# Patient Record
Sex: Female | Born: 1945 | Race: White | Hispanic: No | Marital: Married | Smoking: Never smoker
Health system: Southern US, Community
[De-identification: ages and names within clinical notes are randomized; demographics above are authoritative.]

## PROBLEM LIST (undated history)

## (undated) DIAGNOSIS — D126 Benign neoplasm of colon, unspecified: Secondary | ICD-10-CM

## (undated) DIAGNOSIS — T7840XA Allergy, unspecified, initial encounter: Secondary | ICD-10-CM

## (undated) DIAGNOSIS — E785 Hyperlipidemia, unspecified: Secondary | ICD-10-CM

## (undated) DIAGNOSIS — M1990 Unspecified osteoarthritis, unspecified site: Secondary | ICD-10-CM

## (undated) DIAGNOSIS — H269 Unspecified cataract: Secondary | ICD-10-CM

## (undated) DIAGNOSIS — D649 Anemia, unspecified: Secondary | ICD-10-CM

## (undated) DIAGNOSIS — G4700 Insomnia, unspecified: Secondary | ICD-10-CM

## (undated) DIAGNOSIS — E559 Vitamin D deficiency, unspecified: Secondary | ICD-10-CM

## (undated) DIAGNOSIS — M19019 Primary osteoarthritis, unspecified shoulder: Secondary | ICD-10-CM

## (undated) DIAGNOSIS — K648 Other hemorrhoids: Secondary | ICD-10-CM

## (undated) HISTORY — DX: Unspecified osteoarthritis, unspecified site: M19.90

## (undated) HISTORY — DX: Hyperlipidemia, unspecified: E78.5

## (undated) HISTORY — DX: Other hemorrhoids: K64.8

## (undated) HISTORY — DX: Benign neoplasm of colon, unspecified: D12.6

## (undated) HISTORY — PX: COLONOSCOPY: SHX174

## (undated) HISTORY — DX: Primary osteoarthritis, unspecified shoulder: M19.019

## (undated) HISTORY — PX: FOOT SURGERY: SHX648

## (undated) HISTORY — DX: Anemia, unspecified: D64.9

## (undated) HISTORY — PX: WISDOM TOOTH EXTRACTION: SHX21

## (undated) HISTORY — DX: Unspecified cataract: H26.9

## (undated) HISTORY — DX: Allergy, unspecified, initial encounter: T78.40XA

## (undated) HISTORY — DX: Insomnia, unspecified: G47.00

## (undated) HISTORY — PX: OOPHORECTOMY: SHX86

## (undated) HISTORY — PX: SHOULDER ARTHROSCOPY: SHX128

## (undated) HISTORY — DX: Vitamin D deficiency, unspecified: E55.9

## (undated) HISTORY — PX: POLYPECTOMY: SHX149

---
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
## 1997-07-08 ENCOUNTER — Other Ambulatory Visit: Admission: RE | Admit: 1997-07-08 | Discharge: 1997-07-08 | Payer: Self-pay | Admitting: Obstetrics & Gynecology

## 1997-10-14 ENCOUNTER — Other Ambulatory Visit: Admission: RE | Admit: 1997-10-14 | Discharge: 1997-10-14 | Payer: Self-pay | Admitting: Obstetrics & Gynecology

## 1998-03-18 ENCOUNTER — Other Ambulatory Visit: Admission: RE | Admit: 1998-03-18 | Discharge: 1998-03-18 | Payer: Self-pay | Admitting: Obstetrics & Gynecology

## 1998-11-03 ENCOUNTER — Other Ambulatory Visit: Admission: RE | Admit: 1998-11-03 | Discharge: 1998-11-03 | Payer: Self-pay | Admitting: Obstetrics & Gynecology

## 1999-05-04 ENCOUNTER — Other Ambulatory Visit: Admission: RE | Admit: 1999-05-04 | Discharge: 1999-05-04 | Payer: Self-pay | Admitting: Obstetrics & Gynecology

## 1999-07-12 ENCOUNTER — Encounter (INDEPENDENT_AMBULATORY_CARE_PROVIDER_SITE_OTHER): Payer: Self-pay | Admitting: Specialist

## 1999-07-12 ENCOUNTER — Ambulatory Visit (HOSPITAL_COMMUNITY): Admission: RE | Admit: 1999-07-12 | Discharge: 1999-07-12 | Payer: Self-pay | Admitting: Obstetrics & Gynecology

## 1999-11-16 ENCOUNTER — Other Ambulatory Visit: Admission: RE | Admit: 1999-11-16 | Discharge: 1999-11-16 | Payer: Self-pay | Admitting: *Deleted

## 1999-11-22 ENCOUNTER — Ambulatory Visit (HOSPITAL_COMMUNITY): Admission: RE | Admit: 1999-11-22 | Discharge: 1999-11-22 | Payer: Self-pay | Admitting: Gastroenterology

## 2000-06-06 ENCOUNTER — Other Ambulatory Visit: Admission: RE | Admit: 2000-06-06 | Discharge: 2000-06-06 | Payer: Self-pay | Admitting: *Deleted

## 2001-06-05 ENCOUNTER — Other Ambulatory Visit: Admission: RE | Admit: 2001-06-05 | Discharge: 2001-06-05 | Payer: Self-pay | Admitting: Internal Medicine

## 2002-06-12 ENCOUNTER — Encounter: Admission: RE | Admit: 2002-06-12 | Discharge: 2002-06-12 | Payer: Self-pay

## 2002-06-13 ENCOUNTER — Encounter (INDEPENDENT_AMBULATORY_CARE_PROVIDER_SITE_OTHER): Payer: Self-pay | Admitting: Specialist

## 2002-06-13 ENCOUNTER — Ambulatory Visit (HOSPITAL_COMMUNITY): Admission: RE | Admit: 2002-06-13 | Discharge: 2002-06-13 | Payer: Self-pay | Admitting: Obstetrics & Gynecology

## 2003-08-14 ENCOUNTER — Other Ambulatory Visit: Admission: RE | Admit: 2003-08-14 | Discharge: 2003-08-14 | Payer: Self-pay | Admitting: Obstetrics and Gynecology

## 2005-02-10 ENCOUNTER — Ambulatory Visit: Payer: Self-pay | Admitting: Internal Medicine

## 2005-02-18 ENCOUNTER — Ambulatory Visit: Payer: Self-pay | Admitting: Internal Medicine

## 2005-03-15 ENCOUNTER — Ambulatory Visit: Payer: Self-pay | Admitting: Internal Medicine

## 2005-04-04 ENCOUNTER — Ambulatory Visit: Payer: Self-pay | Admitting: Internal Medicine

## 2005-04-18 HISTORY — PX: TOTAL ABDOMINAL HYSTERECTOMY W/ BILATERAL SALPINGOOPHORECTOMY: SHX83

## 2006-02-13 ENCOUNTER — Ambulatory Visit: Payer: Self-pay | Admitting: Internal Medicine

## 2006-02-13 LAB — CONVERTED CEMR LAB
ALT: 35 units/L (ref 0–40)
AST: 34 units/L (ref 0–37)
Albumin: 4.2 g/dL (ref 3.5–5.2)
Alkaline Phosphatase: 97 units/L (ref 39–117)
BUN: 15 mg/dL (ref 6–23)
Basophils Absolute: 0 10*3/uL (ref 0.0–0.1)
Basophils Relative: 0.5 % (ref 0.0–1.0)
CO2: 29 meq/L (ref 19–32)
Calcium: 9.6 mg/dL (ref 8.4–10.5)
Chloride: 106 meq/L (ref 96–112)
Chol/HDL Ratio, serum: 2.9
Cholesterol: 232 mg/dL (ref 0–200)
Creatinine, Ser: 1 mg/dL (ref 0.4–1.2)
Eosinophil percent: 2.5 % (ref 0.0–5.0)
GFR calc non Af Amer: 61 mL/min
Glomerular Filtration Rate, Af Am: 73 mL/min/{1.73_m2}
Glucose, Bld: 81 mg/dL (ref 70–99)
HCT: 40.9 % (ref 36.0–46.0)
HDL: 81.2 mg/dL (ref >39.0)
Hemoglobin: 13.7 g/dL (ref 12.0–15.0)
LDL DIRECT: 119.9 mg/dL
Lymphocytes Relative: 37.3 % (ref 12.0–46.0)
MCHC: 33.4 g/dL (ref 30.0–36.0)
MCV: 93.8 fL (ref 78.0–100.0)
Monocytes Absolute: 0.4 10*3/uL (ref 0.2–0.7)
Monocytes Relative: 12.8 % — ABNORMAL HIGH (ref 3.0–11.0)
Neutro Abs: 1.5 10*3/uL (ref 1.4–7.7)
Neutrophils Relative %: 46.9 % (ref 43.0–77.0)
Platelets: 283 10*3/uL (ref 150–400)
Potassium: 4.2 meq/L (ref 3.5–5.1)
RBC: 4.36 M/uL (ref 3.87–5.11)
RDW: 12.2 % (ref 11.5–14.6)
Sodium: 142 meq/L (ref 135–145)
TSH: 1.33 microintl units/mL (ref 0.35–5.50)
Total Bilirubin: 0.8 mg/dL (ref 0.3–1.2)
Total Protein: 6.8 g/dL (ref 6.0–8.3)
Triglyceride fasting, serum: 53 mg/dL (ref 0–149)
VLDL: 11 mg/dL (ref 0–40)
WBC: 3.2 10*3/uL — ABNORMAL LOW (ref 4.5–10.5)

## 2006-02-23 ENCOUNTER — Ambulatory Visit: Payer: Self-pay | Admitting: Internal Medicine

## 2006-03-30 ENCOUNTER — Ambulatory Visit: Payer: Self-pay | Admitting: Internal Medicine

## 2006-11-29 ENCOUNTER — Telehealth (INDEPENDENT_AMBULATORY_CARE_PROVIDER_SITE_OTHER): Payer: Self-pay | Admitting: *Deleted

## 2007-02-22 ENCOUNTER — Encounter: Payer: Self-pay | Admitting: Internal Medicine

## 2007-03-19 ENCOUNTER — Ambulatory Visit: Payer: Self-pay | Admitting: Internal Medicine

## 2007-03-19 LAB — CONVERTED CEMR LAB
ALT: 45 units/L — ABNORMAL HIGH (ref 0–35)
AST: 40 units/L — ABNORMAL HIGH (ref 0–37)
Albumin: 4.1 g/dL (ref 3.5–5.2)
Alkaline Phosphatase: 81 units/L (ref 39–117)
BUN: 16 mg/dL (ref 6–23)
Basophils Absolute: 0 10*3/uL (ref 0.0–0.1)
Basophils Relative: 0.3 % (ref 0.0–1.0)
Bilirubin Urine: NEGATIVE
Bilirubin, Direct: 0.1 mg/dL (ref 0.0–0.3)
CO2: 27 meq/L (ref 19–32)
Calcium: 9.7 mg/dL (ref 8.4–10.5)
Chloride: 105 meq/L (ref 96–112)
Cholesterol: 199 mg/dL (ref 0–200)
Creatinine, Ser: 1 mg/dL (ref 0.4–1.2)
Eosinophils Absolute: 0.1 10*3/uL (ref 0.0–0.6)
Eosinophils Relative: 1.8 % (ref 0.0–5.0)
GFR calc Af Amer: 73 mL/min
GFR calc non Af Amer: 60 mL/min
Glucose, Bld: 93 mg/dL (ref 70–99)
Glucose, Urine, Semiquant: 100
HCT: 38.6 % (ref 36.0–46.0)
HDL: 81.8 mg/dL (ref >39.0)
Hemoglobin: 13.3 g/dL (ref 12.0–15.0)
Ketones, urine, test strip: NEGATIVE
LDL Cholesterol: 103 mg/dL — ABNORMAL HIGH (ref 0–99)
Lymphocytes Relative: 35.8 % (ref 12.0–46.0)
MCHC: 34.4 g/dL (ref 30.0–36.0)
MCV: 94.2 fL (ref 78.0–100.0)
Monocytes Absolute: 0.5 10*3/uL (ref 0.2–0.7)
Monocytes Relative: 11.5 % — ABNORMAL HIGH (ref 3.0–11.0)
Neutro Abs: 2.1 10*3/uL (ref 1.4–7.7)
Neutrophils Relative %: 50.6 % (ref 43.0–77.0)
Nitrite: NEGATIVE
Platelets: 246 10*3/uL (ref 150–400)
Potassium: 4.5 meq/L (ref 3.5–5.1)
Protein, U semiquant: NEGATIVE
RBC: 4.09 M/uL (ref 3.87–5.11)
RDW: 12.3 % (ref 11.5–14.6)
Sodium: 141 meq/L (ref 135–145)
Specific Gravity, Urine: 1.025
TSH: 2.57 microintl units/mL (ref 0.35–5.50)
Total Bilirubin: 0.5 mg/dL (ref 0.3–1.2)
Total CHOL/HDL Ratio: 2.4
Total Protein: 6.4 g/dL (ref 6.0–8.3)
Triglycerides: 71 mg/dL (ref 0–149)
Urobilinogen, UA: 0.2
VLDL: 14 mg/dL (ref 0–40)
Vit D, 1,25-Dihydroxy: 63 (ref 30–89)
WBC Urine, dipstick: NEGATIVE
WBC: 4.2 10*3/uL — ABNORMAL LOW (ref 4.5–10.5)
pH: 6

## 2007-04-06 ENCOUNTER — Ambulatory Visit: Payer: Self-pay | Admitting: Internal Medicine

## 2007-04-06 DIAGNOSIS — R945 Abnormal results of liver function studies: Secondary | ICD-10-CM | POA: Insufficient documentation

## 2007-04-06 LAB — CONVERTED CEMR LAB
ALT: 37 units/L — ABNORMAL HIGH (ref 0–35)
AST: 29 units/L (ref 0–37)
Albumin: 4.4 g/dL (ref 3.5–5.2)
Alkaline Phosphatase: 90 units/L (ref 39–117)
Bilirubin, Direct: 0.1 mg/dL (ref 0.0–0.3)
Ceruloplasmin: 31 mg/dL (ref 21–63)
HCV Ab: NEGATIVE
Hep A IgM: NEGATIVE
Hep B C IgM: NEGATIVE
Hepatitis B Surface Ag: NEGATIVE
Indirect Bilirubin: 0.3 mg/dL (ref 0.0–0.9)
Iron: 94 ug/dL (ref 42–145)
Saturation Ratios: 26 % (ref 20–55)
TIBC: 356 ug/dL (ref 250–470)
Total Bilirubin: 0.4 mg/dL (ref 0.3–1.2)
Total Protein: 6.8 g/dL (ref 6.0–8.3)
Transferrin: 257 mg/dL (ref 212–360)
UIBC: 262 ug/dL

## 2007-04-10 ENCOUNTER — Telehealth: Payer: Self-pay | Admitting: Internal Medicine

## 2007-05-14 ENCOUNTER — Telehealth: Payer: Self-pay | Admitting: Internal Medicine

## 2007-05-15 ENCOUNTER — Ambulatory Visit: Payer: Self-pay | Admitting: Internal Medicine

## 2007-05-15 DIAGNOSIS — G4700 Insomnia, unspecified: Secondary | ICD-10-CM | POA: Insufficient documentation

## 2007-11-02 ENCOUNTER — Telehealth: Payer: Self-pay | Admitting: Internal Medicine

## 2007-11-12 ENCOUNTER — Telehealth: Payer: Self-pay | Admitting: *Deleted

## 2007-11-16 ENCOUNTER — Encounter: Payer: Self-pay | Admitting: Internal Medicine

## 2007-12-06 ENCOUNTER — Telehealth: Payer: Self-pay | Admitting: Internal Medicine

## 2007-12-10 ENCOUNTER — Ambulatory Visit: Payer: Self-pay | Admitting: Internal Medicine

## 2007-12-11 ENCOUNTER — Ambulatory Visit: Payer: Self-pay | Admitting: Internal Medicine

## 2007-12-11 DIAGNOSIS — E785 Hyperlipidemia, unspecified: Secondary | ICD-10-CM | POA: Insufficient documentation

## 2007-12-11 DIAGNOSIS — E7800 Pure hypercholesterolemia, unspecified: Secondary | ICD-10-CM | POA: Insufficient documentation

## 2007-12-11 DIAGNOSIS — N951 Menopausal and female climacteric states: Secondary | ICD-10-CM | POA: Insufficient documentation

## 2007-12-20 ENCOUNTER — Ambulatory Visit: Payer: Self-pay | Admitting: Internal Medicine

## 2007-12-20 LAB — HM COLONOSCOPY

## 2008-01-28 ENCOUNTER — Telehealth: Payer: Self-pay | Admitting: Internal Medicine

## 2008-02-07 ENCOUNTER — Ambulatory Visit: Payer: Self-pay | Admitting: Internal Medicine

## 2008-02-07 LAB — CONVERTED CEMR LAB
ALT: 28 units/L (ref 0–35)
AST: 32 units/L (ref 0–37)
Albumin: 4.1 g/dL (ref 3.5–5.2)
Alkaline Phosphatase: 79 units/L (ref 39–117)
Bilirubin, Direct: 0.2 mg/dL (ref 0.0–0.3)
CRP, High Sensitivity: <1 — ABNORMAL LOW (ref 0.00–5.00)
Cholesterol: 189 mg/dL (ref 0–200)
Estradiol: 42.1 pg/mL
HDL: 89 mg/dL (ref >39.0)
LDL Cholesterol: 89 mg/dL (ref 0–99)
Total Bilirubin: 1.1 mg/dL (ref 0.3–1.2)
Total CHOL/HDL Ratio: 2.1
Total Protein: 6.6 g/dL (ref 6.0–8.3)
Triglycerides: 54 mg/dL (ref 0–149)
VLDL: 11 mg/dL (ref 0–40)

## 2008-02-14 ENCOUNTER — Ambulatory Visit: Payer: Self-pay | Admitting: Internal Medicine

## 2008-03-03 ENCOUNTER — Encounter: Payer: Self-pay | Admitting: Internal Medicine

## 2008-03-06 ENCOUNTER — Encounter: Payer: Self-pay | Admitting: Internal Medicine

## 2008-03-06 ENCOUNTER — Telehealth: Payer: Self-pay | Admitting: Internal Medicine

## 2008-03-27 ENCOUNTER — Telehealth: Payer: Self-pay | Admitting: *Deleted

## 2008-04-22 ENCOUNTER — Telehealth: Payer: Self-pay | Admitting: Internal Medicine

## 2008-04-25 ENCOUNTER — Telehealth: Payer: Self-pay | Admitting: Internal Medicine

## 2008-04-25 DIAGNOSIS — G4700 Insomnia, unspecified: Secondary | ICD-10-CM | POA: Insufficient documentation

## 2008-05-13 ENCOUNTER — Ambulatory Visit (HOSPITAL_BASED_OUTPATIENT_CLINIC_OR_DEPARTMENT_OTHER): Admission: RE | Admit: 2008-05-13 | Discharge: 2008-05-13 | Payer: Self-pay | Admitting: Internal Medicine

## 2008-05-13 ENCOUNTER — Encounter: Payer: Self-pay | Admitting: Internal Medicine

## 2008-05-16 ENCOUNTER — Ambulatory Visit: Payer: Self-pay | Admitting: Pulmonary Disease

## 2008-05-21 ENCOUNTER — Ambulatory Visit: Payer: Self-pay | Admitting: Internal Medicine

## 2008-06-16 ENCOUNTER — Telehealth: Payer: Self-pay | Admitting: Internal Medicine

## 2008-06-26 ENCOUNTER — Telehealth: Payer: Self-pay | Admitting: Internal Medicine

## 2008-07-09 ENCOUNTER — Ambulatory Visit: Payer: Self-pay | Admitting: Internal Medicine

## 2008-08-18 ENCOUNTER — Telehealth: Payer: Self-pay | Admitting: Internal Medicine

## 2008-09-02 ENCOUNTER — Ambulatory Visit: Payer: Self-pay | Admitting: Internal Medicine

## 2008-09-02 LAB — CONVERTED CEMR LAB
ALT: 28 units/L (ref 0–35)
AST: 29 units/L (ref 0–37)
Albumin: 3.8 g/dL (ref 3.5–5.2)
Alkaline Phosphatase: 65 units/L (ref 39–117)
Bilirubin, Direct: 0 mg/dL (ref 0.0–0.3)
Cholesterol: 216 mg/dL — ABNORMAL HIGH (ref 0–200)
Direct LDL: 116 mg/dL
HDL: 78 mg/dL (ref >39.00)
Total Bilirubin: 0.7 mg/dL (ref 0.3–1.2)
Total CHOL/HDL Ratio: 3
Total Protein: 6.6 g/dL (ref 6.0–8.3)
Triglycerides: 66 mg/dL (ref 0.0–149.0)
VLDL: 13.2 mg/dL (ref 0.0–40.0)

## 2008-09-05 LAB — CONVERTED CEMR LAB: Vit D, 25-Hydroxy: 58 ng/mL (ref 30–89)

## 2008-09-16 ENCOUNTER — Ambulatory Visit: Payer: Self-pay | Admitting: Internal Medicine

## 2008-09-16 LAB — CONVERTED CEMR LAB
Cholesterol, target level: 200 mg/dL
HDL goal, serum: 40 mg/dL
LDL Goal: 160 mg/dL

## 2008-11-25 ENCOUNTER — Telehealth: Payer: Self-pay | Admitting: *Deleted

## 2009-01-19 ENCOUNTER — Telehealth: Payer: Self-pay | Admitting: Internal Medicine

## 2009-03-03 ENCOUNTER — Ambulatory Visit: Payer: Self-pay | Admitting: Internal Medicine

## 2009-03-03 LAB — CONVERTED CEMR LAB
ALT: 27 units/L (ref 0–35)
AST: 26 units/L (ref 0–37)
Albumin: 4.2 g/dL (ref 3.5–5.2)
Alkaline Phosphatase: 86 units/L (ref 39–117)
BUN: 17 mg/dL (ref 6–23)
Basophils Absolute: 0 10*3/uL (ref 0.0–0.1)
Basophils Relative: 0.7 % (ref 0.0–3.0)
Bilirubin Urine: NEGATIVE
Bilirubin, Direct: 0 mg/dL (ref 0.0–0.3)
CO2: 27 meq/L (ref 19–32)
Calcium: 9.7 mg/dL (ref 8.4–10.5)
Chloride: 104 meq/L (ref 96–112)
Cholesterol: 278 mg/dL — ABNORMAL HIGH (ref 0–200)
Creatinine, Ser: 1 mg/dL (ref 0.4–1.2)
Direct LDL: 159.7 mg/dL
Eosinophils Absolute: 0 10*3/uL (ref 0.0–0.7)
Eosinophils Relative: 1.5 % (ref 0.0–5.0)
GFR calc non Af Amer: 59.73 mL/min (ref >60)
Glucose, Bld: 83 mg/dL (ref 70–99)
Glucose, Urine, Semiquant: NEGATIVE
HCT: 39.7 % (ref 36.0–46.0)
HDL: 102 mg/dL (ref >39.00)
Hemoglobin: 13.5 g/dL (ref 12.0–15.0)
Ketones, urine, test strip: NEGATIVE
Lymphocytes Relative: 32.8 % (ref 12.0–46.0)
Lymphs Abs: 1.1 10*3/uL (ref 0.7–4.0)
MCHC: 33.9 g/dL (ref 30.0–36.0)
MCV: 96.8 fL (ref 78.0–100.0)
Monocytes Absolute: 0.4 10*3/uL (ref 0.1–1.0)
Monocytes Relative: 13.1 % — ABNORMAL HIGH (ref 3.0–12.0)
Neutro Abs: 1.8 10*3/uL (ref 1.4–7.7)
Neutrophils Relative %: 51.9 % (ref 43.0–77.0)
Nitrite: NEGATIVE
Platelets: 252 10*3/uL (ref 150.0–400.0)
Potassium: 4.3 meq/L (ref 3.5–5.1)
Protein, U semiquant: NEGATIVE
RBC: 4.1 M/uL (ref 3.87–5.11)
RDW: 12.9 % (ref 11.5–14.6)
Sodium: 140 meq/L (ref 135–145)
Specific Gravity, Urine: 1.015
TSH: 1.31 microintl units/mL (ref 0.35–5.50)
Total Bilirubin: 1.1 mg/dL (ref 0.3–1.2)
Total CHOL/HDL Ratio: 3
Total Protein: 7.1 g/dL (ref 6.0–8.3)
Triglycerides: 55 mg/dL (ref 0.0–149.0)
Urobilinogen, UA: 0.2
VLDL: 11 mg/dL (ref 0.0–40.0)
WBC Urine, dipstick: NEGATIVE
WBC: 3.3 10*3/uL — ABNORMAL LOW (ref 4.5–10.5)
pH: 7

## 2009-03-04 ENCOUNTER — Encounter: Payer: Self-pay | Admitting: Internal Medicine

## 2009-03-04 LAB — HM MAMMOGRAPHY: HM Mammogram: NORMAL

## 2009-03-05 ENCOUNTER — Ambulatory Visit: Payer: Self-pay | Admitting: Internal Medicine

## 2009-03-09 ENCOUNTER — Encounter (INDEPENDENT_AMBULATORY_CARE_PROVIDER_SITE_OTHER): Payer: Self-pay | Admitting: *Deleted

## 2009-08-11 ENCOUNTER — Encounter: Payer: Self-pay | Admitting: Internal Medicine

## 2009-08-24 ENCOUNTER — Telehealth: Payer: Self-pay | Admitting: Internal Medicine

## 2009-08-26 ENCOUNTER — Encounter: Payer: Self-pay | Admitting: Internal Medicine

## 2009-09-04 ENCOUNTER — Ambulatory Visit: Payer: Self-pay | Admitting: Internal Medicine

## 2009-09-04 DIAGNOSIS — J309 Allergic rhinitis, unspecified: Secondary | ICD-10-CM | POA: Insufficient documentation

## 2009-09-04 DIAGNOSIS — K5900 Constipation, unspecified: Secondary | ICD-10-CM | POA: Insufficient documentation

## 2009-09-04 DIAGNOSIS — R413 Other amnesia: Secondary | ICD-10-CM | POA: Insufficient documentation

## 2009-09-04 LAB — CONVERTED CEMR LAB
ALT: 26 units/L (ref 0–35)
AST: 24 units/L (ref 0–37)
Albumin: 4.2 g/dL (ref 3.5–5.2)
Alkaline Phosphatase: 100 units/L (ref 39–117)
Basophils Absolute: 0 10*3/uL (ref 0.0–0.1)
Basophils Relative: 0.6 % (ref 0.0–3.0)
Bilirubin, Direct: 0.1 mg/dL (ref 0.0–0.3)
Calcium: 9.5 mg/dL (ref 8.4–10.5)
Cholesterol: 293 mg/dL — ABNORMAL HIGH (ref 0–200)
Direct LDL: 193.9 mg/dL
Eosinophils Absolute: 0 10*3/uL (ref 0.0–0.7)
Eosinophils Relative: 1.1 % (ref 0.0–5.0)
HCT: 40.3 % (ref 36.0–46.0)
HDL: 101.2 mg/dL (ref >39.00)
Hemoglobin: 13.9 g/dL (ref 12.0–15.0)
Lymphocytes Relative: 26.8 % (ref 12.0–46.0)
Lymphs Abs: 1.1 10*3/uL (ref 0.7–4.0)
MCHC: 34.5 g/dL (ref 30.0–36.0)
MCV: 93.7 fL (ref 78.0–100.0)
Monocytes Absolute: 0.4 10*3/uL (ref 0.1–1.0)
Monocytes Relative: 9.3 % (ref 3.0–12.0)
Neutro Abs: 2.5 10*3/uL (ref 1.4–7.7)
Neutrophils Relative %: 62.2 % (ref 43.0–77.0)
Platelets: 294 10*3/uL (ref 150.0–400.0)
RBC: 4.3 M/uL (ref 3.87–5.11)
RDW: 13.7 % (ref 11.5–14.6)
TSH: 1.16 microintl units/mL (ref 0.35–5.50)
Total Bilirubin: 0.7 mg/dL (ref 0.3–1.2)
Total Protein: 6.8 g/dL (ref 6.0–8.3)
Vit D, 25-Hydroxy: 43 ng/mL (ref 30–89)
WBC: 4.1 10*3/uL — ABNORMAL LOW (ref 4.5–10.5)

## 2009-09-29 ENCOUNTER — Telehealth: Payer: Self-pay | Admitting: Internal Medicine

## 2009-12-29 ENCOUNTER — Ambulatory Visit: Payer: Self-pay | Admitting: Sports Medicine

## 2009-12-29 DIAGNOSIS — M19011 Primary osteoarthritis, right shoulder: Secondary | ICD-10-CM | POA: Insufficient documentation

## 2009-12-29 DIAGNOSIS — M25569 Pain in unspecified knee: Secondary | ICD-10-CM | POA: Insufficient documentation

## 2010-02-09 ENCOUNTER — Ambulatory Visit: Payer: Self-pay | Admitting: Sports Medicine

## 2010-02-09 ENCOUNTER — Encounter: Admission: RE | Admit: 2010-02-09 | Discharge: 2010-02-09 | Payer: Self-pay | Admitting: Sports Medicine

## 2010-02-10 ENCOUNTER — Encounter (INDEPENDENT_AMBULATORY_CARE_PROVIDER_SITE_OTHER): Payer: Self-pay | Admitting: *Deleted

## 2010-02-11 ENCOUNTER — Encounter: Payer: Self-pay | Admitting: Internal Medicine

## 2010-02-19 ENCOUNTER — Encounter: Payer: Self-pay | Admitting: Sports Medicine

## 2010-02-23 ENCOUNTER — Ambulatory Visit: Payer: Self-pay | Admitting: Internal Medicine

## 2010-02-23 LAB — CONVERTED CEMR LAB
ALT: 42 units/L — ABNORMAL HIGH (ref 0–35)
AST: 32 units/L (ref 0–37)
Albumin: 4 g/dL (ref 3.5–5.2)
Alkaline Phosphatase: 92 units/L (ref 39–117)
BUN: 20 mg/dL (ref 6–23)
Basophils Absolute: 0 10*3/uL (ref 0.0–0.1)
Basophils Relative: 0.3 % (ref 0.0–3.0)
Bilirubin Urine: NEGATIVE
Bilirubin, Direct: 0.1 mg/dL (ref 0.0–0.3)
Blood in Urine, dipstick: NEGATIVE
CO2: 28 meq/L (ref 19–32)
Calcium: 9.5 mg/dL (ref 8.4–10.5)
Chloride: 105 meq/L (ref 96–112)
Cholesterol: 269 mg/dL — ABNORMAL HIGH (ref 0–200)
Creatinine, Ser: 0.7 mg/dL (ref 0.4–1.2)
Direct LDL: 147.5 mg/dL
Eosinophils Absolute: 0 10*3/uL (ref 0.0–0.7)
Eosinophils Relative: 1 % (ref 0.0–5.0)
GFR calc non Af Amer: 89.85 mL/min (ref >60)
Glucose, Bld: 74 mg/dL (ref 70–99)
Glucose, Urine, Semiquant: NEGATIVE
HCT: 36.5 % (ref 36.0–46.0)
HDL: 99.3 mg/dL (ref >39.00)
Hemoglobin: 12.6 g/dL (ref 12.0–15.0)
Ketones, urine, test strip: NEGATIVE
Lymphocytes Relative: 32 % (ref 12.0–46.0)
Lymphs Abs: 1.2 10*3/uL (ref 0.7–4.0)
MCHC: 34.5 g/dL (ref 30.0–36.0)
MCV: 93.2 fL (ref 78.0–100.0)
Monocytes Absolute: 0.5 10*3/uL (ref 0.1–1.0)
Monocytes Relative: 12.4 % — ABNORMAL HIGH (ref 3.0–12.0)
Neutro Abs: 2.1 10*3/uL (ref 1.4–7.7)
Neutrophils Relative %: 54.3 % (ref 43.0–77.0)
Nitrite: NEGATIVE
Platelets: 265 10*3/uL (ref 150.0–400.0)
Potassium: 4.1 meq/L (ref 3.5–5.1)
Protein, U semiquant: NEGATIVE
RBC: 3.92 M/uL (ref 3.87–5.11)
RDW: 13.5 % (ref 11.5–14.6)
Sodium: 140 meq/L (ref 135–145)
Specific Gravity, Urine: 1.015
TSH: 0.91 microintl units/mL (ref 0.35–5.50)
Total Bilirubin: 0.7 mg/dL (ref 0.3–1.2)
Total CHOL/HDL Ratio: 3
Total Protein: 6.1 g/dL (ref 6.0–8.3)
Triglycerides: 69 mg/dL (ref 0.0–149.0)
Urobilinogen, UA: 0.2
VLDL: 13.8 mg/dL (ref 0.0–40.0)
Vit D, 25-Hydroxy: 58 ng/mL (ref 30–89)
WBC Urine, dipstick: NEGATIVE
WBC: 3.8 10*3/uL — ABNORMAL LOW (ref 4.5–10.5)
pH: 7

## 2010-03-19 ENCOUNTER — Ambulatory Visit: Payer: Self-pay | Admitting: Internal Medicine

## 2010-03-24 ENCOUNTER — Telehealth: Payer: Self-pay | Admitting: Internal Medicine

## 2010-03-26 ENCOUNTER — Encounter: Payer: Self-pay | Admitting: Internal Medicine

## 2010-03-26 ENCOUNTER — Ambulatory Visit: Payer: Self-pay | Admitting: Internal Medicine

## 2010-05-04 ENCOUNTER — Telehealth: Payer: Self-pay | Admitting: Internal Medicine

## 2010-05-07 ENCOUNTER — Ambulatory Visit
Admission: RE | Admit: 2010-05-07 | Discharge: 2010-05-07 | Payer: Self-pay | Source: Home / Self Care | Attending: Internal Medicine | Admitting: Internal Medicine

## 2010-05-07 DIAGNOSIS — M899 Disorder of bone, unspecified: Secondary | ICD-10-CM | POA: Insufficient documentation

## 2010-05-07 DIAGNOSIS — M949 Disorder of cartilage, unspecified: Secondary | ICD-10-CM | POA: Insufficient documentation

## 2010-05-18 NOTE — Progress Notes (Signed)
Summary: does she need to come for her appt tomorrow   Phone Note Call from Patient   Caller: patient triage message Call For: jenkins Summary of Call: Has a follow up appt tomorrow.  She is doing well on the Melatonin.  She is sleeping much better.  About twice a week she takes a  clonizapam when she cannot slepp.  Should she come to her appt.  She wants an rx for the melatonin and an rx for the clonizapam. [facility] [phone] to let her know if she needs to come and if she does not need to come cancel the appt.  Initial call taken by: Roselle Locus,  May 14, 2007 9:12 AM  Follow-up for Phone Call        best to come in and let me document the success of the drus and give her an rx for one year Follow-up by: Stacie Glaze MD,  May 14, 2007 1:21 PM  Additional Follow-up for Phone Call Additional follow up Details #1::        Pt. notified to keep appt. Additional Follow-up by: Lynann Beaver CMA,  May 14, 2007 1:44 PM

## 2010-05-18 NOTE — Consult Note (Signed)
Summary: [facility]  [facility]   Imported By: Marily Memos 02/22/2010 13:41:10  _____________________________________________________________________  External Attachment:    Type:   Image     Comment:   External Document

## 2010-05-18 NOTE — Progress Notes (Signed)
Summary: SHOT INFO  Phone Note Call from Patient Call back at Home Phone [phone] Call back at [phone]   Caller: Patient-LIVE CALL Reason for Call: Acute Illness, Talk to Nurse Summary of Call: NEED COPIES OF SHOT RECORD AND LAST MAMMOGRAM. PLEASE FAX TO [phone] C/O RICHARD Knoth. PLEASE CALL PT WHEN READY. Initial call taken by: Warnell Forester,  November 25, 2008 12:14 PM  Follow-up for Phone Call        pt informed no shot record availbe- ion paper chart it has that she states she is up to date on immunizations when questioned.  also I suggested she get copy of mammogram from [facility] where it was done- pt aggreed Follow-up by: Willy Eddy, LPN,  November 25, 2008 2:23 PM

## 2010-05-18 NOTE — Assessment & Plan Note (Signed)
Summary: 6 mo rov/mm   Vital Signs:  Patient profile:   65 year old female Height:      65.5 inches Weight:      122 pounds BMI:     20.07 Temp:     98.2 degrees F oral Pulse rate:   72 / minute Resp:     14 per minute BP sitting:   110 / 70  (left arm)  Vitals Entered By: Willy Eddy, LPN (Sep 04, 2009 8:15 AM) CC: roa   CC:  roa.  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 1 Once Daily 2)  Sonata 10 Mg  Caps (Zaleplon) .... One By Mouth Q Hs As Needed Insomnia. 3)  Magnesium .Marland Kitchen.. 800/day 4)  Omega-3 350 Mg Caps (Omega-3 Fatty Acids) .... 3 Per Day 5)  Grenlip Muscle Supplement For Osteo Arthritis .Marland Kitchen.. 1 Once Daily 6)  Niacinamide 100 Mg Tabs (Niacinamide) .Marland Kitchen.. 1 Once Daily 7)  Ambien 10 Mg Tabs (Zolpidem Tartrate) .Marland Kitchen.. 1 At Bedtime As Needed Sleep 8)  Bromellian .... For Shoulder  Allergies (verified): 1)  ! Penicillin  Past History:  Family History: Last updated: 11/09/2006 Family History Hypertension Family History Other cancer-Breast Family History Ovarian cancer Family History of Stroke F 1st degree relative <60 Family History Uterine cancer Fam hx  MI Fam hx CHF  Social History: Last updated: 11/09/2006 Never Smoked Alcohol use-yes Drug use-no  Risk Factors: Smoking Status: never (09/04/2009)  Past medical, surgical, family and social histories (including risk factors) reviewed, and no changes noted (except as noted below).  Past Medical History: Reviewed history from 04/06/2007 and no changes required. Unremarkable  Past Surgical History: Reviewed history from 04/06/2007 and no changes required. Toe  Family History: Reviewed history from 11/09/2006 and no changes required. Family History Hypertension Family History Other cancer-Breast Family History Ovarian cancer Family History of Stroke F 1st degree relative <60 Family History Uterine cancer Fam hx MI Fam hx CHF  Social History: Reviewed history from 11/09/2006 and no changes required. Never Smoked Alcohol use-yes Drug use-no  Review of Systems  The patient denies anorexia, fever, weight loss, weight gain, vision loss, decreased hearing, hoarseness, chest pain, syncope, dyspnea on exertion, peripheral edema, prolonged cough, headaches, hemoptysis, abdominal pain, melena, hematochezia, severe indigestion/heartburn, hematuria, incontinence, genital sores, muscle weakness, suspicious skin lesions, transient blindness, difficulty walking, depression, unusual weight change, abnormal bleeding, enlarged lymph nodes, angioedema, and breast masses.    Physical Exam  General:  Well-developed,well-nourished,in no acute distress; alert,appropriate and cooperative throughout examination Head:  Normocephalic and atraumatic without obvious abnormalities. No apparent alopecia or balding. Eyes:  vision grossly intact, pupils equal, and pupils round.   Nose:  no external deformity and no nasal discharge.  Mouth:  pharynx pink and moist and no erythema.   Neck:  No deformities, masses, or tenderness noted. Lungs:  Normal respiratory effort, chest expands symmetrically. Lungs  are clear to auscultation, no crackles or wheezes. Heart:  Normal rate and regular rhythm. S1 and S2 normal without gallop, murmur, click, rub or other extra sounds. Abdomen:  Bowel sounds positive,abdomen soft and non-tender without masses, organomegaly or hernias noted.   Impression & Recommendations:  Problem # 1:  INSOMNIA (ICD-780.52) Assessment New  take the melatonin mg and zinc nightly with occasional sonata to sleep The following medications were removed from the medication list:    Unisom 25 Mg Tabs (Doxylamine succinate (sleep)) .Marland Kitchen... 1 at bedtime  as needed sleep Her updated medication list for this problem includes:    Sonata 10 Mg Caps (Zaleplon) ..... One by mouth q hs as needed insomnia.    Ambien 10 Mg Tabs (Zolpidem tartrate) .Marland Kitchen... 1 at bedtime as needed sleep  Discussed sleep hygiene.   Problem # 2:  MEMORY LOSS (ICD-780.93) worried about other causes other that depression and anxiety as the cause  Problem # 3:  CONSTIPATION, INTERMITTENT (ICD-564.00)  Discussed dietary fiber measures and increased water intake.   Orders: TLB-Hepatic/Liver Function Pnl (80076-HEPATIC)  Problem # 4:  ALLERGIC RHINITIS CAUSE UNSPECIFIED (ICD-477.9)  Orders: TLB-CBC Platelet - w/Differential (85025-CBCD)  Complete Medication List: 1)  Calcium-vitamin D 250-125 Mg-unit Tabs (Calcium carbonate-vitamin d) .Marland Kitchen.. 1 once daily 2)  Sonata 10 Mg Caps (Zaleplon) .... One by mouth q hs as needed insomnia. 3)  Magnesium  .Marland Kitchen.. 800/day 4)  Omega-3 350 Mg Caps (Omega-3 fatty acids) .... 3 per day 5)  Grenlip Muscle Supplement For Osteo Arthritis  .Marland Kitchen.. 1 once daily 6)  Niacinamide 100 Mg Tabs (Niacinamide) .Marland Kitchen.. 1 once daily 7)  Ambien 10 Mg Tabs (Zolpidem tartrate) .Marland Kitchen.. 1 at bedtime as needed sleep 8)  Bromellian  .... For shoulder  Other Orders: TLB-Cholesterol, HDL (83718-HDL) TLB-Cholesterol, Direct LDL (83721-DIRLDL) TLB-Cholesterol, Total (82465-CHO) TLB-TSH (Thyroid Stimulating  Hormone) (84443-TSH) T-Vitamin D (25-Hydroxy) (29528-41324) TLB-Calcium (82310-CA)  Patient Instructions: 1)  take the melatonin  two  hours prior to bed and use the sonata as the preferential sleeping medication with ambien as a rare back up 2)  Please schedule a follow-up appointment in 6 months for cpx

## 2010-05-18 NOTE — Progress Notes (Signed)
Summary: RX REFILL  Phone Note Call from Patient Call back at [phone]   Caller: pt live Call For: Stacie Glaze MD Summary of Call: PATIENT NEEDS RX REFILL FOR ADRINAL SUSPORT, BIEST, PROGESTERONE,PHOSPHATIDYL SERINE COMPLEX.  [facility] [phone].THE PHARMACY IS FAXING OVER A REQUEST FOR ALL 4 MEDS. Initial call taken by: Celine Ahr,  March 27, 2008 9:16 AM  Follow-up for Phone Call        gave verbal order for 3 refill Follow-up by: Willy Eddy, LPN,  March 27, 2008 12:40 PM

## 2010-05-18 NOTE — Progress Notes (Signed)
Summary: colonoscopy referral   Phone Note Call from Patient Call back at Home Phone [phone]   Caller: pt vm triage Call For: jenkins Summary of Call: Wants to have colonoscopy with Dr Aline August at [facility].  They request a phone call  from Dr Lovell Sheehan office prior to setting up the appt [phone].  Has been 8 years since last colonoscopy.  Please advise pt when set up.  She is not available the 1st 15 days of October  Initial call taken by: Roselle Locus,  December 06, 2007 2:09 PM  New Problems: PREVENTIVE HEALTH CARE (ICD-V70.0)   New Problems: PREVENTIVE HEALTH CARE (ICD-V70.0)     Appended Document: colonoscopy referral  referral sent to [facility]

## 2010-05-18 NOTE — Miscellaneous (Signed)
Summary: [facility] Previsit/prep  Clinical Lists Changes  Medications: Added new medication of METOCLOPRAMIDE HCL 10 MG  TABS (METOCLOPRAMIDE HCL) As per prep instructions. - Signed Rx of METOCLOPRAMIDE HCL 10 MG  TABS (METOCLOPRAMIDE HCL) As per prep instructions.;  #2 x 0;  Signed;  Entered by: Wyona Almas RN;  Authorized by: Hart Carwin MD;  Method used: [facility] [address] [phone]*, [address], [place], [place], [place]  [address], Ph: [phone] or [phone], Fax: [phone] Observations: Added new observation of ALLERGY REV: Done (12/10/2007 13:49)    Prescriptions: METOCLOPRAMIDE HCL 10 MG  TABS (METOCLOPRAMIDE HCL) As per prep instructions.  #2 x 0   Entered by:   Wyona Almas RN   Authorized by:   Hart Carwin MD   Signed by:   Wyona Almas RN on 12/10/2007   Method used:   [facility] to        [facility] [address] [phone]* (retail)       [address]       [place], [place]  [address]       Ph: [phone] or [phone]       Fax: [phone]   RxID:   [id number]  Pt. already has  her Miralax and Dulcolax medicine for her prep.

## 2010-05-18 NOTE — Assessment & Plan Note (Signed)
Summary: 3 month rov/njr reschedule with patient from bump/m,hf   Vital Signs:  Patient Profile:   65 Years Old Female Height:     65.5 inches Weight:      123 pounds Temp:     98.6 degrees F rectal Pulse rate:   76 / minute Resp:     12 per minute BP sitting:   110 / 70  (left arm)  Vitals Entered By: Willy Eddy, LPN (May 21, 2008 9:33 AM)                 Chief Complaint:  roa- sleep study and to discuss hormone testing.  History of Present Illness: Review of the sleep study due to significant loss of sleep efficiency Took the klonapam   and was worried that she did not have a good test She has very poor sleep efficinecy she also had the salivary test for HRT and bio identical hormones    Current Allergies: ! PENICILLIN  Past Medical History:    Reviewed history from 04/06/2007 and no changes required:       Unremarkable  Past Surgical History:    Reviewed history from 04/06/2007 and no changes required:       Toe   Family History:    Reviewed history from 11/09/2006 and no changes required:       Family History Hypertension       Family History Other cancer-Breast       Family History Ovarian cancer       Family History of Stroke F 1st degree relative <60       Family History Uterine cancer       Fam hx MI       Fam hx CHF  Social History:    Reviewed history from 11/09/2006 and no changes required:       Never Smoked       Alcohol use-yes       Drug use-no    Review of Systems  The patient denies anorexia, fever, weight loss, weight gain, vision loss, decreased hearing, hoarseness, chest pain, syncope, dyspnea on exertion, peripheral edema, prolonged cough, headaches, hemoptysis, abdominal pain, melena, hematochezia, severe indigestion/heartburn, hematuria, incontinence, genital sores, muscle weakness, suspicious skin lesions, transient blindness, difficulty walking, depression, unusual weight change, abnormal bleeding, enlarged lymph  nodes, angioedema, and breast masses.         insomnia and anxiety   Physical Exam  General:     Well-developed,well-nourished,in no acute distress; alert,appropriate and cooperative throughout examination Head:     Normocephalic and atraumatic without obvious abnormalities. No apparent alopecia or balding. Ears:     External ear exam shows no significant lesions or deformities.  Otoscopic examination reveals clear canals, tympanic membranes are intact bilaterally without bulging, retraction, inflammation or discharge. Hearing is grossly normal bilaterally. Mouth:     Oral mucosa and oropharynx without lesions or exudates.  Teeth in good repair. Neck:     No deformities, masses, or tenderness noted. Lungs:     Normal respiratory effort, chest expands symmetrically. Lungs are clear to auscultation, no crackles or wheezes. Heart:     Normal rate and regular rhythm. S1 and S2 normal without gallop, murmur, click, rub or other extra sounds. Abdomen:     Bowel sounds positive,abdomen soft and non-tender without masses, organomegaly or hernias noted. Psych:     subdued, moderately anxious, and easily distracted.      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   Problem # 3:  HYPERLIPIDEMIA (ICD-272.4)  Her updated medication list for this problem includes:    Crestor 5 Mg Tabs (Rosuvastatin calcium) ..... One by mouth daily  Labs Reviewed: Chol: 189 (02/07/2008)   HDL: 89.0 (02/07/2008)   LDL: 89 (02/07/2008)   TG: 54 (02/07/2008) SGOT: 32 (02/07/2008)   SGPT: 28 (02/07/2008)   Complete Medication List: 1)  Co Q-10 120 Mg Caps (Coenzyme q10) .Marland Kitchen.. 1 by mouth once daily 2)  Flax Seed Oil 1000 Mg Caps  (Flaxseed (linseed)) .Marland Kitchen.. 1 by mouth once daily 3)  Calcium 500/d 500-200 Mg-unit Tabs (Calcium carbonate-vitamin d) .Marland Kitchen.. 1 by mouth once daily 4)  Klonopin 0.5 Mg Tabs (Clonazepam) .... 1/2 at bedtime as needed 5)  Sonata 10 Mg Caps (Zaleplon) .... One by mouth q hs as needed insomnia. 6)  Crestor 5 Mg Tabs (Rosuvastatin calcium) .... One by mouth daily 7)  Bioidentical Hormones  .... Perform swab testing and compound bioidential hormone creams 8)  Unisom 25 Mg Tabs (Doxylamine succinate (sleep)) .Marland Kitchen.. 1 at bedtime  as needed sleep 9)  Buspirone Hcl 15 Mg Tabs (Buspirone hcl) .... 1/3 by mouth  two times a day for 1 wk then 1/3 three time a day for 1 wk the 1/2 two times a day for 1 wk  then one by mouth two times a day. 10)  Rozerem 8 Mg Tabs (Ramelteon) .... One by mouth daily   Patient Instructions: 1)  Please schedule a follow-up appointment in 6 weeks.   Prescriptions: SONATA 10 MG  CAPS (ZALEPLON) one by mouth q hs as needed insomnia.  #20 x 2   Entered and Authorized by:   Stacie Glaze MD   Signed by:   Stacie Glaze MD on 05/21/2008   Method used:   Print then Give to Patient   RxID:   [id number] BUSPIRONE HCL 15 MG TABS (BUSPIRONE HCL) 1/3 by mouth  two times a day for 1 wk then 1/3 three time a day for 1 wk the 1/2 two times a day for 1 wk  then one by mouth two times a day.  #60 x 6   Entered and Authorized by:   Stacie Glaze MD   Signed by:   Stacie Glaze MD on 05/21/2008   Method used:   Print then Give to Patient   RxID:   [id number]

## 2010-05-18 NOTE — Progress Notes (Signed)
Summary: refill  Phone Note Call from Patient Call back at Home Phone [phone]   Caller: pt live Call For: Lovell Sheehan Reason for Call: Acute Illness Summary of Call: zaleplon 10 mg generic for sonota She had it filled on November 6th and she has used 15 of 20 of these.  can [facility] refill [facility] [address]  Initial call taken by: Roselle Locus,  April 22, 2008 9:26 AM  Follow-up for Phone Call        informed pt that it has already been called in Follow-up by: Willy Eddy, LPN,  April 22, 2008 9:42 AM

## 2010-05-18 NOTE — Progress Notes (Signed)
  Phone Note Call from Patient Call back at Home Phone [phone]   Caller: Patient-VOICE MAIL Reason for Call: Acute Illness Complaint: Urinary/GYN Problems Summary of Call: REQUESTING CHLORESTEROL AND VITAMIN D LABS. SHE HAS INSOMNIA AND SEROQUEL ISN'T WORKING. KLONOPIN MAKES HER GROGGY. WANTS NEW RX 5 PILLS OF 5MG  OF VALIUM. SHE IS ALSO DOING ACCUPUNCTURE WHICH IS HELPING. SHE DOES NOT WANT VALIUM FOR EVERY NIGHT. SHE WANTS TO USE THEM MAYBE  4 OR 5 EVERY 10 NIGHTS. PLEASE ADVISE. Initial call taken by: Warnell Forester,  Aug 18, 2008 10:02 AM  Follow-up for Phone Call        ok for fasting lipids, liver and vitamin d-  she has ov on 6-1 mayve she can do that 1 week prior-please tell her I will call valium [facility] [address]- thanks-- code 272.4 Follow-up by: Willy Eddy, LPN,  Aug 19, 979 10:51 AM  Additional Follow-up for Phone Call Additional follow up Details #1::        PT HAS LAB APPT SCHEDULED AND IS AWARE OF RX. Additional Follow-up by: Warnell Forester,  Aug 18, 2008 11:06 AM    New/Updated Medications: VALIUM 5 MG TABS (DIAZEPAM) 1 at bedtime as needed sleep   Prescriptions: VALIUM 5 MG TABS (DIAZEPAM) 1 at bedtime as needed sleep  #15 x 1   Entered by:   Willy Eddy, LPN   Authorized by:   Stacie Glaze MD   Signed by:   Willy Eddy, LPN on 19/14/7829   Method used:   Telephoned to ...       [facility] [address] [phone]* (retail)       [address]       [place], [place]  [address]       Ph: [phone] or [phone]       Fax: [phone]   RxID:   [id number]

## 2010-05-18 NOTE — Assessment & Plan Note (Signed)
Summary: 2 MONTH ROV/NJR rsc appt time/njr   Vital Signs:  Patient Profile:   65 Years Old Female Height:     65.5 inches Weight:      122 pounds Temp:     98.5 degrees F oral Pulse rate:   76 / minute Resp:     14 per minute BP sitting:   110 / 70  (left arm)  Vitals Entered By: Willy Eddy, LPN (February 14, 2008 10:23 AM)                 Chief Complaint:  roa labs.    Current Allergies: ! PENICILLIN        Impression & Recommendations:  Problem # 1:  SYMPTOMATIC MENOPAUSAL/FEMALE CLIMACTERIC STATES (ICD-627.2) Assessment: Unchanged  The following medications were removed from the medication list:    Elestrin 0.52 Mg/0.87 Gm (0.06%) Gel (Estradiol) ..... Use as directed daily Discussed treatment options.   Problem # 2:  HYPERLIPIDEMIA (ICD-272.4) Assessment: Unchanged  Her updated medication list for this problem includes:    Crestor 5 Mg Tabs (Rosuvastatin calcium) ..... One by mouth daily  Labs Reviewed: Chol: 199 (03/19/2007)   HDL: 81.8 (03/19/2007)   LDL: 103 (03/19/2007)   TG: 71 (03/19/2007) SGOT: 29 (04/06/2007)   SGPT: 37 (04/06/2007)   Complete Medication List: 1)  Co Q-10 120 Mg Caps (Coenzyme q10) .Marland Kitchen.. 1 by mouth once daily 2)  Flax Seed Oil 1000 Mg Caps (Flaxseed (linseed)) .Marland Kitchen.. 1 by mouth once daily 3)  Calcium 500/d 500-200 Mg-unit Tabs (Calcium carbonate-vitamin d) .Marland Kitchen.. 1 by mouth once daily 4)  Klonopin 0.5 Mg Tabs (Clonazepam) .... 1/2 at bedtime as needed 5)  Sonata 10 Mg Caps (Zaleplon) .... One by mouth q hs as needed insomnia. 6)  Crestor 5 Mg Tabs (Rosuvastatin calcium) .... One by mouth daily 7)  Bioidentical Hormones  .... Perform swab testing and compound bioidential hormone creams   Patient Instructions: 1)  Please schedule a follow-up appointment in 3 months.   Prescriptions: BIOIDENTICAL HORMONES perform swab testing and compound bioidential hormone creams  #1 x 11   Entered and Authorized by:   Stacie Glaze  MD   Signed by:   Stacie Glaze MD on 02/14/2008   Method used:   Print then Give to Patient   RxID:   [id number]  ]

## 2010-05-18 NOTE — Assessment & Plan Note (Signed)
Summary: follow up per Bonnye/mhf   Vital Signs:  Patient Profile:   65 Years Old Female Height:     65.5 inches Weight:      122 pounds Temp:     98.5 degrees F rectal Pulse rate:   72 / minute Resp:     12 per minute BP sitting:   110 / 70  (left arm)  Vitals Entered By: Willy Eddy, LPN (December 11, 2007 11:16 AM)                 Chief Complaint:  roa--discuss faxed letter--c/o insomnia- rotating prn meds//ok//.  History of Present Illness: discussion of menopause and risks of replacement sleep disorder hot flashes and mood disorder  Follow-Up Visit      This is a 65 year old woman who presents for Follow-up visit.  The patient denies chest pain, palpitations, dizziness, syncope, low blood sugar symptoms, high blood sugar symptoms, edema, SOB, DOE, PND, and orthopnea.  Since the last visit the patient notes problems with medications.  The patient reports not taking meds as prescribed.  When questioned about possible medication side effects, the patient notes sexual dysfunction, fatigue, and depressive symptoms.      Current Allergies: ! PENICILLIN  Past Medical History:    Reviewed history from 04/06/2007 and no changes required:       Unremarkable  Past Surgical History:    Reviewed history from 04/06/2007 and no changes required:       Toe   Family History:    Reviewed history from 11/09/2006 and no changes required:       Family History Hypertension       Family History Other cancer-Breast       Family History Ovarian cancer       Family History of Stroke F 1st degree relative <60       Family History Uterine cancer       Fam hx MI       Fam hx CHF  Social History:    Reviewed history from 11/09/2006 and no changes required:       Never Smoked       Alcohol use-yes       Drug use-no    Review of Systems  The patient denies anorexia, fever, weight loss, weight gain, vision loss, decreased hearing, hoarseness, chest pain, syncope, dyspnea on  exertion, peripheral edema, prolonged cough, headaches, hemoptysis, abdominal pain, melena, hematochezia, severe indigestion/heartburn, hematuria, incontinence, genital sores, muscle weakness, suspicious skin lesions, transient blindness, difficulty walking, depression, unusual weight change, abnormal bleeding, enlarged lymph nodes, angioedema, and breast masses.     Physical Exam  General:     Well-developed,well-nourished,in no acute distress; alert,appropriate and cooperative throughout examination Eyes:     No corneal or conjunctival inflammation noted. EOMI. Perrla. Funduscopic exam benign, without hemorrhages, exudates or papilledema. Vision grossly normal. Nose:     External nasal examination shows no deformity or inflammation. Nasal mucosa are pink and moist without lesions or exudates. Mouth:     Oral mucosa and oropharynx without lesions or exudates.  Teeth in good repair. Neck:     No deformities, masses, or tenderness noted. Lungs:     Normal respiratory effort, chest expands symmetrically. Lungs are clear to auscultation, no crackles or wheezes. Heart:     Normal rate and regular rhythm. S1 and S2 normal without gallop, murmur, click, rub or other extra sounds.    Impression & Recommendations:  Problem # 1:  SYMPTOMATIC MENOPAUSAL/FEMALE CLIMACTERIC STATES (ICD-627.2)  Her updated medication list for this problem includes:    Elestrin 0.52 Mg/0.87 Gm (0.06%) Gel (Estradiol) ..... Use as directed daily Discussed treatment options.   Problem # 2:  LIVER FUNCTION TESTS, ABNORMAL (ICD-794.8) Advised patient to avoid alcohol and Tylenol. Call for worsening symptoms.   Problem # 3:  HYPERLIPIDEMIA (ICD-272.4)  Labs Reviewed: Chol: 199 (03/19/2007)   HDL: 81.8 (03/19/2007)   LDL: 103 (03/19/2007)   TG: 71 (03/19/2007) SGOT: 29 (04/06/2007)   SGPT: 37 (04/06/2007)  Her updated medication list for this problem includes:    Crestor 5 Mg Tabs (Rosuvastatin calcium) ..... One  by mouth daily   Complete Medication List: 1)  Co Q-10 120 Mg Caps (Coenzyme q10) .Marland Kitchen.. 1 by mouth once daily 2)  Flax Seed Oil 1000 Mg Caps (Flaxseed (linseed)) .Marland Kitchen.. 1 by mouth once daily 3)  Calcium 500/d 500-200 Mg-unit Tabs (Calcium carbonate-vitamin d) .Marland Kitchen.. 1 by mouth once daily 4)  Klonopin 0.5 Mg Tabs (Clonazepam) .... 1/2 at bedtime as needed 5)  Rozerem 8 Mg Tabs (Ramelteon) .... One by mouth q hs 6)  Sonata 10 Mg Caps (Zaleplon) .... One by mouth q hs as needed insomnia. 7)  Elestrin 0.52 Mg/0.87 Gm (0.06%) Gel (Estradiol) .... Use as directed daily 8)  Crestor 5 Mg Tabs (Rosuvastatin calcium) .... One by mouth daily   Patient Instructions: 1)  Please schedule a follow-up appointment in 2 months. 2)  Hepatic Panel prior to visit, ICD-9:995.20 3)  Lipid Panel prior to visit, ICD-9:  272.4 4)  CRP  272.4 5)  estradiol level 627.9   Prescriptions: ELESTRIN 0.52 MG/0.87 GM (0.06%) GEL (ESTRADIOL) use as directed daily  #1 unit x 11   Entered and Authorized by:   Stacie Glaze MD   Signed by:   Stacie Glaze MD on 12/11/2007   Method used:   [facility]        [facility] [address] [phone]* (retail)       [address]       [place], [place]  [address]       Ph: [phone] or [phone]       Fax: [phone]   RxID:   [id number]  ]

## 2010-05-18 NOTE — Progress Notes (Signed)
Summary: RX REFILL  Phone Note Call from Patient Call back at [phone]   Caller:  PT LIVE Call For: Stacie Glaze MD Summary of Call: PATIENT NEEDS RX REFILL FOR CLONAZAPAM .  [facility]. Initial call taken by: Celine Ahr,  March 06, 2008 4:51 PM      Prescriptions: KLONOPIN 0.5 MG  TABS (CLONAZEPAM) 1/2 at bedtime as needed  #30 x 6   Entered by:   Willy Eddy, LPN   Authorized by:   Stacie Glaze MD   Signed by:   Willy Eddy, LPN on 62/37/6283   Method used:   Telephoned to ...       [facility] [address] [phone]* (retail)       [address]       [place], [place]  [address]       Ph: [phone] or [phone]       Fax: [phone]   RxID:   [id number]

## 2010-05-18 NOTE — Progress Notes (Signed)
Summary: labs  Phone Note Call from Patient   Caller: Patient Call For: Stacie Glaze MD Reason for Call: Acute Illness Summary of Call: Pt is asking for labs to sent to her home address or faxed to [phone] with a cover letter. Initial call taken by: Lynann Beaver CMA,  September 29, 2009 1:36 PM  Follow-up for Phone Call        pt to fax ok to mail lab results.  Follow-up by: Willy Eddy, LPN,  September 29, 2009 2:01 PM

## 2010-05-18 NOTE — Assessment & Plan Note (Signed)
Summary: knee injury,me   History of Present Illness: Pleasant 65 yo athletic women used to play a lot of tennis was seeing dr Dione Housekeeper at [facility] severe DJD of RT GH joint now has regained most of motion after he did debridement still with spurring pain most days or with too much activity  Left knee developed pain walking down steps was carrying mod load knee did not lock/ give way or swell since though has been tight and hard to straighten pain under ant area  remote injury 30 yrs ago to left knee but no serious injuries to her knowledge  Allergies: 1)  ! Penicillin  Physical Exam  General:  Well-developed,well-nourished,in no acute distress; alert,appropriate and cooperative throughout examination Msk:  RT shoulder limit on back scratch is fairly large - to hip pocket mild weakness on IR has regained most of flexion regained 90% of full elevation strength is good with exeption of above  RT knee knee exam shows no effusion; stable ligaments; negative Mcmurray's and provocative meniscal tests; non painful patellar compression; patellar and quadriceps tendons unremarkable.  LT knee knee exam shows no effusion; ACL is 2+ lax but with endopint negative Mcmurray's and provocative meniscal tests; non painful patellar compression; patellar and quadriceps tendons unremarkable  lacks 5 deg of full extension but can force this    Impression & Recommendations:  Problem # 1:  KNEE PAIN (ICD-719.46)  Her updated medication list for this problem includes:    Tramadol Hcl 50 Mg Tabs (Tramadol hcl) ..... One tab two times a day.  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 One tab two times a day. This is pretty advanced  will try to lessen pain w tramadol has good funciton would not push ahead with replacement unless worsening  Complete Medication List: 1)  Calcium-vitamin D 250-125 Mg-unit Tabs (Calcium carbonate-vitamin d) .Marland Kitchen.. 1 once daily 2)  Sonata 10 Mg Caps (Zaleplon) .... One by mouth q hs as needed insomnia. 3)  Magnesium  .Marland Kitchen.. 800/day 4)  Omega-3 350 Mg Caps (Omega-3 fatty acids) .... 3 per day 5)  Grenlip Muscle Supplement For Osteo Arthritis  .Marland Kitchen.. 1 once daily 6)  Niacinamide 100 Mg Tabs (Niacinamide) .Marland Kitchen.. 1 once daily 7)  Zolpidem Tartrate 12.5 Mg Cr-tabs (Zolpidem tartrate) .... One by mouth q hs to use when sonata fails 8)  Bromellian  .... For shoulder 9)  Tramadol Hcl 50 Mg Tabs (Tramadol hcl) .... One tab two times a day. Prescriptions: TRAMADOL HCL 50 MG TABS (TRAMADOL HCL) ONE TAB two times a day.  #60 x 2   Entered by:   Lillia Pauls CMA   Authorized by:   Enid Baas MD   Signed by:   Lillia Pauls CMA on 12/29/2009   Method used:   [facility]        [facility] [address] [phone]* (retail)       [address]       [place], [place]  [address]       Ph: [phone] or [phone]       Fax: [phone]   RxID:  [id number]  

## 2010-05-18 NOTE — Progress Notes (Signed)
Summary: sleep study center/left message  Phone Note Outgoing Call   Call placed by: Darra Lis RMA,  January 19, 2009 3:01 PM Summary of Call: Left message at the sleep study center to confirm that the patient went to appointment and to get records. Initial call taken by: Darra Lis RMA,  January 19, 2009 3:02 PM  Follow-up for Phone Call        Sleep study has arrived. Follow-up by: Lynann Beaver CMA,  January 19, 2009 4:44 PM

## 2010-05-18 NOTE — Assessment & Plan Note (Signed)
Summary: cpx Jennifer [name]   Vital Signs:  Patient Profile:   65 Years Old Female Height:     65.5 inches Weight:      126 pounds Temp:     98.3 degrees F oral Pulse rate:   64 / minute Pulse rhythm:   regular BP sitting:   96 / 66  (left arm) Cuff size:   regular  Vitals Entered By: Alfred Levins, CMA (April 06, 2007 3:06 PM)                 Chief Complaint:  cpx and no pap.  History of Present Illness: CPX   Current Allergies (reviewed today): ! PENICILLIN Updated/Current Medications (including changes made in today's visit):  CO Q-10 120 MG  CAPS (COENZYME Q10) 1 by mouth once daily FLAX SEED OIL 1000 MG  CAPS (FLAXSEED (LINSEED)) 1 by mouth once daily CALCIUM 500/D 500-200 MG-UNIT  TABS (CALCIUM CARBONATE-VITAMIN D) 1 by mouth once daily   Past Medical History:    Reviewed history and no changes required:       Unremarkable  Past Surgical History:    Reviewed history and no changes required:       Toe   Family History:    Reviewed history from 11/09/2006 and no changes required:       Family History Hypertension       Family History Other cancer-Breast       Family History Ovarian cancer       Family History of Stroke F 1st degree relative <60       Family History Uterine cancer       Fam hx MI       Fam hx CHF  Social History:    Reviewed history from 11/09/2006 and no changes required:       Never Smoked       Alcohol use-yes       Drug use-no    Review of Systems  The patient denies anorexia, fever, weight loss, vision loss, decreased hearing, hoarseness, chest pain, syncope, dyspnea on exhertion, peripheral edema, prolonged cough, hemoptysis, abdominal pain, melena, hematochezia, severe indigestion/heartburn, hematuria, incontinence, genital sores, muscle weakness, suspicious skin lesions, transient blindness, difficulty walking, depression, unusual weight change, abnormal bleeding, enlarged lymph nodes, angioedema, and breast masses.     Physical  Exam  General:     Well-developed,well-nourished,in no acute distress; alert,appropriate and cooperative throughout examination Head:     Normocephalic and atraumatic without obvious abnormalities. No apparent alopecia or balding. Ears:     External ear exam shows no significant lesions or deformities.  Otoscopic examination reveals clear canals, tympanic membranes are intact bilaterally without bulging, retraction, inflammation or discharge. Hearing is grossly normal bilaterally. Nose:     External nasal examination shows no deformity or inflammation. Nasal mucosa are pink and moist without lesions or exudates. Mouth:     Oral mucosa and oropharynx without lesions or exudates.  Teeth in good repair. Neck:     No deformities, masses, or tenderness noted. Chest Wall:     No deformities, masses, or tenderness noted. Breasts:     No mass, nodules, thickening, tenderness, bulging, retraction, inflamation, nipple discharge or skin changes noted.   Lungs:     Normal respiratory effort, chest expands symmetrically. Lungs are clear to auscultation, no crackles or wheezes. Heart:     Normal rate and regular rhythm. S1 and S2 normal without gallop, murmur, click, rub or other extra sounds. Abdomen:  Bowel sounds positive,abdomen soft and non-tender without masses, organomegaly or hernias noted. Msk:     No deformity or scoliosis noted of thoracic or lumbar spine.   Pulses:     R and L carotid,radial,femoral,dorsalis pedis and posterior tibial pulses are full and equal bilaterally Extremities:     No clubbing, cyanosis, edema, or deformity noted with normal full range of motion of all joints.   Neurologic:     No cranial nerve deficits noted. Station and gait are normal. Plantar reflexes are down-going bilaterally. DTRs are symmetrical throughout. Sensory, motor and coordinative functions appear intact. Skin:     Intact without suspicious lesions or rashes Cervical Nodes:     No  lymphadenopathy noted Axillary Nodes:     No palpable lymphadenopathy Inguinal Nodes:     No significant adenopathy Psych:     Cognition and judgment appear intact. Alert and cooperative with normal attention span and concentration. No apparent delusions, illusions, hallucinations    Impression & Recommendations:  Problem # 1:  PHYSICAL EXAMINATION, NORMAL (ICD-V70.0) normal except for elevated LFT's withoput known cause  Problem # 2:  LIVER FUNCTION TESTS, ABNORMAL (ICD-794.8) may be the red rice yeast Orders: TLB-Hepatic/Liver Function Pnl (80076-HEPATIC) TLB-Iron, (Fe) Total (83540-FE) TLB-Transferrin (84466-TRNSF) T-Ceruloplasmin ([address]-60454) T-Hepatitis Acute Panel (09811-91478) Advised patient to avoid alcohol and Tylenol. Call for worsening symptoms.   Complete Medication List: 1)  Co Q-10 120 Mg Caps (Coenzyme q10) .Marland Kitchen.. 1 by mouth once daily 2)  Flax Seed Oil 1000 Mg Caps (Flaxseed (linseed)) .Marland Kitchen.. 1 by mouth once daily 3)  Calcium 500/d 500-200 Mg-unit Tabs (Calcium carbonate-vitamin d) .Marland Kitchen.. 1 by mouth once daily   Patient Instructions: 1)  Please schedule a follow-up appointment in 1 month.    ]  Appended Document: Orders Update    Clinical Lists Changes  Orders: Added new Service order of Venipuncture [phone]) - Signed

## 2010-05-18 NOTE — Progress Notes (Signed)
Summary: should she stop taking red yeast rice  Phone Note Call from Patient Call back at Home Phone [phone]   Caller: patient triage message Call For: jenkins Summary of Call: Dr Lovell Sheehan was to let her know if she should stop taking red yeast rice.   Initial call taken by: Roselle Locus,  April 10, 2007 3:49 PM  Follow-up for Phone Call        ok to continue Follow-up by: Birdie Sons MD,  April 10, 2007 9:32 PM  Additional Follow-up for Phone Call Additional follow up Details #1::        Phone Call Completed Additional Follow-up by: Arcola Jansky, RN,  April 11, 2007 9:29 AM

## 2010-05-18 NOTE — Assessment & Plan Note (Signed)
Summary: cpx/bmw   Vital Signs:  Patient profile:   65 year old female Height:      65.5 inches Weight:      124 pounds BMI:     20.39 Temp:     98.2 degrees F oral Pulse rate:   72 / minute Resp:     14 per minute BP sitting:   114 / 70  (left arm)  Vitals Entered By: Willy Eddy, LPN (March 05, 2009 12:31 PM)  CC:  cpx.  History of Present Illness: The pt was asked about all immunizations, health maint. services that are appropriate to their age and was given guidance on diet exercize  and weight management   Current Medications (verified): 1)  Calcium-Vitamin D 250-125 Mg-Unit Tabs (Calcium Carbonate-Vitamin D) .Marland Kitchen.. 1 Once Daily 2)  Sonata 10 Mg  Caps (Zaleplon) .... One By Mouth Q Hs As Needed Insomnia. 3)  Unisom 25 Mg Tabs (Doxylamine Succinate (Sleep)) .Marland Kitchen.. 1 At Bedtime  As Needed Sleep 4)  Multivitamins  Caps (Multiple Vitamin) .Marland Kitchen.. 1 Once Daily 5)  Magnesium .Marland Kitchen.. 800/day 6)  Omega-3 350 Mg Caps (Omega-3 Fatty Acids) .... 3 Per Day 7)  Grenlip Muscle Supplement For Osteo Arthritis .Marland Kitchen.. 1 Once Daily 8)  Valium 5 Mg Tabs (Diazepam) .Marland Kitchen.. 1 At Bedtime As Needed Sleep 9)  Niacinamide 100 Mg Tabs (Niacinamide) .Marland Kitchen.. 1 Once Daily 10)  Ambien 10 Mg Tabs (Zolpidem Tartrate) .Marland Kitchen.. 1 At Bedtime As Needed Sleep  Allergies (verified): 1)  ! Penicillin  Past History:  Family History: Last updated: 11/09/2006 Family History Hypertension Family History Other cancer-Breast Family History Ovarian cancer Family History of Stroke F 1st degree relative <60 Family History Uterine cancer Fam hx MI Fam hx CHF  Social History: Last updated: 11/09/2006 Never Smoked Alcohol use-yes Drug use-no  Risk Factors: Smoking Status: never (11/09/2006)  Past medical, surgical, family and social histories (including risk factors) reviewed, and no changes noted (except as noted below).  Past Medical History: Reviewed history from 04/06/2007 and no changes  required. Unremarkable  Past Surgical History: Reviewed history from 04/06/2007 and no changes required. Toe  Family History: Reviewed history from 11/09/2006 and no changes required. Family History Hypertension Family History Other cancer-Breast Family History Ovarian cancer Family History of Stroke F 1st degree relative <60 Family History Uterine cancer Fam hx MI Fam hx CHF  Social History: Reviewed history from 11/09/2006 and no changes required. Never Smoked Alcohol use-yes Drug use-no  Review of Systems  The patient denies anorexia, fever, weight loss, weight gain, vision loss, decreased hearing, hoarseness, chest pain, syncope, dyspnea on exertion, peripheral edema, prolonged cough, headaches, hemoptysis, abdominal pain, melena, hematochezia, severe indigestion/heartburn, hematuria, incontinence, genital sores, muscle weakness, suspicious skin lesions, transient blindness, difficulty walking, depression, unusual weight change, abnormal bleeding, enlarged lymph nodes, angioedema, and breast masses.    Physical Exam  General:  Well-developed,well-nourished,in no acute distress; alert,appropriate and cooperative throughout examination Head:  Normocephalic and atraumatic without obvious abnormalities. No apparent alopecia or balding. Eyes:  vision grossly intact, pupils equal, and pupils round.   Ears:  R ear normal and L ear normal.   Nose:  no external deformity and no nasal discharge.   Mouth:  pharynx pink and moist and no erythema.   Neck:  No deformities, masses, or tenderness noted. Chest Wall:  No deformities, masses, or tenderness noted. Breasts:  No mass, nodules, thickening, tenderness, bulging, retraction, inflamation, nipple discharge or skin changes noted.   Lungs:  Normal  respiratory effort, chest expands symmetrically. Lungs are clear to auscultation, no crackles or wheezes. Heart:  Normal rate and regular rhythm. S1 and S2 normal without gallop, murmur,  click, rub or other extra sounds. Abdomen:  Bowel sounds positive,abdomen soft and non-tender without masses, organomegaly or hernias noted. Msk:  No deformity or scoliosis noted of thoracic or lumbar spine.   Pulses:  R and L carotid,radial,femoral,dorsalis pedis and posterior tibial pulses are full and equal bilaterally Extremities:  No clubbing, cyanosis, edema, or deformity noted with normal full range of motion of all joints.   Neurologic:  alert & oriented X3 and DTRs symmetrical and normal.     Impression & Recommendations:  Problem # 1:  PREVENTIVE HEALTH CARE (ICD-V70.0) The pt was asked about all immunizations, health maint. services that are appropriate to their age and was given guidance on diet exercize  and weight management  Mammogram: normal (03/04/2009) Colonoscopy: Location:  [facility].   (12/20/2007) Td Booster: Tdap (03/05/2009)   Chol: 216 (09/02/2008)   HDL: 78.00 (09/02/2008)   LDL: 89 (02/07/2008)   TG: 66.0 (09/02/2008) TSH: 2.57 (03/19/2007)   Next mammogram due:: 03/2010 (03/05/2009) Next Colonoscopy due:: 12/2017 (12/20/2007)  Discussed using sunscreen, use of alcohol, drug use, self breast exam, routine dental care, routine eye care, schedule for GYN exam, routine physical exam, seat belts, multiple vitamins, osteoporosis prevention, adequate calcium intake in diet, recommendations for immunizations, mammograms and Pap smears.  Discussed exercise and checking cholesterol.  Discussed gun safety, safe sex, and contraception.  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 1 once daily 2)  Sonata 10 Mg Caps (Zaleplon) .... One by mouth q hs as needed insomnia. 3)  Unisom 25 Mg Tabs (Doxylamine succinate (sleep)) .Marland Kitchen.. 1 at bedtime  as needed sleep 4)  Multivitamins Caps (Multiple vitamin)  .Marland Kitchen.. 1 once daily 5)  Magnesium  .Marland Kitchen.. 800/day 6)  Omega-3 350 Mg Caps (Omega-3 fatty acids) .... 3 per day 7)  Grenlip Muscle Supplement For Osteo Arthritis  .Marland Kitchen.. 1 once daily 8)  Valium 5 Mg Tabs (Diazepam) .Marland Kitchen.. 1 at bedtime as needed sleep 9)  Niacinamide 100 Mg Tabs (Niacinamide) .Marland Kitchen.. 1 once daily 10)  Ambien 10 Mg Tabs (Zolpidem tartrate) .Marland Kitchen.. 1 at bedtime as needed sleep  Other Orders: Tdap => 71yrs IM ([address]109) Admin 1st Vaccine (60454)  Patient Instructions: 1)  Please schedule a follow-up appointment in 6 months.   Preventive Care Screening  Mammogram:    Date:  03/04/2009    Next Due:  03/2010    Results:  normal    Contraindications/Deferment of Procedures/Staging:    Test/Procedure: PAP Smear    Reason for deferment: hysterectomy     Immunizations Administered:  Tetanus Vaccine:    Vaccine Type: Tdap    Site: left deltoid    Mfr: GlaxoSmithKline    Dose: 0.5 ml    Route: IM    Given by: Willy Eddy, LPN    Exp. Date: 11/01/2010    Lot #: [id number]

## 2010-05-18 NOTE — Assessment & Plan Note (Signed)
Summary: 1 month roa/jls   Vital Signs:  Patient Profile:   65 Years Old Female Height:     65.5 inches Weight:      125 pounds Temp:     98.2 degrees F oral Pulse rate:   72 / minute Resp:     12 per minute BP sitting:   106 Vitals Entered By: Willy Eddy, LPN (May 15, 2007 1:57 PM)                 Chief Complaint:  f/u melatonin/works well for sleep but ocassionaly has to use klonopin .5 1/2 prn.  History of Present Illness: insomnia   Current Allergies: ! PENICILLIN  Past Medical History:    Reviewed history from 04/06/2007 and no changes required:       Unremarkable  Past Surgical History:    Reviewed history from 04/06/2007 and no changes required:       Toe   Family History:    Reviewed history from 11/09/2006 and no changes required:       Family History Hypertension       Family History Other cancer-Breast       Family History Ovarian cancer       Family History of Stroke F 1st degree relative <60       Family History Uterine cancer       Fam hx MI       Fam hx CHF  Social History:    Reviewed history from 11/09/2006 and no changes required:       Never Smoked       Alcohol use-yes       Drug use-no    Review of Systems  The patient denies anorexia, fever, weight loss, vision loss, decreased hearing, hoarseness, chest pain, syncope, dyspnea on exhertion, peripheral edema, prolonged cough, hemoptysis, abdominal pain, melena, hematochezia, severe indigestion/heartburn, hematuria, incontinence, genital sores, muscle weakness, suspicious skin lesions, transient blindness, difficulty walking, depression, unusual weight change, abnormal bleeding, enlarged lymph nodes, angioedema, breast masses, and testicular masses.     Physical Exam  General:     Well-developed,well-nourished,in no acute distress; alert,appropriate and cooperative throughout examination Head:     Normocephalic and atraumatic without obvious abnormalities. No apparent  alopecia or balding. Ears:     External ear exam shows no significant lesions or deformities.  Otoscopic examination reveals clear canals, tympanic membranes are intact bilaterally without bulging, retraction, inflammation or discharge. Hearing is grossly normal bilaterally. Mouth:     Oral mucosa and oropharynx without lesions or exudates.  Teeth in good repair. Neck:     No deformities, masses, or tenderness noted. Lungs:     Normal respiratory effort, chest expands symmetrically. Lungs are clear to auscultation, no crackles or wheezes. Heart:     Normal rate and regular rhythm. S1 and S2 normal without gallop, murmur, click, rub or other extra sounds.    Impression & Recommendations:  Problem # 1:  LIVER FUNCTION TESTS, ABNORMAL (ICD-794.8) Advised patient to avoid alcohol and Tylenol. Call for worsening symptoms.   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 1 by mouth once daily 2)  Flax Seed Oil 1000 Mg Caps (Flaxseed (linseed)) .Marland Kitchen.. 1 by mouth once daily 3)  Calcium 500/d 500-200 Mg-unit Tabs (Calcium carbonate-vitamin d) .Marland Kitchen.. 1 by mouth once daily 4)  Klonopin 0.5 Mg Tabs (Clonazepam) .... 1/2  at bedtime as needed 5)  Rozerem 8 Mg Tabs (Ramelteon) .... One by mouth q hs     Prescriptions: KLONOPIN 0.5 MG  TABS (CLONAZEPAM) 1/2 at bedtime as needed  #30 x 6   Entered and Authorized by:   Stacie Glaze MD   Signed by:   Stacie Glaze MD on 05/15/2007   Method used:   Print then Give to Patient   RxID:   [id number] ROZEREM 8 MG  TABS (RAMELTEON) one by mouth q HS  #30 x 11   Entered and Authorized by:   Stacie Glaze MD   Signed by:   Stacie Glaze MD on 05/15/2007   Method used:   Print then Give to Patient   RxID:   [id number]  ]

## 2010-05-18 NOTE — Progress Notes (Signed)
Summary: opinion  Phone Note Call from Patient   Caller: Patient Call For: Stacie Glaze MD Reason for Call: Acute Illness Summary of Call: [phone] Leave message. Pt had a mammogram at [facility] 6 months ago, and they wanted to repeat it this week.  Wants to ask Dr. Lovell Sheehan if he feels it is really necessary.  Please let her know, and may leave message. Initial call taken by: Lynann Beaver CMA,  Aug 24, 2009 1:57 PM  Follow-up for Phone Call        per dr Lovell Sheehan- if they said 6 months follow up that is what she should have Follow-up by: Willy Eddy, LPN,  Aug 25, 6642 2:05 PM  Additional Follow-up for Phone Call Additional follow up Details #1::        Pt notified. Additional Follow-up by: Lynann Beaver CMA,  Aug 24, 2009 2:24 PM

## 2010-05-18 NOTE — Progress Notes (Signed)
Summary: rash/flush with Niacin  Phone Note Call from Patient   Caller: Patient Call For: jenkins Summary of Call: pt c/o rash from taking niacin the time release and also the flush 3, do i stop taking this or is there something else. please call. please leave message. Initial call taken by: Calvert Cantor,  November 29, 2006 9:42 AM  Follow-up for Phone Call        stop the niacin and replace it with "beyong red rice yeast"   this can be purchased on line or at [facility] on [address] Follow-up by: Stacie Glaze MD,  November 30, 2006 12:33 PM  Additional Follow-up for Phone Call Additional follow up Details #1::        Pt aware. Additional Follow-up by: Romualdo Bolk, CMA,  November 30, 2006 12:37 PM

## 2010-05-18 NOTE — Progress Notes (Signed)
Summary: stop the rozerem?  Phone Note Call from Patient   Caller: Patient Call For: Dr. Lovell Sheehan Summary of Call: Pt does not hink Buspirone is working for her, but if Dr. Lovell Sheehan wants her to continue, she needs refills at [facility]). She still is altenating the other sleep meds to go to sleep, and wonders if Ambien would help her more. May leave a message at [phone] Initial call taken by: Lynann Beaver CMA,  June 26, 2008 8:03 AM  Follow-up for Phone Call        stop wellbutrin and try seroquel 25 at bedtime- this is an atypical antidepressant that has really helped his pts to with sleep and anxiety. Follow-up by: Willy Eddy, LPN,  June 26, 2008 1:30 PM  Additional Follow-up for Phone Call Additional follow up Details #1::        Patient never recieved a call back yesterday and she is going to run out of her busperion-its really not helping her sleep.  Please call today-going out of town. Additional Follow-up by: Barnie Mort,  June 27, 2008 9:42 AM    Additional Follow-up for Phone Call Additional follow up Details #2::    Patient aware.  Should she stop the Rozerem?  [facility]?  Additional Follow-up for Phone Call Additional follow up Details #3:: Details for Additional Follow-up Action Taken: per dr Lovell Sheehan stop the rozerm  Patient aware. Rudy Jew, RN  June 27, 2008 12:46 PM  Additional Follow-up by: Willy Eddy, LPN,  June 27, 2008 10:27 AM  New/Updated Medications: SEROQUEL 25 MG TABS (QUETIAPINE FUMARATE) One at bedtime   Prescriptions: SEROQUEL 25 MG TABS (QUETIAPINE FUMARATE) One at bedtime  #30 x 3   Entered by:   Rudy Jew, RN   Authorized by:   Stacie Glaze MD   Signed by:   Rudy Jew, RN on 06/27/2008   Method used:   [facility]        [facility] [address] [phone][facility] (retail)       [address]       [place], [place]  [address]       Ph: [phone] or  [phone]       Fax: [phone]   RxID:   [id number]

## 2010-05-18 NOTE — Progress Notes (Signed)
Summary: fax orders needed  Phone Note Call from Patient Call back at [phone]   Caller: live Call For: jenkins Summary of Call: Please fax order to Dr. Brynda Rim office [phone] fax#, phone # [phone], for BSGI, imaging & mammogram test, necessary per [facility] office because of her stron family hx breat cancer, mother & her own cysts, and Bone Density, it has been 31yrs.   Initial call taken by: Rudy Jew, RN,  January 28, 2008 10:14 AM  Follow-up for Phone Call        faxed to [facility] Follow-up by: Willy Eddy, LPN,  January 28, 2008 1:34 PM  New Problems: NEOPLASM, MALIGNANT, BREAST, FAMILY HX (ICD-V16.3)   New Problems: NEOPLASM, MALIGNANT, BREAST, FAMILY HX (ICD-V16.3)

## 2010-05-18 NOTE — Assessment & Plan Note (Signed)
Summary: 2 MO ROV/MM   Vital Signs:  Patient profile:   65 year old female Height:      65.5 inches Weight:      122 pounds BMI:     20.07 Temp:     98.2 degrees F oral Pulse rate:   72 / minute Resp:     14 per minute BP sitting:   110 / 70  (left arm)  Vitals Entered By: Willy Eddy, LPN (September 16, 1608 1:29 PM)  CC:  roa labs.  History of Present Illness: reveiw of the HDL and LDL  and the cholesterol risk factors red rice yeast is working witrh niacin and coq 10  Lipid Management History:      Positive NCEP/ATP III risk factors include female age 52 years old or older.  Negative NCEP/ATP III risk factors include HDL cholesterol greater than 60 and non-tobacco-user status.     Current Medications (verified): 1)  Flax Seed Oil 1000 Mg  Caps (Flaxseed (Linseed)) Meal .... 1 By Mouth Once Daily At Times 2)  Calcium 500/d 500-200 Mg-Unit  Tabs (Calcium Carbonate-Vitamin D) .... 3 Three Times A Day 3)  Sonata 10 Mg  Caps (Zaleplon) .... One By Mouth Q Hs As Needed Insomnia. 4)  Unisom 25 Mg Tabs (Doxylamine Succinate (Sleep)) .Marland Kitchen.. 1 At Bedtime  As Needed Sleep 5)  Multivitamins  Caps (Multiple Vitamin) .Marland Kitchen.. 1 Once Daily 6)  Vitamin D 1000 Unit Caps (Cholecalciferol) .Marland Kitchen.. 1 Three Times A Day 7)  Magnesium .Marland Kitchen.. 800/day 8)  Omega-3 350 Mg Caps (Omega-3 Fatty Acids) .... 3 Per Day 9)  Grenlip Muscle Supplement For Osteo Arthritis .Marland Kitchen.. 1 Once Daily 10)  Valium 5 Mg Tabs (Diazepam) .Marland Kitchen.. 1 At Bedtime As Needed Sleep 11)  Red Yeast Rice 600 Mg Caps (Red Yeast Rice Extract) .... 2 Once Daily 12)  Co Q 10 Plus Mi Acine 2 Qd .... 2 Once Daily  Allergies (verified): 1)  ! Penicillin  Past History:  Family History: Last updated: 11/09/2006 Family History Hypertension Family History Other cancer-Breast Family History Ovarian cancer Family History of Stroke F 1st degree relative <60 Family History Uterine cancer Fam hx MI Fam hx CHF  Social History: Last updated:  11/09/2006 Never Smoked Alcohol use-yes Drug use-no  Risk Factors: Smoking Status: never (11/09/2006)  Past medical, surgical, family and social histories (including risk factors) reviewed, and no changes noted (except as noted below).  Past Medical History: Reviewed history from 04/06/2007 and no changes required. Unremarkable  Past Surgical History: Reviewed history from 04/06/2007 and no changes required. Toe  Family History: Reviewed history from 11/09/2006 and no changes required. Family History Hypertension Family History Other cancer-Breast Family History Ovarian cancer Family History of Stroke F 1st degree relative <60 Family History Uterine cancer Fam hx MI Fam hx CHF  Social History: Reviewed history from 11/09/2006 and no changes required. Never Smoked Alcohol use-yes Drug use-no  Review of Systems  The patient denies anorexia, fever, weight loss, weight gain, vision loss, decreased hearing, hoarseness, chest pain, syncope, dyspnea on exertion, peripheral edema, prolonged cough, headaches, hemoptysis, abdominal pain, melena, hematochezia, severe indigestion/heartburn, hematuria, incontinence, genital sores, muscle weakness, suspicious skin lesions, transient blindness, difficulty walking, depression, unusual weight change, abnormal bleeding, enlarged lymph nodes, angioedema, and breast masses.    Physical Exam  General:  Well-developed,well-nourished,in no acute distress; alert,appropriate and cooperative throughout examination Eyes:  No corneal or conjunctival inflammation noted. EOMI. Perrla. Funduscopic exam benign, without hemorrhages, exudates or  papilledema. Vision grossly normal. Nose:  External nasal examination shows no deformity or inflammation. Nasal mucosa are pink and moist without lesions or exudates. Mouth:  Oral mucosa and oropharynx without lesions or exudates.  Teeth in good repair. Neck:  No deformities, masses, or tenderness noted. Lungs:   Normal respiratory effort, chest expands symmetrically. Lungs are clear to auscultation, no crackles or wheezes. Heart:  Normal rate and regular rhythm. S1 and S2 normal without gallop, murmur, click, rub or other extra sounds. Abdomen:  Bowel sounds positive,abdomen soft and non-tender without masses, organomegaly or hernias noted. Msk:  No deformity or scoliosis noted of thoracic or lumbar spine.   Extremities:  No clubbing, cyanosis, edema, or deformity noted with normal full range of motion of all joints.   Neurologic:  No cranial nerve deficits noted. Station and gait are normal. Plantar reflexes are down-going bilaterally. DTRs are symmetrical throughout. Sensory, motor and coordinative functions appear intact.   Impression & Recommendations:  Problem # 1:  INSOMNIA (ICD-780.52) takes a valium is she is not assleep and this is efective Her updated medication list for this problem includes:    Sonata 10 Mg Caps (Zaleplon) ..... One by mouth q hs as needed insomnia.    Unisom 25 Mg Tabs (Doxylamine succinate (sleep)) .Marland Kitchen... 1 at bedtime  as needed sleep  Discussed sleep hygiene.   Problem # 2:  HYPERLIPIDEMIA (ICD-272.4)  reveiw of the effect of the red rice yeast The following medications were removed from the medication list:    Crestor 5 Mg Tabs (Rosuvastatin calcium) ..... One by mouth daily  Labs Reviewed: SGOT: 29 (09/02/2008)   SGPT: 28 (09/02/2008)  Lipid Goals: Chol Goal: 200 (09/16/2008)   HDL Goal: 40 (09/16/2008)   LDL Goal: 160 (09/16/2008)   TG Goal: 150 (09/16/2008)  10 Yr Risk Heart Disease: 2 %   HDL:78.00 (09/02/2008), 89.0 (02/07/2008)  LDL:89 (02/07/2008), 103 (16/01/9603)  Chol:216 (09/02/2008), 189 (02/07/2008)  Trig:66.0 (09/02/2008), 54 (02/07/2008)  Problem # 3:  LIVER FUNCTION TESTS, ABNORMAL (ICD-794.8) discussion of the medication asnd side efects normali  Advised patient to avoid alcohol and Tylenol. Call for worsening symptoms.   Complete  Medication List: 1)  Flax Seed Oil 1000 Mg Caps (flaxseed (linseed)) Meal  .... 1 by mouth once daily at times 2)  Calcium 500/d 500-200 Mg-unit Tabs (Calcium carbonate-vitamin d) .... 3 three times a day 3)  Sonata 10 Mg Caps (Zaleplon) .... One by mouth q hs as needed insomnia. 4)  Unisom 25 Mg Tabs (Doxylamine succinate (sleep)) .Marland Kitchen.. 1 at bedtime  as needed sleep 5)  Multivitamins Caps (Multiple vitamin) .Marland Kitchen.. 1 once daily 6)  Vitamin D 1000 Unit Caps (Cholecalciferol) .Marland Kitchen.. 1 three times a day 7)  Magnesium  .Marland Kitchen.. 800/day 8)  Omega-3 350 Mg Caps (Omega-3 fatty acids) .... 3 per day 9)  Grenlip Muscle Supplement For Osteo Arthritis  .Marland Kitchen.. 1 once daily 10)  Valium 5 Mg Tabs (Diazepam) .Marland Kitchen.. 1 at bedtime as needed sleep 11)  Red Yeast Rice 600 Mg Caps (Red yeast rice extract) .... 2 once daily 12)  Co Q 10 Plus Mi Acine 2 Qd  .... 2 once daily  Lipid Assessment/Plan:      Based on NCEP/ATP III, the patient's risk factor category is "0-1 risk factors".  The patient's lipid goals are as follows: Total cholesterol goal is 200; LDL cholesterol goal is 160; HDL cholesterol goal is 40; Triglyceride goal is 150.  Her LDL cholesterol goal has been met.  Patient Instructions: 1)  Dec 2010  CPX welness exam

## 2010-05-18 NOTE — Progress Notes (Signed)
Summary: Sleep Study question  Phone Note Call from Patient Call back at Home Phone [phone]   Caller: Patient Call For: Stacie Glaze MD Reason for Call: Acute Illness, Lab or Test Results Summary of Call: Pt cannot get to sleep, and when she does, awakens often, just not getting enough sleep, eats well, active, frustrated having to take a pill every night for sleep.  Pt questioning if she would benefit from the Sleep Study Program?  As fas as she knows she does not snore or have apnea spells. May leave a detailed message on home phone.  Pt home all next week, gone week of 1/18 thru 1/22. Initial call taken by: Sid Falcon LPN,  April 25, 2008 9:52 AM  Follow-up for Phone Call        per dr Lovell Sheehan - may set up sleep study- may have restless leg Follow-up by: Willy Eddy, LPN,  April 25, 2008 10:28 AM  New Problems: INSOMNIA (ICD-780.52)   New Problems: INSOMNIA (ICD-780.52)

## 2010-05-18 NOTE — Miscellaneous (Signed)
Summary: BONE DENSITY  Clinical Lists Changes  Orders: Added new Test order of T-Bone Densitometry (77080) - Signed Added new Test order of T-Lumbar Vertebral Assessment (77082) - Signed 

## 2010-05-18 NOTE — Progress Notes (Signed)
Summary: colonoscopy 10 years ?  Phone Note Call from Patient Call back at Home Phone [phone]   Caller: pt vm triage Call For: Lovell Sheehan Summary of Call: got a note about a colonoscopy.  Had one 8 years ago.  Has read that she should wait 10 years since there is not family history of phlops.  Can she wait till 10 years  Initial call taken by: Roselle Locus,  November 12, 2007 9:22 AM  Follow-up for Phone Call        left message on machine we stand behind what gi suggest-- they are the specialist - if question call gi-nimber given Follow-up by: Willy Eddy, LPN,  November 12, 2007 9:24 AM

## 2010-05-18 NOTE — Procedures (Signed)
Summary: Colonoscopy   Colonoscopy  Procedure date:  12/20/2007  Findings:      Location:  [facility].    Procedures Next Due Date:    Colonoscopy: 12/2017  Patient Name: Jennifer [name], Cegielski MRN:  Procedure Procedures: Colonoscopy CPT: 19147.  Personnel: Endoscopist: Dora L. Juanda Chance, MD.  Referred By: Stacie Glaze, MD.  Exam Location: Exam performed in Outpatient Clinic. Outpatient  Patient Consent: Procedure, Alternatives, Risks and Benefits discussed, consent obtained, from patient. Consent was obtained by the RN.  Indications  Average Risk Screening Routine.  History  Current Medications: Patient is not currently taking Coumadin.  Pre-Exam Physical: Performed Dec 20, 2007. Entire physical exam was normal.  Comments: Pt. history reviewed/updated, physical exam performed prior to initiation of sedation?yes Exam Exam: Extent of exam reached: Cecum, extent intended: Cecum.  The cecum was identified by appendiceal orifice and IC valve. Duration of exam: time in 7:28 min, out 4:33 min minutes. Colon retroflexion performed. Images taken. ASA Classification: I. Tolerance: good.  Monitoring: Pulse and BP monitoring, Oximetry used. Supplemental O2 given.  Colon Prep Used Miralax for colon prep. Prep results: good.  Sedation Meds: Patient assessed and found to be appropriate for moderate (conscious) sedation. Fentanyl 75 mcg. given IV. Versed 6 mg. given IV.  Findings - NORMAL EXAM: Cecum.  - NORMAL EXAM: to Rectum.   Assessment Normal examination.  Comments: no polyps Events  Unplanned Interventions: No intervention was required.  Unplanned Events: There were no complications. Plans Patient Education: Patient given standard instructions for: Patient instructed to get routine colonoscopy every 10 years.  Comments: stool cards every 2-3 years Disposition: After procedure patient sent to recovery. After recovery patient sent home.     cc:   Stacie Glaze, MD   This report was created from the original endoscopy report, which was reviewed and signed by the above listed endoscopist.

## 2010-05-18 NOTE — Progress Notes (Signed)
Summary: BONE DENSITY SCHEDULED  Phone Note Call from Patient Call back at Home Phone [phone]   Reason for Call: Acute Illness Summary of Call: Pt is supposed to have a bone density and needs an order, but she is not going to go [place] or Fosamax, etc.  Who do you prefer she see? She has gone to [facility] before? Initial call taken by: Lynann Beaver CMA AAMA,  March 24, 2010 2:24 PM  Follow-up for Phone Call        please set up bone density at [facility] Follow-up by: Willy Eddy, LPN,  March 24, 2010 2:34 PM  Additional Follow-up for Phone Call Additional follow up Details #1::        Appt set up with for [facility] location for Bone Density.... Pt acknowledged same.  Additional Follow-up by: Debbra Riding,  March 24, 2010 2:37 PM

## 2010-05-18 NOTE — Assessment & Plan Note (Signed)
Summary: 6 wk rov/njr   Vital Signs:  Patient profile:   65 year old female Height:      65.5 inches Weight:      121 pounds BMI:     19.90 Temp:     98.5 degrees F oral Pulse rate:   76 / minute Resp:     14 per minute BP sitting:   116 / 70  (left arm)  Vitals Entered By: Willy Eddy, LPN (July 09, 2008 2:44 PM)  CC:  roa.  History of Present Illness: Did well on the seroquil until several days ago but had episodes of dizzyness still feels that the quality sleep  was achieve the seroquil  may not have cause the dizzyness discussion of trial of 1/2 dose  Current Medications (verified): 1)  Co Q-10 120 Mg  Caps (Coenzyme Q10) .Marland Kitchen.. 1 By Mouth Once Daily 2)  Flax Seed Oil 1000 Mg  Caps (Flaxseed (Linseed)) Meal .... 1 By Mouth Once Daily 3)  Calcium 500/d 500-200 Mg-Unit  Tabs (Calcium Carbonate-Vitamin D) .... 3 By Mouth Once Daily 4)  Klonopin 0.5 Mg  Tabs (Clonazepam) .... 1/2 At Bedtime As Needed 5)  Sonata 10 Mg  Caps (Zaleplon) .... One By Mouth Q Hs As Needed Insomnia. 6)  Crestor 5 Mg Tabs (Rosuvastatin Calcium) .... One By Mouth Daily 7)  Bioidentical Hormones .... Perform Swab Testing and Compound Bioidential Hormone Creams 8)  Unisom 25 Mg Tabs (Doxylamine Succinate (Sleep)) .Marland Kitchen.. 1 At Bedtime  As Needed Sleep 9)  Seroquel 25 Mg Tabs (Quetiapine Fumarate) .... One At Bedtime 10)  Multivitamins  Caps (Multiple Vitamin) .Marland Kitchen.. 1 Once Daily 11)  Vitamin D 400 Unit Tabs (Cholecalciferol) .Marland Kitchen.. 1 Three Times A Day 12)  Magnesium .Marland Kitchen.. 800/day 13)  Omega-3 350 Mg Caps (Omega-3 Fatty Acids) .... 3 Per Day 14)  Grenlip Muscle Supplement For Osteo Arthritis .Marland Kitchen.. 1 Once Daily  Allergies (verified): 1)  ! Penicillin  Past History:  Family History:    Family History Hypertension    Family History Other cancer-Breast    Family History Ovarian cancer    Family History of Stroke F 1st degree relative <60    Family History Uterine cancer    Fam hx MI    Fam hx CHF  (11/09/2006)  Social History:    Never Smoked    Alcohol use-yes    Drug use-no     (11/09/2006)  Risk Factors:    Alcohol Use: N/A    >5 drinks/d w/in last 3 months: N/A    Caffeine Use: N/A    Diet: N/A    Exercise: N/A  Risk Factors:    Smoking Status: never (11/09/2006)    Packs/Day: N/A    Cigars/wk: N/A    Pipe Use/wk: N/A    Cans of tobacco/wk: N/A    Passive Smoke Exposure: N/A  Past medical, surgical, family and social histories (including risk factors) reviewed, and no changes noted (except as noted below).  Past Medical History:    Reviewed history from 04/06/2007 and no changes required:    Unremarkable  Past Surgical History:    Reviewed history from 04/06/2007 and no changes required:    Toe  Family History:    Reviewed history from 11/09/2006 and no changes required:       Family History Hypertension       Family History Other cancer-Breast       Family History Ovarian cancer  Family History of Stroke F 1st degree relative <60       Family History Uterine cancer       Fam hx MI       Fam hx CHF  Social History:    Reviewed history from 11/09/2006 and no changes required:       Never Smoked       Alcohol use-yes       Drug use-no  Review of Systems  The patient denies anorexia, fever, weight loss, weight gain, vision loss, decreased hearing, hoarseness, chest pain, syncope, dyspnea on exertion, peripheral edema, prolonged cough, headaches, hemoptysis, abdominal pain, melena, hematochezia, severe indigestion/heartburn, hematuria, incontinence, genital sores, muscle weakness, suspicious skin lesions, transient blindness, difficulty walking, depression, unusual weight change, abnormal bleeding, enlarged lymph nodes, angioedema, and breast masses.    Physical Exam  General:  Well-developed,well-nourished,in no acute distress; alert,appropriate and cooperative throughout examination Head:  Normocephalic and atraumatic without obvious  abnormalities. No apparent alopecia or balding. Ears:  External ear exam shows no significant lesions or deformities.  Otoscopic examination reveals clear canals, tympanic membranes are intact bilaterally without bulging, retraction, inflammation or discharge. Hearing is grossly normal bilaterally. Nose:  External nasal examination shows no deformity or inflammation. Nasal mucosa are pink and moist without lesions or exudates. Mouth:  Oral mucosa and oropharynx without lesions or exudates.  Teeth in good repair. Neck:  No deformities, masses, or tenderness noted. Lungs:  Normal respiratory effort, chest expands symmetrically. Lungs are clear to auscultation, no crackles or wheezes. Heart:  Normal rate and regular rhythm. S1 and S2 normal without gallop, murmur, click, rub or other extra sounds. Abdomen:  Bowel sounds positive,abdomen soft and non-tender without masses, organomegaly or hernias noted.   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  will stop the HRT due to the famil breast cancer risk  Problem # 3:  NEOPLASM, MALIGNANT, BREAST, FAMILY HX (ICD-V16.3) Assessment: Comment Only the pt willl stop HRT  Problem # 4:  LIVER FUNCTION TESTS, ABNORMAL (ICD-794.8)  Advised patient to avoid alcohol and Tylenol. Call for worsening symptoms.   Complete Medication List: 1)  Co Q-10 120 Mg Caps (Coenzyme q10) .Marland Kitchen.. 1 by mouth once daily 2)  Flax Seed Oil 1000 Mg Caps (flaxseed (linseed)) Meal  .... 1 by mouth once daily 3)  Calcium 500/d 500-200 Mg-unit Tabs (Calcium carbonate-vitamin d) .... 3 by mouth once daily 4)  Klonopin 0.5 Mg Tabs (Clonazepam) .... 1/2 at bedtime as needed 5)  Sonata 10 Mg Caps (Zaleplon) .... One by mouth q hs as needed  insomnia. 6)  Crestor 5 Mg Tabs (Rosuvastatin calcium) .... One by mouth daily 7)  Bioidentical Hormones  .... Perform swab testing and compound bioidential hormone creams 8)  Unisom 25 Mg Tabs (Doxylamine succinate (sleep)) .Marland Kitchen.. 1 at bedtime  as needed sleep 9)  Seroquel 25 Mg Tabs (Quetiapine fumarate) .... One at bedtime 10)  Multivitamins Caps (Multiple vitamin) .Marland Kitchen.. 1 once daily 11)  Vitamin D 400 Unit Tabs (Cholecalciferol) .Marland Kitchen.. 1 three times a day 12)  Magnesium  .Marland Kitchen.. 800/day 13)  Omega-3 350 Mg Caps (Omega-3 fatty acids) .... 3 per day 14)  Grenlip Muscle Supplement For Osteo Arthritis  .Marland Kitchen.. 1 once daily  Patient Instructions: 1)  Stopping the  hormones now, continue the 1/2 dose of  the seroquil and titrate it to a whole pill if needs and moniter the dizzyness 2)  Please schedule a follow-up appointment in 2 months. 3)  No HOT yoga Prescriptions: MULTIVITAMINS  CAPS (MULTIPLE VITAMIN) 1 once daily  #30 x 6   Entered by:   Willy Eddy, LPN   Authorized by:   Stacie Glaze MD   Signed by:   Willy Eddy, LPN on 19/14/7829   Method used:   [facility]        [facility] [address] [phone]* (retail)       [address]       [place], [place]  [address]       Ph: [phone] or [phone]       Fax: [phone]   RxID:   [id number]   Appended Document: Orders Update    Clinical Lists Changes  Orders: Added new Service order of Est. Patient Level IV (32951) - Signed

## 2010-05-18 NOTE — Miscellaneous (Signed)
Summary: PT REFERRAL  Clinical Lists Changes  Orders: Added new Referral order of Physical Therapy Referral (PT) - Signed 

## 2010-05-18 NOTE — Progress Notes (Signed)
Summary: medication LMTCB 3-1  Phone Note Call from Patient   Caller: Patient Call For: Dr. Lovell Sheehan Reason for Call: Acute Illness Summary of Call: Pt wants to cut back on her Buspirone it 1/2 in the am.  Makes her feel "loopy', and is not helping her insomnia at all.  Still has to take Temazapam or Sonata. Leave message [phone] Initial call taken by: Lynann Beaver CMA,  June 16, 2008 9:58 AM  Follow-up for Phone Call        fine... sometimes it pays to take it slow with these medicatioins Follow-up by: Stacie Glaze MD,  June 16, 2008 1:25 PM  Additional Follow-up for Phone Call Additional follow up Details #1::        LMTCB Additional Follow-up by: Lynann Beaver CMA,  June 16, 2008 1:53 PM    Additional Follow-up for Phone Call Additional follow up Details #2::    left message on machine per pt instruction per what dr Lovell Sheehan instructed Follow-up by: Willy Eddy, LPN,  June 16, 2008 2:36 PM

## 2010-05-18 NOTE — Consult Note (Signed)
Summary: letter  letter   Imported By: Kassie Mends 11/26/2007 10:27:50  _____________________________________________________________________  External Attachment:    Type:   Image     Comment:   letter

## 2010-05-18 NOTE — Assessment & Plan Note (Signed)
Summary: f/u,mc   History of Present Illness: Knee pain is unimproved in 4 mos of doing exercises not much swelling more pain in front hard to get full extension did step class and knee felt full  has done exercises and ridden bike used strap nothing has helped that much  Allergies: 1)  ! Penicillin  Physical Exam  General:  Well-developed,well-nourished,in no acute distress; alert,appropriate and cooperative throughout examination Msk:  left knee lags full extension by 5 deg she can force this to only 1 to 2 deg this makes left leg functionally 1.5 cm shorter and that is noted on walking gait no limp  knee exam shows no effusion; stable ligaments; negative Mcmurray's and provocative meniscal tests;  somewhat painful patellar compression; patellar and quadriceps tendons unremarkable.  RT knee shows normal exam  Additional Exam:  Xrays  normal cartilage space only mild PF DJD and no large  spurs no fxs   Impression & Recommendations:  Problem # 1:  KNEE PAIN (ICD-719.46)  Her updated medication list for this problem includes:    Tramadol Hcl 50 Mg Tabs (Tramadol hcl) ..... One tab two times a day.  Orders: Radiology other (Radiology Other) Garment,belt,sleeve or other covering ,elastic or similar stretch (D6644)  Xray is not impressive will keep on exerecises but discuss PT w patient and other options  f/u in 8 wks  Problem # 2:  DEGENERATIVE JOINT DISEASE, RIGHT SHOULDER (ICD-715.91)  Her updated medication list for this problem includes:    Tramadol Hcl 50 Mg Tabs (Tramadol hcl) ..... One tab two times a day.  Orders: Radiology other (Radiology Other)  This is still  very functional not sure what we would change based on xrays and suggested following exam unless painful  Complete Medication List: 1)  Calcium-vitamin D 250-125 Mg-unit Tabs (Calcium carbonate-vitamin d) .Marland Kitchen.. 1 once daily 2)  Sonata 10 Mg Caps (Zaleplon) .... One by mouth q hs as needed  insomnia. 3)  Magnesium  .Marland Kitchen.. 800/day 4)  Omega-3 350 Mg Caps (Omega-3 fatty acids) .... 3 per day 5)  Grenlip Muscle Supplement For Osteo Arthritis  .Marland Kitchen.. 1 once daily 6)  Niacinamide 100 Mg Tabs (Niacinamide) .Marland Kitchen.. 1 once daily 7)  Zolpidem Tartrate 12.5 Mg Cr-tabs (Zolpidem tartrate) .... One by mouth q hs to use when sonata fails 8)  Bromellian  .... For shoulder 9)  Tramadol Hcl 50 Mg Tabs (Tramadol hcl) .... One tab two times a day.   Orders Added: 1)  Radiology other [Radiology Other] 2)  Garment,belt,sleeve or other covering ,elastic or similar stretch [A4466] 3)  Est. Patient Level III [03474]

## 2010-05-18 NOTE — Progress Notes (Signed)
Summary: Sonata  Phone Note Call from Patient   Caller: Patient Summary of Call: Sonata refill.........Marland Kitchen5 mg or 10 mg..........[facility] tomorrow[facility]  [phone] Initial call taken by: Lynann Beaver CMA,  November 02, 2007 9:20 AM  Follow-up for Phone Call        Per Dr. Lovell Sheehan ....Marland KitchenMarland KitchenSonata 10 mg. #20 one refill [facility]) Pt notified. Follow-up by: Lynann Beaver CMA,  November 02, 2007 9:24 AM    New/Updated Medications: SONATA 10 MG  CAPS (ZALEPLON) one by mouth q hs as needed insomnia.   Prescriptions: SONATA 10 MG  CAPS (ZALEPLON) one by mouth q hs as needed insomnia.  #20 x 1   Entered by:   Lynann Beaver CMA   Authorized by:   Stacie Glaze MD   Signed by:   Lynann Beaver CMA on 11/02/2007   Method used:   Telephoned to ...       [facility] [address] [phone]*       [address]       [place], [place]  [address]       Ph: [phone] or [phone]       Fax: [phone]   RxID:   [id number]

## 2010-05-20 NOTE — Progress Notes (Signed)
  Phone Note Call from Patient Call back at Home Phone [phone]   Caller: Patient Call For: Stacie Glaze MD Summary of Call: Please call bone density results and may leave a message.  Initial call taken by: [facility] CMA AAMA,  May 04, 2010 10:56 AM  Follow-up for Phone Call        .left message on machine per dr Lovell Sheehan- still osteopenic ,but has gotten worse and needs to make ov to discuss plan of action. Follow-up by: Willy Eddy, LPN,  May 04, 2010 12:36 PM

## 2010-05-20 NOTE — Assessment & Plan Note (Signed)
Summary: cpx//ccm   Vital Signs:  Patient profile:   65 year old female Height:      65.5 inches Weight:      126 pounds BMI:     20.72 Temp:     98.9 degrees F oral Pulse rate:   72 / minute Resp:     14 per minute BP sitting:   120 / 72  (left arm)  Vitals Entered By: Willy Eddy, LPN (March 19, 2010 8:41 AM)  CC: cpx Is Patient Diabetic? No   Primary Care Provider:  Stacie Glaze MD  CC:  cpx.  History of Present Illness: The pt was asked about all immunizations, health maint. services that are appropriate to their age and was given guidance on diet exercize  and weight management   Preventive Screening-Counseling & Management  Alcohol-Tobacco     Smoking Status: never  Problems Prior to Update: 1)  Degenerative Joint Disease, 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 of Unspecified Osteoporosis  (ICD-733.00) 4)  Allergic Rhinitis Cause Unspecified  (ICD-477.9) 5)  Constipation, Intermittent  (ICD-564.00) 6)  Memory Loss  (ICD-780.93) 7)  Insomnia  (ICD-780.52) 8)  Neoplasm, Malignant, Breast, Family Hx  (ICD-V16.3) 9)  Hyperlipidemia  (ICD-272.4) 10)  Symptomatic Menopausal/female Climacteric States  (ICD-627.2) 11)  Preventive Health Care  (ICD-V70.0) 12)  Organic Insomnia Unspecified  (ICD-327.00) 13)  Liver Function Tests, Abnormal  (ICD-794.8) 14)  Physical Examination, Normal  (ICD-V70.0)  Current Problems (verified): 1)  Degenerative Joint Disease, 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 of Unspecified Osteoporosis  (ICD-733.00) 4)  Allergic Rhinitis Cause Unspecified  (ICD-477.9) 5)  Constipation, Intermittent  (ICD-564.00) 6)  Memory Loss  (ICD-780.93) 7)  Insomnia  (ICD-780.52) 8)  Neoplasm, Malignant, Breast, Family Hx  (ICD-V16.3) 9)  Hyperlipidemia  (ICD-272.4) 10)  Symptomatic Menopausal/female Climacteric States  (ICD-627.2) 11)  Preventive Health Care  (ICD-V70.0) 12)  Organic  Insomnia Unspecified  (ICD-327.00) 13)  Liver Function Tests, Abnormal  (ICD-794.8) 14)  Physical Examination, Normal  (ICD-V70.0)  Medications Prior to Update: 1)  Calcium-Vitamin D 250-125 Mg-Unit Tabs (Calcium Carbonate-Vitamin D) .Marland Kitchen.. 1 Once Daily 2)  Sonata 10 Mg  Caps (Zaleplon) .... One By Mouth Q Hs As Needed Insomnia. 3)  Magnesium .Marland Kitchen.. 800/day 4)  Omega-3 350 Mg Caps (Omega-3 Fatty Acids) .... 3 Per Day 5)  Grenlip Muscle Supplement For Osteo Arthritis .Marland Kitchen.. 1 Once Daily 6)  Niacinamide 100 Mg Tabs (Niacinamide) .Marland Kitchen.. 1 Once Daily 7)  Zolpidem Tartrate 12.5 Mg Cr-Tabs (Zolpidem Tartrate) .... One By Mouth Q Hs To Use When Sonata Fails 8)  Bromellian .... For Shoulder 9)  Tramadol Hcl 50 Mg Tabs (Tramadol Hcl) .... One Tab Two Times A Day.  Current Medications (verified): 1)  Calcium-Vitamin D 250-125 Mg-Unit Tabs (Calcium Carbonate-Vitamin D) .Marland Kitchen.. 1 Once Daily 2)  Sonata 10 Mg  Caps (Zaleplon) .... One By Mouth Q Hs As Needed Insomnia. 3)  Magnesium .Marland Kitchen.. 800/day 4)  Omega-3 350 Mg Caps (Omega-3 Fatty Acids) .... 3 Per Day 5)  Zolpidem Tartrate 12.5 Mg Cr-Tabs (Zolpidem Tartrate) .... One By Mouth Q Hs To Use When Sonata Fails 6)  Bromellian .... For Shoulder  Allergies (verified): 1)  ! Penicillin  Contraindications/Deferment of Procedures/Staging:    Test/Procedure: PAP Smear    Reason for deferment: hysterectomy     Test/Procedure: FLU VAX    Reason for deferment: patient declined  Past History:  Family History: Last updated: 11/09/2006 Family History Hypertension Family History Other cancer-Breast Family History Ovarian cancer Family History of Stroke F 1st degree relative <60 Family History Uterine cancer Fam hx MI Fam hx CHF  Social History: Last updated: 11/09/2006 Never Smoked Alcohol use-yes Drug use-no  Risk Factors: Smoking Status: never (03/19/2010)  Past medical, surgical, family and social histories (including risk factors) reviewed, and no  changes noted (except as noted below).  Past Medical History: Reviewed history from 04/06/2007 and no changes required. Unremarkable  Past Surgical History: Reviewed history from 04/06/2007 and no changes required. Toe  Family History: Reviewed history from 11/09/2006 and no changes required. Family History Hypertension Family History Other cancer-Breast Family History Ovarian cancer Family History of Stroke F 1st degree relative <60 Family History Uterine cancer Fam hx MI Fam hx CHF  Social History: Reviewed history from 11/09/2006 and no changes required. Never Smoked Alcohol use-yes Drug use-no  Review of Systems  The patient denies anorexia, fever, weight loss, weight gain, vision loss, decreased hearing, hoarseness, chest pain, syncope, dyspnea on exertion, peripheral edema, prolonged cough, headaches, hemoptysis, abdominal pain, melena, hematochezia, severe indigestion/heartburn, hematuria, incontinence, genital sores, muscle weakness, suspicious skin lesions, transient blindness, difficulty walking, depression, unusual weight change, abnormal bleeding, enlarged lymph nodes, angioedema, and breast masses.    Physical Exam  General:  Well-developed,well-nourished,in no acute distress; alert,appropriate and cooperative throughout examination Head:  Normocephalic and atraumatic without obvious abnormalities. No apparent alopecia or balding. Eyes:  vision grossly intact, pupils equal, and pupils round.   Ears:  R ear normal and L ear normal.   Nose:  no external deformity and no nasal discharge.   Mouth:  pharynx pink and moist and no erythema.   Lungs:  Normal respiratory effort, chest expands symmetrically. Lungs are clear to auscultation, no crackles or wheezes. Heart:  Normal rate and regular rhythm. S1 and S2 normal without gallop, murmur, click, rub or other extra sounds. Abdomen:  Bowel sounds positive,abdomen soft and non-tender without masses, organomegaly or  hernias noted.   Impression & Recommendations:  Problem # 1:  HYPERLIPIDEMIA (ICD-272.4)  The following medications were removed from the medication list:    Niacinamide 100 Mg Tabs (Niacinamide) .Marland Kitchen... 1 once daily  Labs Reviewed: SGOT: 32 (02/23/2010)   SGPT: 42 (02/23/2010)  Lipid Goals: Chol Goal: 200 (09/16/2008)   HDL Goal: 40 (09/16/2008)   LDL Goal: 160 (09/16/2008)   TG Goal: 150 (09/16/2008)  Prior 10 Yr Risk Heart Disease: 2 % (09/16/2008)   HDL:99.30 (02/23/2010), 101.20 (09/04/2009)  LDL:89 (02/07/2008), 103 (36/64/4034)  Chol:269 (02/23/2010), 293 (09/04/2009)  Trig:69.0 (02/23/2010), 55.0 (03/03/2009)  Problem # 2:  LIVER FUNCTION TESTS, ABNORMAL (ICD-794.8)  Advised patient to avoid alcohol and Tylenol. Call for worsening symptoms.   Complete Medication List: 1)  Calcium-vitamin D 250-125 Mg-unit Tabs (Calcium carbonate-vitamin d) .Marland Kitchen.. 1 once daily 2)  Sonata 10 Mg Caps (Zaleplon) .... One by mouth q hs as needed insomnia. 3)  Magnesium  .Marland Kitchen.. 800/day 4)  Omega-3 350 Mg Caps (Omega-3 fatty acids) .... 3 per day 5)  Zolpidem Tartrate 12.5 Mg Cr-tabs (Zolpidem tartrate) .... One by mouth q hs to use when sonata fails 6)  Bromellian  .... For shoulder  Patient Instructions: 1)  Please schedule a follow-up appointment in 6 months. 2)  Hepatic Panel prior to visit, ICD-9:790.4   Orders Added: 1)  Est. Patient 40-64 years [phone]

## 2010-05-21 NOTE — Letter (Signed)
Summary: [facility]/Hormone Evaluation  [facility]/Hormone Evaluation   Imported By: Maryln Gottron 03/10/2008 15:08:27  _____________________________________________________________________  External Attachment:    Type:   Image     Comment:   External Document

## 2010-06-03 NOTE — Assessment & Plan Note (Signed)
Summary: discuss abnormal bone density/bmw   Vital Signs:  Patient profile:   65 year old female Height:      65.5 inches Weight:      126 pounds BMI:     20.72 Temp:     98.2 degrees F oral Pulse rate:   72 / minute Resp:     14 per minute BP sitting:   110 / 70  (left arm)  Vitals Entered By: Willy Eddy, LPN (May 07, 2010 10:52 AM) CC: to d iscuss bone density, Lipid Management Is Patient Diabetic? No   Primary Care Provider:  Stacie Glaze MD  CC:  to d iscuss bone density and Lipid Management.  History of Present Illness: has family hx of ospteoporosis, gradual progressive loss of bone over 8 years. Has fear of bisphosphanates ( foxamax) the pt has light skin , thin and blue eyes. She exercizes well and vit D is good very resistant to medications use...  Lipid Management History:      Positive NCEP/ATP III risk factors include female age 50 years old or older.  Negative NCEP/ATP III risk factors include no history of early menopause without estrogen hormone replacement, non-diabetic, HDL cholesterol greater than 60, non-tobacco-user status, non-hypertensive, no ASHD (atherosclerotic heart disease), no prior stroke/TIA, no peripheral vascular disease, and no history of aortic aneurysm.     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 1 Once Daily 2)  Sonata 10 Mg  Caps (Zaleplon) .... One By Mouth Q Hs As Needed Insomnia. 3)  Magnesium .Marland Kitchen.. 800/day 4)  Omega-3 350 Mg Caps (Omega-3 Fatty Acids) .... 3 Per Day 5)  Zolpidem Tartrate 12.5 Mg Cr-Tabs (Zolpidem Tartrate) .... One By Mouth Q Hs To Use When Sonata Fails 6)  Bromellian .... For Shoulder  Current Medications (verified): 1)  Calcium-Vitamin D 250-125 Mg-Unit Tabs (Calcium Carbonate-Vitamin D) .Marland Kitchen.. 1 Once Daily 2)  Sonata 10 Mg  Caps (Zaleplon) .... One By Mouth Q Hs As Needed Insomnia. 3)  Magnesium .Marland Kitchen.. 800/day 4)  Omega-3 350 Mg Caps (Omega-3 Fatty Acids) .... 3 Per Day 5)  Zolpidem Tartrate 12.5  Mg Cr-Tabs (Zolpidem Tartrate) .... One By Mouth Q Hs To Use When Sonata Fails 6)  Bromellian .... For Shoulder  Allergies (verified): 1)  ! Penicillin  Past History:  Family History: Last updated: 11/09/2006 Family History Hypertension Family History Other cancer-Breast Family History Ovarian cancer Family History of Stroke F 1st degree relative <60 Family History Uterine cancer Fam hx  MI Fam hx CHF  Social History: Last updated: 11/09/2006 Never Smoked Alcohol use-yes Drug use-no  Risk Factors: Smoking Status: never (05/07/2010)  Past medical, surgical, family and social histories (including risk factors) reviewed, and no changes noted (except as noted below).  Past Medical History: Reviewed history from 04/06/2007 and no changes required. Unremarkable  Past Surgical History: Reviewed history from 04/06/2007 and no changes required. Toe PMH-FH-SH reviewed-no changes except otherwise noted  Family History: Reviewed history from 11/09/2006 and no changes required. Family History Hypertension Family History Other cancer-Breast Family History Ovarian cancer Family History of Stroke F 1st degree relative <60 Family History Uterine cancer Fam hx MI Fam hx CHF  Social History: Reviewed history from 11/09/2006 and no changes required. Never Smoked Alcohol use-yes Drug use-no  Review of Systems  The patient denies anorexia, fever, weight loss, weight gain, vision loss, decreased hearing, hoarseness, chest pain, syncope, dyspnea on exertion, peripheral edema, prolonged cough, headaches, hemoptysis, abdominal pain, melena, hematochezia, severe indigestion/heartburn, hematuria, incontinence, genital sores, muscle weakness, suspicious skin lesions, transient blindness, difficulty walking, depression, unusual weight change, abnormal bleeding, enlarged lymph nodes, angioedema, and breast masses.    Physical Exam  General:  Well-developed,well-nourished,in no acute distress; alert,appropriate and cooperative throughout examination Head:  Normocephalic and atraumatic without obvious abnormalities. No apparent alopecia or balding. Eyes:  vision grossly intact, pupils equal, and pupils round.   Ears:  R ear normal and L ear normal.   Nose:  no external deformity and no nasal discharge.   Mouth:  pharynx pink and moist and no erythema.   Neck:  No deformities,  masses, or tenderness noted. Lungs:  Normal respiratory effort, chest expands symmetrically. Lungs are clear to auscultation, no crackles or wheezes. Heart:  Normal rate and regular rhythm. S1 and S2 normal without gallop, murmur, click, rub or other extra sounds. Abdomen:  Bowel sounds positive,abdomen soft and non-tender without masses, organomegaly or hernias noted. Msk:  no joint tenderness and no redness over joints.   Pulses:  R and L carotid,radial,femoral,dorsalis pedis and posterior tibial pulses are full and equal bilaterally Extremities:  No clubbing, cyanosis, edema, or deformity noted with normal full range of motion of all joints.   Neurologic:  alert & oriented X3 and DTRs symmetrical and normal.     Impression & Recommendations:  Problem # 1:  OSTEOPENIA (ICD-733.90)  Discussed medication use, applications of heat or ice, and exercises.   Problem # 2:  DEGENERATIVE JOINT DISEASE, RIGHT SHOULDER (ICD-715.91) Assessment: Deteriorated referral to sports medicine Discussed use of medications, application of heat or cold, and exercises.   Problem # 3:  INSOMNIA (ICD-780.52)  Her updated medication list for this problem includes:    Sonata 10 Mg Caps (Zaleplon) ..... One by mouth q hs as needed insomnia.    Zolpidem Tartrate 12.5 Mg Cr-tabs (Zolpidem tartrate) ..... One by mouth q hs to use when sonata fails  Discussed sleep hygiene.   Complete Medication List: 1)  Calcium-vitamin D 250-125 Mg-unit Tabs (Calcium carbonate-vitamin d) .Marland Kitchen.. 1 once daily 2)  Sonata 10 Mg Caps (Zaleplon) .... One by mouth q hs as  needed insomnia. 3)  Magnesium  .Marland Kitchen.. 800/day 4)  Omega-3 350 Mg Caps (Omega-3 fatty acids) .... 3 per day 5)  Zolpidem Tartrate 12.5 Mg Cr-tabs (Zolpidem tartrate) .... One by mouth q hs to use when sonata fails 6)  Bromellian  .... For shoulder  Lipid Assessment/Plan:      Based on NCEP/ATP III, the patient's risk factor category is "0-1 risk factors".  The  patient's lipid goals are as follows: Total cholesterol goal is 200; LDL cholesterol goal is 160; HDL cholesterol goal is 40; Triglyceride goal is 150.  Her LDL cholesterol goal has been met.      Orders Added: 1)  Est. Patient Level IV [13086]

## 2010-06-11 ENCOUNTER — Telehealth: Payer: Self-pay | Admitting: Internal Medicine

## 2010-06-11 NOTE — Telephone Encounter (Signed)
Chart was opened in error. 

## 2010-06-15 ENCOUNTER — Encounter: Payer: Self-pay | Admitting: Internal Medicine

## 2010-06-16 ENCOUNTER — Encounter: Payer: Self-pay | Admitting: Internal Medicine

## 2010-06-16 ENCOUNTER — Ambulatory Visit (INDEPENDENT_AMBULATORY_CARE_PROVIDER_SITE_OTHER): Payer: PRIVATE HEALTH INSURANCE | Admitting: Internal Medicine

## 2010-06-16 VITALS — BP 110/68 | HR 68 | Temp 98.2°F | Resp 14 | Ht 65.5 in | Wt 126.0 lb

## 2010-06-16 DIAGNOSIS — J351 Hypertrophy of tonsils: Secondary | ICD-10-CM

## 2010-06-16 NOTE — Patient Instructions (Signed)
Recommend that he use the saltwater lavage at least 3 times a day chronically to avoid the postnasal drip that's inflaming her tonsillar area. With significant rhinitis would use Zyrtec as needed

## 2010-07-19 ENCOUNTER — Ambulatory Visit (INDEPENDENT_AMBULATORY_CARE_PROVIDER_SITE_OTHER): Payer: PRIVATE HEALTH INSURANCE | Admitting: Sports Medicine

## 2010-07-19 ENCOUNTER — Encounter: Payer: Self-pay | Admitting: Sports Medicine

## 2010-07-19 VITALS — BP 110/60 | Ht 65.0 in | Wt 124.0 lb

## 2010-07-19 DIAGNOSIS — M25569 Pain in unspecified knee: Secondary | ICD-10-CM

## 2010-07-19 DIAGNOSIS — M19019 Primary osteoarthritis, unspecified shoulder: Secondary | ICD-10-CM

## 2010-07-19 NOTE — Assessment & Plan Note (Signed)
I reviewed the x-rays of her right shoulder and there are extensive DJD changes present. There is loss of the joint cartilage as well as a number of spurs.  While I want her to keep up her range of motion and strength for her right shoulder I believe she is a good candidate to see another shoulder surgeon to consider options. She did see Dr. Ave Filter but felt like he offered her only limited options. She would like to get a second opinion regarding this.

## 2010-07-19 NOTE — Patient Instructions (Addendum)
Continue a few of the standard knee exercises and biking for knee  Maintain as much shoulder range of motion as possible without pushing it through the bony endpoints.  Your appointment with Dr. Evonnie Dawes for rt shoulder eval is 07/24/10 at 2:45pm.

## 2010-07-19 NOTE — Assessment & Plan Note (Signed)
She has gradually increased the strength all around the left knee and hip area. This has not helped her regain full extension of the left knee but the knee is more stable on examination. I encouraged her to continue keeping the strength exercises at a reasonable level so that she does not get worse symptoms. I think biking is a good option for her and will not put too much pressure on the knee. I do not think there are other specific therapies warranted at this time and I will recheck if there is a change in the status.

## 2010-07-19 NOTE — Progress Notes (Signed)
  Subjective:    Patient ID: Jennifer [name], female    DOB: [birth year], 65 y.o.   MRN: [id number]  HPI  The patient is here today to followup on her left knee and her right shoulder. Patient states that her left knee hasn't gotten any better since the last visit. She has faithfully done her knee exercises and did go to PT with Sharen Hones. Does not limit her from normal activities but she does note a catching and sometimes sharp pain on the upper outer corner of the knee cap.   As far as her right shoulder she wants options for what she should do since she "bone-on-bone"and the shoulder is still causing her problems. Patient was wondering if she needed an ultrasound or MRI as a another option but also wants to know if she needs surgery what doctors we recommend. She had arthroscopic surgery by Dr. Dione Housekeeper at [facility] a number of years ago. At that time she already had significant arthritic changes. There was no specific known injury but she was very active in tennis for years.  Does yoga, recumbent bike, step class       Review of Systems     Objective:   Physical Exam    Lacks 3 deg full extension lt knee Lt knee- McMurray's neg, but has pat femoral crepitation. All ligaments stable, no effusion, no baker's cyst. Lt hip abduction and adduction strong, good quad strength Lt hip flexion strong   Rt shoulder forward flexion lacks 20 degrees, 120 deg of adduction and elevation on rt, full on lt Back scratch can get to lumbar area with rt hand, gets to upper scapula with lt hand 60 deg ER of lt shoulder, 15 deg ER of rt shoulder Strength good rt shoulder all positions below shoulder height   Assessment & Plan:

## 2010-07-19 NOTE — Progress Notes (Signed)
The patient is here today to followup on her left knee and her right shoulder. Patient states that her left knee hasn't  gotten any better since the last visit. As far as her right shoulder she wants options for what she should do since she "bone-on-bone"and the shoulder is still causing her problems. Patient was wondering if she needed an ultrasound or MRI as a another option but also wants to know if she needs surgery what doctors we recommend

## 2010-08-31 NOTE — Procedures (Signed)
Jennifer [name], DEMAURO NO.:  [id number]   MEDICAL RECORD NO.:  [id number]          PATIENT TYPE:  OUT   LOCATION:  SLEEP CENTER                 FACILITY:  [facility]   PHYSICIAN:  Barbaraann Share, MD,FCCPDATE OF BIRTH:  1946-06-02   DATE OF STUDY:  05/13/2008                            NOCTURNAL POLYSOMNOGRAM   REFERRING PHYSICIAN:  Stacie Glaze, MD   LOCATION:  Sleep Lab.   INDICATION FOR STUDY:  Unspecified sleep disturbance 780.50.   EPWORTH SLEEPINESS SCORE:  6.   MEDICATIONS:   SLEEP ARCHITECTURE:  The patient had total sleep time of 352 minutes,  however, achieved no slow wave sleep and had decreased quantity of REM.  Sleep onset latency was prolonged at 70 minutes, and REM onset was at  the upper limits of normal at 121 minutes.  Sleep efficiency was  decreased at 83%.   RESPIRATORY DATA:  The patient was found to have no obstructive apneas,  hypopneas, or respiratory effort-related arousals.  She did have mild  snoring noted.   OXYGEN DATA:  There was minimal desaturation as low as 94% during the  night, which is considered normal.   CARDIAC DATA:  No clinically significant arrhythmias seen.   MOVEMENT-PARASOMNIA:  The patient had no significant leg jerks or other  abnormal behaviors noted.   IMPRESSIONS-RECOMMENDATIONS:  No evidence for clinically significant  sleep disordered breathing.  However, the patient did have very large  numbers of nonspecific arousals.  This can sometimes be associated with  chronic pain, anxiety, reflux, or possibly environmental.  Clinical  correlation is suggested.      Barbaraann Share, MD,FCCP  Diplomate, American Board of Sleep  Medicine  Electronically Signed    [facility]/MEDQ  D:  05/17/2008 15:10:05  T:  05/18/2008 04:25:45  Job:  [id number]

## 2010-09-03 NOTE — Procedures (Signed)
[facility]  Patient:    Jennifer [name], Jennifer [name]                          MRN: [id number] Proc. Date: 11/22/99 Adm. Date:  [id number] Attending:  Deneen Harts CC:         Ammie Dalton, M.D.                           Procedure Report  PROCEDURE:  Colonoscopy.  INDICATION FOR PROCEDURE:  A 65 year old white female undergoing colonoscopy for neoplasia surveillance. Asymptomatic. No family history of colorectal neoplasia.  DESCRIPTION OF PROCEDURE:  After reviewing the nature of the procedure with the patient including potential risks and complications, and after discussing alternative methods of diagnosis and treatment, informed consent was signed.  The patient received no IV sedation per her request.  Using an Olympus pediatric PCF-140L video colonoscope, the rectum was intubated after a normal digital examination which revealed no evidence of perianal or intrarectal pathology.  The scope was inserted and advanced under direct vision around the entire length of the colon to the cecum identified by the appendiceal orifice and the ileocecal valve. Good preparation throughout. The scope was slowly withdrawn with careful inspection of the entire colon in a retrograde manner including retroflexed view in the rectal vault.  No abnormality was noted. Specifically, there was no evidence of neoplasia, diverticular disease, mucosal inflammation or vascular abnormality. The colon was decompressed, scope withdrawn.  The patient tolerated the procedure without difficulty being maintained on DataScope monitor and low flow oxygen throughout.  Time 2, technical 1, preparation 2, total score equal 5.  ASSESSMENT:  Normal colonoscopy--no evidence of neoplasia.  RECOMMENDATIONS: 1. Annual Hemoccult--per Dr. Ammie Dalton. 2. Colonoscopy--10 years per Dr. Kinnie Scales. DD:  11/22/99 TD:  11/23/99 Job: [id number]

## 2010-09-03 NOTE — Op Note (Signed)
[name], [name]                             ACCOUNT NO.:  [id number]   MEDICAL RECORD NO.:  [id number]                   PATIENT TYPE:  AMB   LOCATION:  SDC                                  FACILITY:  [facility]   PHYSICIAN:  Freddy Finner, M.D.                DATE OF BIRTH:  [birth year]   DATE OF PROCEDURE:  06/13/2002  DATE OF DISCHARGE:                                 OPERATIVE REPORT   PREOPERATIVE DIAGNOSES:  1. Postmenopausal bleeding.  2. Endometrial mass by sonohysterogram.   POSTOPERATIVE DIAGNOSES:  1. Postmenopausal bleeding.  2. Endometrial mass by sonohysterogram.  3. Probable sessile polyp.   OPERATIVE PROCEDURE:  1. Hysteroscopy.  2. Dilatation and curettage.  3. Resection of thickened endometrial area in anterior fundus.   SURGEON:  Freddy Finner, M.D.   ANESTHESIA:  Managed intravenous sedation and paracervical block.   ESTIMATED BLOOD LOSS:  Less than 20 mL.   FLUIDS:  Sorbitol deficit 70 mL.   COMPLICATIONS:  None.   HISTORY:  The patient is a 64 year old known to have uterine leiomyomata  which have had some increase in size in the recent past who presented with  an episode of postmenopausal bleeding and on sonohysterogram had a small  mass measuring less than or equal to 1 cm visible in the endometrial cavity.  She is admitted now for hysteroscopy, D&C.   DESCRIPTION OF PROCEDURE:  She is admitted on the morning of surgery,  brought to the operating room.  There she was placed under adequate  intravenous sedation, placed in the dorsal lithotomy position using the  Allen stirrups system.  Betadine prep of the vulva, perineum, and vagina was  carried out in the usual fashion.  A bivalve speculum was introduced after  placement of sterile drapes.   The cervix was visualized, grasped on the anterior lip with a single tooth  tenaculum.  A paracervical block was then placed using a total of 10 mL of  1% Xylocaine with injections at 8 and 4 in the  vaginal fornices.  The cervix  was progressively dilated to 23 with Pratts.  The 12.5 degree A[facility]I  hysteroscope was introduced using Sorbitol 3% as the distending medium.  There was one tiny polypoid-looking structure on the lower segment to the  patient's left and kind of a thickened sessile type mass on the anterior  fundal surface.   Gentle thorough curettage was carried out removing these areas after placing  the polyp forceps was carried out.  Repeat inspection revealed adequate  sampling with removal of the polyps.  The procedure at this point was  terminated.  All instruments were removed.   The patient was awakened, taken to recovery in good condition.  She will be  discharged with written outpatient surgical instructions.  She has Vicodin  at home to be taken as needed for postoperative pain unrelieved by  over-the-  counter medications.                                               Freddy Finner, M.D.    WRN/MEDQ  D:  06/13/2002  T:  06/13/2002  Job:  [id number]

## 2010-09-22 ENCOUNTER — Other Ambulatory Visit (INDEPENDENT_AMBULATORY_CARE_PROVIDER_SITE_OTHER): Payer: PRIVATE HEALTH INSURANCE

## 2010-09-22 DIAGNOSIS — E785 Hyperlipidemia, unspecified: Secondary | ICD-10-CM

## 2010-09-22 DIAGNOSIS — R7402 Elevation of levels of lactic acid dehydrogenase (LDH): Secondary | ICD-10-CM

## 2010-09-22 DIAGNOSIS — R7401 Elevation of levels of liver transaminase levels: Secondary | ICD-10-CM

## 2010-09-22 LAB — HEPATIC FUNCTION PANEL
ALT: 32 U/L (ref 0–35)
AST: 35 U/L (ref 0–37)
Albumin: 4.3 g/dL (ref 3.5–5.2)
Alkaline Phosphatase: 99 U/L (ref 39–117)
Bilirubin, Direct: 0 mg/dL (ref 0.0–0.3)
Total Bilirubin: 0.5 mg/dL (ref 0.3–1.2)
Total Protein: 6.7 g/dL (ref 6.0–8.3)

## 2010-09-22 LAB — LDL CHOLESTEROL, DIRECT: Direct LDL: 183.1 mg/dL

## 2010-09-22 LAB — LIPID PANEL
Cholesterol: 269 mg/dL — ABNORMAL HIGH (ref 0–200)
HDL: 73.8 mg/dL (ref >39.00)
Total CHOL/HDL Ratio: 4
Triglycerides: 109 mg/dL (ref 0.0–149.0)
VLDL: 21.8 mg/dL (ref 0.0–40.0)

## 2010-09-23 LAB — VITAMIN D 25 HYDROXY (VIT D DEFICIENCY, FRACTURES): Vit D, 25-Hydroxy: 54 ng/mL (ref 30–89)

## 2010-09-27 ENCOUNTER — Telehealth: Payer: Self-pay | Admitting: *Deleted

## 2010-09-27 NOTE — Telephone Encounter (Signed)
Results of labs were mailed to her on Wednesday. She sh ould receive any day.  Dr Lovell Sheehan doesn't discuss labs over the phone.  If she wants to wait and get lab results and if she h as any questions, she can keep appointment, but please try to cancel appointment at least 2 hours prior to appointment

## 2010-09-27 NOTE — Telephone Encounter (Signed)
Pt has an appt on Wed 9am. She would like to have a copy of her labs faxed to her at [phone]. She states that she has signed a release to have this info faxed to her. Also she would like to know if she can cancel her appt on wed and get the results over the phone.

## 2010-09-27 NOTE — Telephone Encounter (Signed)
Pt aware of this. 

## 2010-09-29 ENCOUNTER — Ambulatory Visit: Payer: Self-pay | Admitting: Internal Medicine

## 2010-12-29 ENCOUNTER — Telehealth: Payer: Self-pay

## 2010-12-29 NOTE — Telephone Encounter (Signed)
Pt called the triage line and stated she is going to [place] for a wedding and needs to make sure her immunizations are up to date for her trip.  Pls advise.

## 2010-12-30 NOTE — Telephone Encounter (Signed)
Notified pt. 

## 2010-12-30 NOTE — Telephone Encounter (Signed)
LMTCB

## 2010-12-30 NOTE — Telephone Encounter (Signed)
Review her DPT and see if it's up to date recommend that she be referred to the health medicine and tone to check on need for typhoid or malaria prophylaxis

## 2010-12-30 NOTE — Telephone Encounter (Signed)
Per centricity she had a tdap on 11-10- so she is up to date on that , but please ask her to do what dr Lovell Sheehan requested

## 2011-04-21 ENCOUNTER — Ambulatory Visit (INDEPENDENT_AMBULATORY_CARE_PROVIDER_SITE_OTHER): Payer: PRIVATE HEALTH INSURANCE | Admitting: Internal Medicine

## 2011-04-21 ENCOUNTER — Encounter: Payer: Self-pay | Admitting: Internal Medicine

## 2011-04-21 VITALS — BP 124/76 | HR 72 | Temp 98.0°F | Resp 14 | Ht 65.5 in | Wt 122.0 lb

## 2011-04-21 DIAGNOSIS — M8580 Other specified disorders of bone density and structure, unspecified site: Secondary | ICD-10-CM

## 2011-04-21 DIAGNOSIS — N951 Menopausal and female climacteric states: Secondary | ICD-10-CM

## 2011-04-21 DIAGNOSIS — M899 Disorder of bone, unspecified: Secondary | ICD-10-CM

## 2011-04-21 DIAGNOSIS — Z0000 Encounter for general adult medical examination without abnormal findings: Secondary | ICD-10-CM

## 2011-04-21 DIAGNOSIS — M949 Disorder of cartilage, unspecified: Secondary | ICD-10-CM

## 2011-04-21 LAB — CBC WITH DIFFERENTIAL/PLATELET
Basophils Absolute: 0 10*3/uL (ref 0.0–0.1)
Basophils Relative: 0.3 % (ref 0.0–3.0)
Eosinophils Absolute: 0 10*3/uL (ref 0.0–0.7)
Eosinophils Relative: 0.5 % (ref 0.0–5.0)
HCT: 36.9 % (ref 36.0–46.0)
Hemoglobin: 12.8 g/dL (ref 12.0–15.0)
Lymphocytes Relative: 20.8 % (ref 12.0–46.0)
Lymphs Abs: 1 10*3/uL (ref 0.7–4.0)
MCHC: 34.6 g/dL (ref 30.0–36.0)
MCV: 93.2 fl (ref 78.0–100.0)
Monocytes Absolute: 0.4 10*3/uL (ref 0.1–1.0)
Monocytes Relative: 8.3 % (ref 3.0–12.0)
Neutro Abs: 3.5 10*3/uL (ref 1.4–7.7)
Neutrophils Relative %: 70.1 % (ref 43.0–77.0)
Platelets: 258 10*3/uL (ref 150.0–400.0)
RBC: 3.96 Mil/uL (ref 3.87–5.11)
RDW: 13.5 % (ref 11.5–14.6)
WBC: 5 10*3/uL (ref 4.5–10.5)

## 2011-04-21 LAB — LIPID PANEL
Cholesterol: 270 mg/dL — ABNORMAL HIGH (ref 0–200)
HDL: 90.2 mg/dL (ref >39.00)
Total CHOL/HDL Ratio: 3
Triglycerides: 75 mg/dL (ref 0.0–149.0)
VLDL: 15 mg/dL (ref 0.0–40.0)

## 2011-04-21 LAB — HEPATIC FUNCTION PANEL
ALT: 47 U/L — ABNORMAL HIGH (ref 0–35)
AST: 38 U/L — ABNORMAL HIGH (ref 0–37)
Albumin: 4.3 g/dL (ref 3.5–5.2)
Alkaline Phosphatase: 100 U/L (ref 39–117)
Bilirubin, Direct: 0 mg/dL (ref 0.0–0.3)
Total Bilirubin: 0.5 mg/dL (ref 0.3–1.2)
Total Protein: 6.6 g/dL (ref 6.0–8.3)

## 2011-04-21 LAB — BASIC METABOLIC PANEL
BUN: 15 mg/dL (ref 6–23)
CO2: 26 mEq/L (ref 19–32)
Calcium: 9.6 mg/dL (ref 8.4–10.5)
Chloride: 109 mEq/L (ref 96–112)
Creatinine, Ser: 0.8 mg/dL (ref 0.4–1.2)
GFR: 77.86 mL/min (ref >60.00)
Glucose, Bld: 89 mg/dL (ref 70–99)
Potassium: 4.9 mEq/L (ref 3.5–5.1)
Sodium: 142 mEq/L (ref 135–145)

## 2011-04-21 LAB — POCT URINALYSIS DIPSTICK
Bilirubin, UA: NEGATIVE
Blood, UA: NEGATIVE
Glucose, UA: NEGATIVE
Ketones, UA: NEGATIVE
Leukocytes, UA: NEGATIVE
Nitrite, UA: NEGATIVE
Protein, UA: NEGATIVE
Spec Grav, UA: 1.015
Urobilinogen, UA: 0.2
pH, UA: 8

## 2011-04-21 LAB — CALCIUM: Calcium: 9.6 mg/dL (ref 8.4–10.5)

## 2011-04-21 LAB — TSH: TSH: 0.9 u[IU]/mL (ref 0.35–5.50)

## 2011-04-21 LAB — LDL CHOLESTEROL, DIRECT: Direct LDL: 154.6 mg/dL

## 2011-04-21 MED ORDER — CIPROFLOXACIN HCL 500 MG PO TABS: 500.0000 mg | ORAL_TABLET | Freq: Two times a day (BID) | ORAL | Status: AC

## 2011-04-21 MED ORDER — ZALEPLON 10 MG PO CAPS: 10.0000 mg | ORAL_CAPSULE | Freq: Every evening | ORAL | Status: DC | PRN

## 2011-04-21 MED ORDER — ZOLPIDEM TARTRATE ER 12.5 MG PO TBCR: 12.5000 mg | EXTENDED_RELEASE_TABLET | Freq: Every evening | ORAL | Status: DC | PRN

## 2011-04-21 MED ORDER — PROMETHAZINE HCL 25 MG PO TABS: 25.0000 mg | ORAL_TABLET | Freq: Three times a day (TID) | ORAL | Status: AC | PRN

## 2011-04-21 NOTE — Patient Instructions (Signed)
The patient is instructed to continue all medications as prescribed. Schedule followup with check out clerk upon leaving the clinic  

## 2011-04-21 NOTE — Progress Notes (Signed)
Subjective:    Patient ID: Jennifer [name], female    DOB: 1945/06/30, 66 y.o.   MRN: [id number]  HPI Here for CPX No complaints sleeping better on holistic medications   Review of Systems  Constitutional: Negative for activity change, appetite change and fatigue.  HENT: Negative for ear pain, congestion, neck pain, postnasal drip and sinus pressure.   Eyes: Negative for redness and visual disturbance.  Respiratory: Negative for cough, shortness of breath and wheezing.   Gastrointestinal: Negative for abdominal pain and abdominal distention.  Genitourinary: Negative for dysuria, frequency and menstrual problem.  Musculoskeletal: Negative for myalgias, joint swelling and arthralgias.  Skin: Negative for rash and wound.  Neurological: Negative for dizziness, weakness and headaches.  Hematological: Negative for adenopathy. Does not bruise/bleed easily.  Psychiatric/Behavioral: Negative for sleep disturbance and decreased concentration.   No past medical history on file.  History   Social History  . Marital Status: Married    Spouse Name: N/A    Number of Children: N/A  . Years of Education: N/A   Occupational History  . Not on file.   Social History Main Topics  . Smoking status: Never Smoker   . Smokeless tobacco: Not on file  . Alcohol Use: Yes  . Drug Use: No  . Sexually Active: Yes   Other Topics Concern  . Not on file   Social History Narrative  . No narrative on file    No past surgical history on file.  Family History  Problem Relation Age of Onset  . Hypertension Mother     Allergies  Allergen Reactions  . Penicillins     REACTION: Childhood reaction    Current Outpatient Prescriptions on File Prior to Visit  Medication Sig Dispense Refill  . calcium-vitamin D (OSCAL) 250-125 MG-UNIT per tablet Take 1 tablet by mouth daily.        . magnesium oxide (MAG-OX) 400 MG tablet Take 800 mg by mouth daily.        . Omega-3 350 MG CAPS Take 3 capsules by  mouth daily.          BP 124/76  Pulse 72  Temp 98 F (36.7 C)  Resp 14  Ht 5' 5.5" (1.664 m)  Wt 122 lb (55.339 kg)  BMI 19.99 kg/m2       Objective:   Physical Exam  Nursing note and vitals reviewed. Constitutional: She is oriented to person, place, and time. She appears well-developed and well-nourished. No distress.  HENT:  Head: Normocephalic and atraumatic.  Right Ear: External ear normal.  Left Ear: External ear normal.  Nose: Nose normal.  Mouth/Throat: Oropharynx is clear and moist.  Eyes: Conjunctivae and EOM are normal. Pupils are equal, round, and reactive to light.  Neck: Normal range of motion. Neck supple. No JVD present. No tracheal deviation present. No thyromegaly present.  Cardiovascular: Normal rate, regular rhythm, normal heart sounds and intact distal pulses.   No murmur heard. Pulmonary/Chest: Effort normal and breath sounds normal. She has no wheezes. She exhibits no tenderness.  Abdominal: Soft. Bowel sounds are normal.  Musculoskeletal: Normal range of motion. She exhibits no edema and no tenderness.  Lymphadenopathy:    She has no cervical adenopathy.  Neurological: She is alert and oriented to person, place, and time. She has normal reflexes. No cranial nerve deficit.  Skin: Skin is warm and dry. She is not diaphoretic.  Psychiatric: She has a normal mood and affect. Her behavior is normal.  Assessment & Plan:   This is a routine physical examination for this healthy  Female. Reviewed all health maintenance protocols including mammography colonoscopy bone density and reviewed appropriate screening labs. Her immunization history was reviewed as well as her current medications and allergies refills of her chronic medications were given and the plan for yearly health maintenance was discussed all orders and referrals were made as appropriate.

## 2011-04-22 LAB — VITAMIN D 25 HYDROXY (VIT D DEFICIENCY, FRACTURES): Vit D, 25-Hydroxy: 71 ng/mL (ref 30–89)

## 2011-04-22 LAB — C-REACTIVE PROTEIN: CRP: 0.05 mg/dL (ref <0.60)

## 2011-05-26 ENCOUNTER — Telehealth: Payer: Self-pay | Admitting: *Deleted

## 2011-05-26 NOTE — Progress Notes (Signed)
Pt informed and will come tomorrow for lfts repeated- copy of labs up front for pt

## 2011-05-26 NOTE — Telephone Encounter (Signed)
Pt informed

## 2011-05-27 ENCOUNTER — Other Ambulatory Visit (INDEPENDENT_AMBULATORY_CARE_PROVIDER_SITE_OTHER): Payer: PRIVATE HEALTH INSURANCE

## 2011-05-27 DIAGNOSIS — R945 Abnormal results of liver function studies: Secondary | ICD-10-CM

## 2011-05-27 LAB — HEPATIC FUNCTION PANEL
ALT: 51 U/L — ABNORMAL HIGH (ref 0–35)
AST: 38 U/L — ABNORMAL HIGH (ref 0–37)
Albumin: 3.8 g/dL (ref 3.5–5.2)
Alkaline Phosphatase: 94 U/L (ref 39–117)
Bilirubin, Direct: 0 mg/dL (ref 0.0–0.3)
Total Bilirubin: 0.5 mg/dL (ref 0.3–1.2)
Total Protein: 6.5 g/dL (ref 6.0–8.3)

## 2011-06-03 NOTE — Progress Notes (Signed)
Will recheck lft on 2-18 and get abd [place] if still elevated

## 2011-06-06 ENCOUNTER — Other Ambulatory Visit (INDEPENDENT_AMBULATORY_CARE_PROVIDER_SITE_OTHER): Payer: PRIVATE HEALTH INSURANCE

## 2011-06-06 ENCOUNTER — Telehealth: Payer: Self-pay | Admitting: Family Medicine

## 2011-06-06 DIAGNOSIS — R945 Abnormal results of liver function studies: Secondary | ICD-10-CM

## 2011-06-06 DIAGNOSIS — K769 Liver disease, unspecified: Secondary | ICD-10-CM

## 2011-06-06 LAB — HEPATIC FUNCTION PANEL
ALT: 29 U/L (ref 0–35)
AST: 27 U/L (ref 0–37)
Albumin: 4.1 g/dL (ref 3.5–5.2)
Alkaline Phosphatase: 87 U/L (ref 39–117)
Bilirubin, Direct: 0 mg/dL (ref 0.0–0.3)
Total Bilirubin: 0.3 mg/dL (ref 0.3–1.2)
Total Protein: 6.7 g/dL (ref 6.0–8.3)

## 2011-06-06 NOTE — Telephone Encounter (Signed)
Pt will come today for lab -she will be out of town next week

## 2011-06-06 NOTE — Telephone Encounter (Signed)
Patient wants to speak with you about her upcoming redraw on her liver functions. Wants to know if this is urgent, or can it wait til next month? Please call. Currently scheduled for this wk.

## 2011-10-31 ENCOUNTER — Ambulatory Visit (INDEPENDENT_AMBULATORY_CARE_PROVIDER_SITE_OTHER): Payer: PRIVATE HEALTH INSURANCE | Admitting: Internal Medicine

## 2011-10-31 ENCOUNTER — Encounter: Payer: Self-pay | Admitting: Internal Medicine

## 2011-10-31 VITALS — BP 110/70 | HR 64 | Temp 98.2°F | Resp 14 | Ht 65.5 in | Wt 122.0 lb

## 2011-10-31 DIAGNOSIS — R945 Abnormal results of liver function studies: Secondary | ICD-10-CM

## 2011-10-31 DIAGNOSIS — R252 Cramp and spasm: Secondary | ICD-10-CM

## 2011-10-31 DIAGNOSIS — K769 Liver disease, unspecified: Secondary | ICD-10-CM

## 2011-10-31 DIAGNOSIS — E785 Hyperlipidemia, unspecified: Secondary | ICD-10-CM

## 2011-10-31 LAB — LIPID PANEL
Cholesterol: 228 mg/dL — ABNORMAL HIGH (ref 0–200)
HDL: 82.3 mg/dL (ref >39.00)
Total CHOL/HDL Ratio: 3
Triglycerides: 69 mg/dL (ref 0.0–149.0)
VLDL: 13.8 mg/dL (ref 0.0–40.0)

## 2011-10-31 LAB — HEPATIC FUNCTION PANEL
ALT: 27 U/L (ref 0–35)
AST: 32 U/L (ref 0–37)
Albumin: 4.1 g/dL (ref 3.5–5.2)
Alkaline Phosphatase: 81 U/L (ref 39–117)
Bilirubin, Direct: 0 mg/dL (ref 0.0–0.3)
Total Bilirubin: 0.4 mg/dL (ref 0.3–1.2)
Total Protein: 6.6 g/dL (ref 6.0–8.3)

## 2011-10-31 LAB — LDL CHOLESTEROL, DIRECT: Direct LDL: 118.1 mg/dL

## 2011-10-31 LAB — MAGNESIUM: Magnesium: 2.3 mg/dL (ref 1.5–2.5)

## 2011-10-31 NOTE — Patient Instructions (Addendum)
The patient is instructed to continue all medications as prescribed. Schedule followup with check out clerk upon leaving the clinic  

## 2011-10-31 NOTE — Progress Notes (Signed)
  Subjective:    Patient ID: Jennifer [name], female    DOB: 26-Jan-1946, 66 y.o.   MRN: [id number]  HPI Follow up liver abnormanlities   Review of Systems  Constitutional: Negative for activity change, appetite change and fatigue.  HENT: Negative for ear pain, congestion, neck pain, postnasal drip and sinus pressure.   Eyes: Negative for redness and visual disturbance.  Respiratory: Negative for cough, shortness of breath and wheezing.   Gastrointestinal: Negative for abdominal pain and abdominal distention.  Genitourinary: Negative for dysuria, frequency and menstrual problem.  Musculoskeletal: Negative for myalgias, joint swelling and arthralgias.  Skin: Negative for rash and wound.  Neurological: Negative for dizziness, weakness and headaches.  Hematological: Negative for adenopathy. Does not bruise/bleed easily.  Psychiatric/Behavioral: Negative for disturbed wake/sleep cycle and decreased concentration.    The patient is instructed to continue all medications as prescribed. Schedule followup with check out clerk upon leaving the clinic     Objective:   Physical Exam  Nursing note and vitals reviewed. Constitutional: She is oriented to person, place, and time. She appears well-developed and well-nourished. No distress.  HENT:  Head: Normocephalic and atraumatic.  Right Ear: External ear normal.  Left Ear: External ear normal.  Nose: Nose normal.  Mouth/Throat: Oropharynx is clear and moist.  Eyes: Conjunctivae and EOM are normal. Pupils are equal, round, and reactive to light.  Neck: Normal range of motion. Neck supple. No JVD present. No tracheal deviation present. No thyromegaly present.  Cardiovascular: Normal rate, regular rhythm, normal heart sounds and intact distal pulses.   No murmur heard. Pulmonary/Chest: Effort normal and breath sounds normal. She has no wheezes. She exhibits no tenderness.  Abdominal: Soft. Bowel sounds are normal.  Musculoskeletal: Normal range of  motion. She exhibits no edema and no tenderness.  Lymphadenopathy:    She has no cervical adenopathy.  Neurological: She is alert and oriented to person, place, and time. She has normal reflexes. No cranial nerve deficit.  Skin: Skin is warm and dry. She is not diaphoretic.  Psychiatric: She has a normal mood and affect. Her behavior is normal.          Assessment & Plan:  monitoring diet Patient history of elevated liver functions it was most probably due to excessive fat and fatty liver.  She's monitoring for a lipid liver today her blood pressure stable we discussed diet as treatment for her chronic constipation hyperlipidemia and he then diet helping with her insomnia.  We discussed the diet as an ideal diet for her.   I have spent more than 30 minutes examining this patient face-to-face of which over half was spent in counseling

## 2012-04-24 ENCOUNTER — Other Ambulatory Visit (INDEPENDENT_AMBULATORY_CARE_PROVIDER_SITE_OTHER): Payer: Medicare Other

## 2012-04-24 DIAGNOSIS — R799 Abnormal finding of blood chemistry, unspecified: Secondary | ICD-10-CM

## 2012-04-24 DIAGNOSIS — Z0000 Encounter for general adult medical examination without abnormal findings: Secondary | ICD-10-CM

## 2012-04-24 DIAGNOSIS — E569 Vitamin deficiency, unspecified: Secondary | ICD-10-CM

## 2012-04-24 DIAGNOSIS — R7982 Elevated C-reactive protein (CRP): Secondary | ICD-10-CM

## 2012-04-24 DIAGNOSIS — D649 Anemia, unspecified: Secondary | ICD-10-CM

## 2012-04-24 DIAGNOSIS — E785 Hyperlipidemia, unspecified: Secondary | ICD-10-CM

## 2012-04-24 DIAGNOSIS — E559 Vitamin D deficiency, unspecified: Secondary | ICD-10-CM | POA: Diagnosis not present

## 2012-04-24 DIAGNOSIS — I10 Essential (primary) hypertension: Secondary | ICD-10-CM | POA: Diagnosis not present

## 2012-04-24 DIAGNOSIS — E039 Hypothyroidism, unspecified: Secondary | ICD-10-CM | POA: Diagnosis not present

## 2012-04-24 DIAGNOSIS — E46 Unspecified protein-calorie malnutrition: Secondary | ICD-10-CM

## 2012-04-24 LAB — BASIC METABOLIC PANEL
BUN: 16 mg/dL (ref 6–23)
CO2: 26 mEq/L (ref 19–32)
Calcium: 9.2 mg/dL (ref 8.4–10.5)
Chloride: 101 mEq/L (ref 96–112)
Creatinine, Ser: 0.8 mg/dL (ref 0.4–1.2)
GFR: 81.16 mL/min (ref >60.00)
Glucose, Bld: 83 mg/dL (ref 70–99)
Potassium: 4.2 mEq/L (ref 3.5–5.1)
Sodium: 134 mEq/L — ABNORMAL LOW (ref 135–145)

## 2012-04-24 LAB — POCT URINALYSIS DIPSTICK
Bilirubin, UA: NEGATIVE
Glucose, UA: NEGATIVE
Ketones, UA: NEGATIVE
Leukocytes, UA: NEGATIVE
Nitrite, UA: NEGATIVE
Protein, UA: NEGATIVE
Spec Grav, UA: 1.015
Urobilinogen, UA: 0.2
pH, UA: 7

## 2012-04-24 LAB — CBC WITH DIFFERENTIAL/PLATELET
Basophils Absolute: 0 10*3/uL (ref 0.0–0.1)
Basophils Relative: 0.5 % (ref 0.0–3.0)
Eosinophils Absolute: 0 10*3/uL (ref 0.0–0.7)
Eosinophils Relative: 1.7 % (ref 0.0–5.0)
HCT: 36.9 % (ref 36.0–46.0)
Hemoglobin: 12.7 g/dL (ref 12.0–15.0)
Lymphocytes Relative: 32.9 % (ref 12.0–46.0)
Lymphs Abs: 0.9 10*3/uL (ref 0.7–4.0)
MCHC: 34.5 g/dL (ref 30.0–36.0)
MCV: 91.4 fl (ref 78.0–100.0)
Monocytes Absolute: 0.4 10*3/uL (ref 0.1–1.0)
Monocytes Relative: 13.1 % — ABNORMAL HIGH (ref 3.0–12.0)
Neutro Abs: 1.5 10*3/uL (ref 1.4–7.7)
Neutrophils Relative %: 51.8 % (ref 43.0–77.0)
Platelets: 248 10*3/uL (ref 150.0–400.0)
RBC: 4.03 Mil/uL (ref 3.87–5.11)
RDW: 13.1 % (ref 11.5–14.6)
WBC: 2.8 10*3/uL — ABNORMAL LOW (ref 4.5–10.5)

## 2012-04-24 LAB — LDL CHOLESTEROL, DIRECT: Direct LDL: 128.7 mg/dL

## 2012-04-24 LAB — TSH: TSH: 1.42 u[IU]/mL (ref 0.35–5.50)

## 2012-04-24 LAB — LIPID PANEL
Cholesterol: 236 mg/dL — ABNORMAL HIGH (ref 0–200)
HDL: 75.2 mg/dL (ref >39.00)
Total CHOL/HDL Ratio: 3
Triglycerides: 55 mg/dL (ref 0.0–149.0)
VLDL: 11 mg/dL (ref 0.0–40.0)

## 2012-04-24 LAB — HEPATIC FUNCTION PANEL
ALT: 27 U/L (ref 0–35)
AST: 28 U/L (ref 0–37)
Albumin: 4 g/dL (ref 3.5–5.2)
Alkaline Phosphatase: 82 U/L (ref 39–117)
Bilirubin, Direct: 0 mg/dL (ref 0.0–0.3)
Total Bilirubin: 0.8 mg/dL (ref 0.3–1.2)
Total Protein: 6.4 g/dL (ref 6.0–8.3)

## 2012-04-25 LAB — C-REACTIVE PROTEIN: CRP: <0.5 mg/dL (ref <0.60)

## 2012-04-25 LAB — VITAMIN D 25 HYDROXY (VIT D DEFICIENCY, FRACTURES): Vit D, 25-Hydroxy: 49 ng/mL (ref 30–89)

## 2012-05-01 ENCOUNTER — Encounter: Payer: Self-pay | Admitting: Family

## 2012-05-01 ENCOUNTER — Ambulatory Visit (INDEPENDENT_AMBULATORY_CARE_PROVIDER_SITE_OTHER): Payer: Medicare Other | Admitting: Family

## 2012-05-01 ENCOUNTER — Encounter: Payer: PRIVATE HEALTH INSURANCE | Admitting: Internal Medicine

## 2012-05-01 VITALS — BP 108/74 | HR 51 | Ht 64.5 in | Wt 122.0 lb

## 2012-05-01 DIAGNOSIS — G4700 Insomnia, unspecified: Secondary | ICD-10-CM | POA: Diagnosis not present

## 2012-05-01 DIAGNOSIS — Z0000 Encounter for general adult medical examination without abnormal findings: Secondary | ICD-10-CM | POA: Diagnosis not present

## 2012-05-01 DIAGNOSIS — Z1231 Encounter for screening mammogram for malignant neoplasm of breast: Secondary | ICD-10-CM | POA: Diagnosis not present

## 2012-05-01 DIAGNOSIS — E7800 Pure hypercholesterolemia, unspecified: Secondary | ICD-10-CM | POA: Diagnosis not present

## 2012-05-01 NOTE — Progress Notes (Signed)
Subjective:    Patient ID: Jennifer [name], female    DOB: 1946-04-04, 67 y.o.   MRN: [id number]  HPI  This is a routine physical examination for this healthy  Female. Reviewed all health maintenance protocols including mammography colonoscopy bone density and reviewed appropriate screening labs. Her immunization history was reviewed as well as her current medications and allergies refills of her chronic medications were given and the plan for yearly health maintenance was discussed all orders and referrals were made as appropriate.  Patient had a complete hysterectomy.   Review of Systems  Constitutional: Negative.   HENT: Negative.   Eyes: Negative.   Respiratory: Negative.   Cardiovascular: Negative.   Gastrointestinal: Negative.   Genitourinary: Negative.   Musculoskeletal: Negative.   Skin: Negative.   Neurological: Negative.   Hematological: Negative.   Psychiatric/Behavioral: Negative.    No past medical history on file.  History   Social History  . Marital Status: Married    Spouse Name: N/A    Number of Children: N/A  . Years of Education: N/A   Occupational History  . Not on file.   Social History Main Topics  . Smoking status: Never Smoker   . Smokeless tobacco: Not on file  . Alcohol Use: Yes  . Drug Use: No  . Sexually Active: Yes   Other Topics Concern  . Not on file   Social History Narrative  . No narrative on file    No past surgical history on file.  Family History  Problem Relation Age of Onset  . Hypertension Mother     Allergies  Allergen Reactions  . Penicillins     REACTION: Childhood reaction    Current Outpatient Prescriptions on File Prior to Visit  Medication Sig Dispense Refill  . calcium-vitamin D (OSCAL) 250-125 MG-UNIT per tablet Take 1 tablet by mouth daily.        . magnesium oxide (MAG-OX) 400 MG tablet Take 800 mg by mouth daily.        . Omega-3 350 MG CAPS Take 3 capsules by mouth daily.        . zaleplon (SONATA)  10 MG capsule Take 1 capsule (10 mg total) by mouth at bedtime as needed. Needed for insomnia  30 capsule  0  . zolpidem (AMBIEN CR) 12.5 MG CR tablet Take 1 tablet (12.5 mg total) by mouth at bedtime as needed. When sonata fails  30 tablet  1    BP 108/74  Pulse 51  Ht 5' 4.5" (1.638 m)  Wt 122 lb (55.339 kg)  BMI 20.62 kg/m2  SpO2 98%chart    Objective:   Physical Exam  Constitutional: She is oriented to person, place, and time. She appears well-developed and well-nourished.  HENT:  Head: Normocephalic and atraumatic.  Right Ear: External ear normal.  Left Ear: External ear normal.  Nose: Nose normal.  Mouth/Throat: Oropharynx is clear and moist.  Eyes: Conjunctivae normal and EOM are normal. Pupils are equal, round, and reactive to light.  Neck: Normal range of motion. Neck supple. No thyromegaly present.  Cardiovascular: Normal rate, regular rhythm, normal heart sounds and intact distal pulses.  Exam reveals no gallop and no friction rub.   No murmur heard. Pulmonary/Chest: Effort normal and breath sounds normal.  Abdominal: Soft. Bowel sounds are normal.  Musculoskeletal: Normal range of motion.  Neurological: She is alert and oriented to person, place, and time. She has normal reflexes. No cranial nerve deficit. Coordination normal.  Skin: Skin  is warm and dry.  Psychiatric: She has a normal mood and affect.      EKG sinus bradycardia, otherwise normal rate of 48. Likely related to frequent of exercise.    Assessment & Plan:  Assessment: Welcome to Medicare CPX, Hypercholesterolemia  Plan: Patient declined influenza and Pneumovax. Encouraged monthly self breast exams. Continue daily exercise. Mammogram ordered. Recheck in 6 months and as needed sooner.

## 2012-05-01 NOTE — Patient Instructions (Addendum)

## 2012-05-24 ENCOUNTER — Ambulatory Visit
Admission: RE | Admit: 2012-05-24 | Discharge: 2012-05-24 | Disposition: A | Discharge status: Home / Self Care | Payer: Medicare Other | Source: Ambulatory Visit | Attending: Family | Admitting: Family

## 2012-05-24 DIAGNOSIS — Z1231 Encounter for screening mammogram for malignant neoplasm of breast: Secondary | ICD-10-CM | POA: Diagnosis not present

## 2012-06-02 ENCOUNTER — Other Ambulatory Visit: Payer: Self-pay

## 2012-06-07 ENCOUNTER — Other Ambulatory Visit: Payer: PRIVATE HEALTH INSURANCE | Admitting: Internal Medicine

## 2012-06-08 ENCOUNTER — Encounter: Payer: PRIVATE HEALTH INSURANCE | Admitting: Internal Medicine

## 2012-07-02 ENCOUNTER — Ambulatory Visit: Payer: PRIVATE HEALTH INSURANCE | Admitting: Internal Medicine

## 2012-09-07 ENCOUNTER — Other Ambulatory Visit: Payer: Self-pay | Admitting: Internal Medicine

## 2012-09-07 ENCOUNTER — Encounter: Payer: Self-pay | Admitting: Internal Medicine

## 2012-09-07 ENCOUNTER — Ambulatory Visit (INDEPENDENT_AMBULATORY_CARE_PROVIDER_SITE_OTHER): Payer: Medicare Other | Admitting: Internal Medicine

## 2012-09-07 VITALS — BP 100/60 | HR 72 | Temp 98.6°F | Resp 16 | Ht 64.5 in | Wt 120.0 lb

## 2012-09-07 DIAGNOSIS — E785 Hyperlipidemia, unspecified: Secondary | ICD-10-CM | POA: Diagnosis not present

## 2012-09-07 DIAGNOSIS — M949 Disorder of cartilage, unspecified: Secondary | ICD-10-CM

## 2012-09-07 DIAGNOSIS — Z23 Encounter for immunization: Secondary | ICD-10-CM

## 2012-09-07 DIAGNOSIS — M899 Disorder of bone, unspecified: Secondary | ICD-10-CM | POA: Diagnosis not present

## 2012-09-07 DIAGNOSIS — M8580 Other specified disorders of bone density and structure, unspecified site: Secondary | ICD-10-CM

## 2012-09-07 NOTE — Addendum Note (Signed)
Addended by: Willy Eddy on: 09/07/2012 05:06 PM   Modules accepted: Orders

## 2012-09-07 NOTE — Progress Notes (Signed)
  Subjective:    Patient ID: Jennifer [name], female    DOB: 04-Jun-1945, 67 y.o.   MRN: [id number]  HPI 2 presents to discuss screening for osteoporosis she has a known vitamin D deficiency and osteopenia on her last bone densitometry which was done 2011.  She is a pale blue ride white female with family history of osteoporosis.      Review of Systems  Constitutional: Negative for activity change, appetite change and fatigue.  HENT: Negative for ear pain, congestion, neck pain, postnasal drip and sinus pressure.   Eyes: Negative for redness and visual disturbance.  Respiratory: Negative for cough, shortness of breath and wheezing.   Gastrointestinal: Negative for abdominal pain and abdominal distention.  Genitourinary: Negative for dysuria, frequency and menstrual problem.  Musculoskeletal: Negative for myalgias, joint swelling and arthralgias.  Skin: Negative for rash and wound.  Neurological: Negative for dizziness, weakness and headaches.  Hematological: Negative for adenopathy. Does not bruise/bleed easily.  Psychiatric/Behavioral: Negative for sleep disturbance and decreased concentration.       Objective:   Physical Exam  Constitutional: She is oriented to person, place, and time. She appears well-developed and well-nourished. No distress.  HENT:  Head: Normocephalic and atraumatic.  Right Ear: External ear normal.  Left Ear: External ear normal.  Nose: Nose normal.  Mouth/Throat: Oropharynx is clear and moist.  Eyes: Conjunctivae and EOM are normal. Pupils are equal, round, and reactive to light.  Neck: Normal range of motion. Neck supple. No JVD present. No tracheal deviation present. No thyromegaly present.  Cardiovascular: Normal rate, regular rhythm, normal heart sounds and intact distal pulses.   No murmur heard. Pulmonary/Chest: Effort normal and breath sounds normal. She has no wheezes. She exhibits no tenderness.  Abdominal: Soft. Bowel sounds are normal.   Musculoskeletal: Normal range of motion. She exhibits no edema and no tenderness.  Lymphadenopathy:    She has no cervical adenopathy.  Neurological: She is alert and oriented to person, place, and time. She has normal reflexes. No cranial nerve deficit.  Skin: Skin is warm and dry. She is not diaphoretic.  Psychiatric: She has a normal mood and affect. Her behavior is normal.          Assessment & Plan:  Screening bone densitometry and a vitamin D level ordered today. Set up cpx

## 2012-12-24 ENCOUNTER — Other Ambulatory Visit: Payer: Self-pay | Admitting: Orthopedic Surgery

## 2012-12-24 DIAGNOSIS — M19019 Primary osteoarthritis, unspecified shoulder: Secondary | ICD-10-CM | POA: Diagnosis not present

## 2012-12-24 DIAGNOSIS — M25511 Pain in right shoulder: Secondary | ICD-10-CM

## 2012-12-27 DIAGNOSIS — M779 Enthesopathy, unspecified: Secondary | ICD-10-CM | POA: Diagnosis not present

## 2012-12-27 DIAGNOSIS — M2020 Hallux rigidus, unspecified foot: Secondary | ICD-10-CM | POA: Diagnosis not present

## 2012-12-28 ENCOUNTER — Ambulatory Visit
Admission: RE | Admit: 2012-12-28 | Discharge: 2012-12-28 | Disposition: A | Discharge status: Home / Self Care | Payer: Medicare Other | Source: Ambulatory Visit | Attending: Orthopedic Surgery | Admitting: Orthopedic Surgery

## 2012-12-28 DIAGNOSIS — M19019 Primary osteoarthritis, unspecified shoulder: Secondary | ICD-10-CM | POA: Diagnosis not present

## 2012-12-28 DIAGNOSIS — M25511 Pain in right shoulder: Secondary | ICD-10-CM

## 2012-12-28 DIAGNOSIS — R937 Abnormal findings on diagnostic imaging of other parts of musculoskeletal system: Secondary | ICD-10-CM | POA: Diagnosis not present

## 2012-12-28 DIAGNOSIS — Z01818 Encounter for other preprocedural examination: Secondary | ICD-10-CM | POA: Diagnosis not present

## 2013-01-22 ENCOUNTER — Telehealth: Payer: Self-pay | Admitting: *Deleted

## 2013-01-22 NOTE — Telephone Encounter (Signed)
"  Is the surgery you have proposed mostly costmetic or will I get reasonaably more flexibilityu in my left toe joint?  It is aabout )% now.  What % do lyou hope to gain?  I am quite active but don't want to do further damage."

## 2013-01-22 NOTE — Telephone Encounter (Signed)
Left message to call to discuss surgery questions of 01/21/13.

## 2013-01-22 NOTE — Telephone Encounter (Signed)
Pt asked if Dr Charlsie Merles did phone consultations, she forgot to ask - if surgery to remove a pin fixation to increase flexibility what percent is expected, and is she doing more damage if she continues to walk and exercise on it.  Pt states that she has no flexibility and has to change her gait to walk.  I offered pt an appt to discuss with you, she states in office recently and doesn't want to come in again to discuss.  I told pt if she was changing her gait or had pain she could be causing more problems.  Pt states would like Dr Charlsie Merles to call her I told her I would inform him of her request.

## 2013-02-04 ENCOUNTER — Telehealth: Payer: Self-pay | Admitting: *Deleted

## 2013-02-04 NOTE — Telephone Encounter (Signed)
Pt states planning office surgery with Dr Charlsie Merles 2nd week in January 2015, what dates are available?  I told pt 1/12, 14, 15/2015 in the morning.

## 2013-02-21 ENCOUNTER — Other Ambulatory Visit: Payer: Self-pay

## 2013-02-22 ENCOUNTER — Ambulatory Visit (INDEPENDENT_AMBULATORY_CARE_PROVIDER_SITE_OTHER): Payer: Medicare Other | Admitting: Family Medicine

## 2013-02-22 ENCOUNTER — Encounter: Payer: Self-pay | Admitting: Family Medicine

## 2013-02-22 ENCOUNTER — Telehealth: Payer: Self-pay | Admitting: *Deleted

## 2013-02-22 VITALS — BP 124/78 | HR 66 | Wt 125.0 lb

## 2013-02-22 DIAGNOSIS — M19019 Primary osteoarthritis, unspecified shoulder: Secondary | ICD-10-CM

## 2013-02-22 NOTE — Assessment & Plan Note (Signed)
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 I did discuss with patient about the benefits of surgery versus potential conservative therapy. Discuss that doing surgery while she is still very healthy mean she would probably do better in rehabilitation. Patient knows that it seems to be just a matter of time with her CT scan a disc become worse and will need the surgery. We discussed potential conservative therapy including intra-articular injections, formal physical therapy, and over-the-counter medications for show some benefit in osteo- arthritic pain. I discussed with patient that there is no wrong answer. Patient will make her decision to either move forward or to come back here for other evaluation if necessary. I believe she is leaning towards surgery.

## 2013-02-22 NOTE — Telephone Encounter (Signed)
Pt sent letter stating she needed a bone density.  Looks like dr Lovell Sheehan put order in in June of 2014 ,but was never scheduled-could you please schedule her a bone density/[phone]. She will be leaving nov. 19 , so she needs before then. Thanks--

## 2013-02-22 NOTE — Progress Notes (Signed)
CC: Right shoulder  HPI: Patient is a very pleasant 67 year old right-hand-dominant female coming in with right shoulder pain. Patient states she has had this pain for multiple years. Patient has seen multiple specialists and have told her she has severe arthritis. Patient has been going with Denice Paradise in physical therapy and also seen Dr. Dion Saucier for followup. Patient has been told that she needs a shoulder replacement surgery. Patient is actually scheduled to have surgery in December. Patient is here for further evaluation and recommendations. Patient states that she has pain that seems to be intermittent. Patient states that it is not all the time. Patient states when she does have the pain is more of a sharp pain that is followed by a chronic dull ache. Worse with overhead activity. Patient has lost the ability to turn her arm inward such as when she puts on her bra or reaching behind her. Patient denies any numbness or weakness of the hand. Patient also denies any neck pain. Patient states that she'll have pain at night but it does not seem to cause her to wake up. Patient states the only activity of daily living but she is unable to do this she would like to do would be to play tennis. Patient was the severity at 6/10.  Patient did have a CT scan in September of 2014. This was reviewed by me. 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  Past medical, surgical, family and social history reviewed. Medications reviewed all in the electronic medical record.   Review of Systems: No headache, visual changes, nausea, vomiting, diarrhea, constipation, dizziness, abdominal pain, skin rash, fevers, chills, night sweats, weight loss, swollen lymph nodes, body aches, joint swelling, muscle aches, chest pain, shortness of breath, mood changes.   Objective:    Blood pressure 124/78, pulse 66, weight 125 lb (56.7 kg), SpO2  99.00%.   General: No apparent distress alert and oriented x3 mood and affect normal, dressed appropriately.  HEENT: Pupils equal, extraocular movements intact Respiratory: Patient's speak in full sentences and does not appear short of breath Cardiovascular: No lower extremity edema, non tender, no erythema Skin: Warm dry intact with no signs of infection or rash on extremities or on axial skeleton. Abdomen: Soft nontender Neuro: Cranial nerves II through XII are intact, neurovascularly intact in all extremities with 2+ DTRs and 2+ pulses. Lymph: No lymphadenopathy of posterior or anterior cervical chain or axillae bilaterally.  Gait normal with good balance and coordination.  MSK: Non tender with full range of motion and good stability and symmetric strength and tone of elbows, wrist, hip, knee and ankles bilaterally.  Shoulder: Right Inspection reveals mild atrophy of the deltoids in the posterior shoulder musculature compared to the contralateral side Palpation is normal with no tenderness over AC joint or bicipital groove. Active range of motion shows patient has forward flexion to 100, 10 of external rotation internal rotation to anterior hip. Passive range of motion is minimally better than that. Patient does have crepitus on range of motion that is palpated. Positive signs of impingement Rotator cuff strength seems to be intact which is surprising Normal scapular function observed. Positive painful arc and no drop arm sign.   Spent greater than 45 minutes with patient face-to-face and had greater than 50% of counseling including as described above in assessment and plan.    Impression and Recommendations:     This case required medical decision making of moderate complexity.

## 2013-02-22 NOTE — Telephone Encounter (Signed)
Done

## 2013-02-22 NOTE — Patient Instructions (Signed)
Very nice to meet you There is no wrong answer.  Do what you feel is right.  Take tylenol 650 mg three times a day is the best evidence based medicine we have for arthritis.  Aleve 1-2 tabs twice a day with food or ibuprofen 600mg  twice daily with food can be added to tylenol.  Glucosamine sulfate 750mg  twice a day is a supplement that has been shown to help moderate to severe arthritis.  Or chondroitin on its own.   Vitamin D 2000IU daily Fish oil 3 grams daily.  Tumeric 500mg  twice daily.  Capsaicin topically up to four times a day may also help with pain. Cortisone injections are an option if these interventions do not seem to make a difference or need more relief.  ACP, or CAP trademark on natural substances.  Continue with Clifton Custard, he is great.  Come back if you need me for anything else.

## 2013-02-26 ENCOUNTER — Ambulatory Visit (INDEPENDENT_AMBULATORY_CARE_PROVIDER_SITE_OTHER)
Admission: RE | Admit: 2013-02-26 | Discharge: 2013-02-26 | Disposition: A | Discharge status: Home / Self Care | Payer: Medicare Other | Source: Ambulatory Visit | Attending: Internal Medicine | Admitting: Internal Medicine

## 2013-02-26 DIAGNOSIS — M899 Disorder of bone, unspecified: Secondary | ICD-10-CM

## 2013-02-26 DIAGNOSIS — M8580 Other specified disorders of bone density and structure, unspecified site: Secondary | ICD-10-CM

## 2013-03-04 ENCOUNTER — Encounter: Payer: Self-pay | Admitting: Podiatry

## 2013-03-04 ENCOUNTER — Ambulatory Visit (INDEPENDENT_AMBULATORY_CARE_PROVIDER_SITE_OTHER): Payer: Medicare Other | Admitting: Podiatry

## 2013-03-04 ENCOUNTER — Ambulatory Visit (INDEPENDENT_AMBULATORY_CARE_PROVIDER_SITE_OTHER): Payer: Medicare Other

## 2013-03-04 VITALS — BP 109/63 | HR 64 | Resp 16 | Ht 65.0 in | Wt 120.0 lb

## 2013-03-04 DIAGNOSIS — Z472 Encounter for removal of internal fixation device: Secondary | ICD-10-CM

## 2013-03-04 DIAGNOSIS — M2010 Hallux valgus (acquired), unspecified foot: Secondary | ICD-10-CM

## 2013-03-04 DIAGNOSIS — M79609 Pain in unspecified limb: Secondary | ICD-10-CM | POA: Diagnosis not present

## 2013-03-04 DIAGNOSIS — M79672 Pain in left foot: Secondary | ICD-10-CM

## 2013-03-04 NOTE — Progress Notes (Signed)
Subjective:     Patient ID: Jennifer [name], female   DOB: 1945/10/04, 67 y.o.   MRN: [id number]  HPI patient is found to have a prominent pin position left first metatarsal and spur formation around the medial side of the first metatarsal head with mild structural bunion deformity that is painful noted. Patient states it's becoming increasingly symptomatic   Review of Systems  All other systems reviewed and are negative.       Objective:   Physical Exam  Nursing note and vitals reviewed. Constitutional: She is oriented to person, place, and time.  Cardiovascular: Intact distal pulses.   Musculoskeletal: Normal range of motion.  Neurological: She is oriented to person, place, and time.  Skin: Skin is warm.   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 I have recommended a bunion procedure along with removal of pin and patient wants this done. I allowed the patient to read consent form Bylined explaining to her what would be required in order to correct deformity and she is comfortable with this understanding risk signs consent form understanding total recovery to take 6 months to one year scheduled for outpatient surgery

## 2013-03-12 ENCOUNTER — Encounter (HOSPITAL_COMMUNITY): Payer: Self-pay

## 2013-03-20 ENCOUNTER — Ambulatory Visit: Payer: Self-pay | Admitting: Podiatry

## 2013-03-20 ENCOUNTER — Encounter: Payer: Self-pay | Admitting: Podiatry

## 2013-03-20 DIAGNOSIS — Z472 Encounter for removal of internal fixation device: Secondary | ICD-10-CM

## 2013-03-20 DIAGNOSIS — M2010 Hallux valgus (acquired), unspecified foot: Secondary | ICD-10-CM

## 2013-03-20 DIAGNOSIS — T84498A Other mechanical complication of other internal orthopedic devices, implants and grafts, initial encounter: Secondary | ICD-10-CM | POA: Diagnosis not present

## 2013-03-20 DIAGNOSIS — Z01818 Encounter for other preprocedural examination: Secondary | ICD-10-CM | POA: Diagnosis not present

## 2013-03-21 ENCOUNTER — Ambulatory Visit: Payer: Self-pay | Admitting: Podiatry

## 2013-03-26 ENCOUNTER — Encounter (HOSPITAL_COMMUNITY): Admission: RE | Payer: Self-pay | Source: Ambulatory Visit

## 2013-03-26 ENCOUNTER — Inpatient Hospital Stay (HOSPITAL_COMMUNITY): Admission: RE | Admit: 2013-03-26 | Payer: Medicare Other | Source: Ambulatory Visit | Admitting: Orthopedic Surgery

## 2013-03-26 SURGERY — ARTHROPLASTY, SHOULDER, TOTAL
Anesthesia: General | Site: Shoulder | Laterality: Right

## 2013-03-27 ENCOUNTER — Ambulatory Visit (INDEPENDENT_AMBULATORY_CARE_PROVIDER_SITE_OTHER): Payer: Medicare Other

## 2013-03-27 ENCOUNTER — Ambulatory Visit (INDEPENDENT_AMBULATORY_CARE_PROVIDER_SITE_OTHER): Payer: Medicare Other | Admitting: Podiatry

## 2013-03-27 ENCOUNTER — Encounter: Payer: Self-pay | Admitting: Podiatry

## 2013-03-27 VITALS — BP 102/61 | HR 58 | Resp 16

## 2013-03-27 DIAGNOSIS — Z472 Encounter for removal of internal fixation device: Secondary | ICD-10-CM

## 2013-03-27 DIAGNOSIS — Z9889 Other specified postprocedural states: Secondary | ICD-10-CM

## 2013-03-27 DIAGNOSIS — M2020 Hallux rigidus, unspecified foot: Secondary | ICD-10-CM

## 2013-03-27 DIAGNOSIS — M2022 Hallux rigidus, left foot: Secondary | ICD-10-CM

## 2013-03-27 NOTE — Progress Notes (Signed)
Subjective:     Patient ID: Jennifer [name], female   DOB: 1946-02-20, 67 y.o.   MRN: [id number]  HPI patient states I'm doing really well with minimal discomfort after my surgery of one week ago   Review of Systems     Objective:   Physical Exam Neurovascular status intact with well-healed surgical site over the first metatarsal and shaft of the first metatarsal bone with wound edges coapted well and minimal edema noted    Assessment:     Patient is doing well post bunion surgery and removal of pin    Plan:     X-ray reviewed and at this time gradual return to saw shoe gear in the next 2 weeks with continued compression. Reappoint in 6 weeks and she will bring her orthotics at that time

## 2013-04-25 ENCOUNTER — Other Ambulatory Visit: Payer: Self-pay | Admitting: Internal Medicine

## 2013-04-25 DIAGNOSIS — Z1231 Encounter for screening mammogram for malignant neoplasm of breast: Secondary | ICD-10-CM

## 2013-04-29 NOTE — Progress Notes (Signed)
1) McBride bunionectomy left foot 2) Removal fixation left foot

## 2013-05-03 ENCOUNTER — Encounter: Payer: Medicare Other | Admitting: Internal Medicine

## 2013-05-06 ENCOUNTER — Ambulatory Visit: Payer: Medicare Other | Admitting: Podiatry

## 2013-05-24 ENCOUNTER — Ambulatory Visit (INDEPENDENT_AMBULATORY_CARE_PROVIDER_SITE_OTHER): Payer: Medicare Other | Admitting: Internal Medicine

## 2013-05-24 ENCOUNTER — Encounter: Payer: Self-pay | Admitting: Internal Medicine

## 2013-05-24 VITALS — BP 120/78 | HR 72 | Temp 98.0°F | Resp 16 | Ht 65.0 in | Wt 121.0 lb

## 2013-05-24 DIAGNOSIS — E785 Hyperlipidemia, unspecified: Secondary | ICD-10-CM | POA: Diagnosis not present

## 2013-05-24 DIAGNOSIS — M949 Disorder of cartilage, unspecified: Secondary | ICD-10-CM | POA: Diagnosis not present

## 2013-05-24 DIAGNOSIS — Z0000 Encounter for general adult medical examination without abnormal findings: Secondary | ICD-10-CM | POA: Diagnosis not present

## 2013-05-24 DIAGNOSIS — M899 Disorder of bone, unspecified: Secondary | ICD-10-CM

## 2013-05-24 DIAGNOSIS — M8580 Other specified disorders of bone density and structure, unspecified site: Secondary | ICD-10-CM

## 2013-05-24 DIAGNOSIS — T887XXA Unspecified adverse effect of drug or medicament, initial encounter: Secondary | ICD-10-CM

## 2013-05-24 LAB — CBC WITH DIFFERENTIAL/PLATELET
Basophils Absolute: 0 10*3/uL (ref 0.0–0.1)
Basophils Relative: 0.5 % (ref 0.0–3.0)
Eosinophils Absolute: 0 10*3/uL (ref 0.0–0.7)
Eosinophils Relative: 0.8 % (ref 0.0–5.0)
HCT: 38.1 % (ref 36.0–46.0)
Hemoglobin: 12.8 g/dL (ref 12.0–15.0)
Lymphocytes Relative: 25.8 % (ref 12.0–46.0)
Lymphs Abs: 1.5 10*3/uL (ref 0.7–4.0)
MCHC: 33.5 g/dL (ref 30.0–36.0)
MCV: 93.5 fl (ref 78.0–100.0)
Monocytes Absolute: 0.4 10*3/uL (ref 0.1–1.0)
Monocytes Relative: 7.8 % (ref 3.0–12.0)
Neutro Abs: 3.7 10*3/uL (ref 1.4–7.7)
Neutrophils Relative %: 65.1 % (ref 43.0–77.0)
Platelets: 271 10*3/uL (ref 150.0–400.0)
RBC: 4.07 Mil/uL (ref 3.87–5.11)
RDW: 13.3 % (ref 11.5–14.6)
WBC: 5.7 10*3/uL (ref 4.5–10.5)

## 2013-05-24 LAB — POCT URINALYSIS DIPSTICK
Bilirubin, UA: NEGATIVE
Blood, UA: NEGATIVE
Glucose, UA: NEGATIVE
Leukocytes, UA: NEGATIVE
Nitrite, UA: NEGATIVE
Protein, UA: NEGATIVE
Spec Grav, UA: 1.01
Urobilinogen, UA: 0.2
pH, UA: 7

## 2013-05-24 NOTE — Patient Instructions (Signed)
The patient is instructed to continue all medications as prescribed. Schedule followup with check out clerk upon leaving the clinic  

## 2013-05-24 NOTE — Progress Notes (Signed)
Subjective:    Patient ID: Jennifer [name], female    DOB: 1945/11/06, 68 y.o.   MRN: [id number]  HPI  Follow ed for sleep disorder  Review of Systems  Constitutional: Negative for activity change, appetite change and fatigue.  HENT: Negative for congestion, ear pain, postnasal drip and sinus pressure.   Eyes: Negative for redness and visual disturbance.  Respiratory: Negative for cough, shortness of breath and wheezing.   Gastrointestinal: Negative for abdominal pain and abdominal distention.  Genitourinary: Negative for dysuria, frequency and menstrual problem.  Musculoskeletal: Negative for arthralgias, joint swelling, myalgias and neck pain.  Skin: Positive for rash. Negative for wound.  Neurological: Negative for dizziness, weakness and headaches.  Hematological: Negative for adenopathy. Does not bruise/bleed easily.  Psychiatric/Behavioral: Negative for sleep disturbance and decreased concentration.       Objective:   Physical Exam  Constitutional: She is oriented to person, place, and time. She appears well-developed and well-nourished. No distress.  HENT:  Head: Normocephalic and atraumatic.  Right Ear: External ear normal.  Left Ear: External ear normal.  Nose: Nose normal.  Mouth/Throat: Oropharynx is clear and moist.  Eyes: Conjunctivae and EOM are normal. Pupils are equal, round, and reactive to light.  Neck: Normal range of motion. Neck supple. No JVD present. No tracheal deviation present. No thyromegaly present.  Cardiovascular: Normal rate, regular rhythm, normal heart sounds and intact distal pulses.   No murmur heard. Pulmonary/Chest: Effort normal and breath sounds normal. She has no wheezes. She exhibits no tenderness.  Abdominal: Soft. Bowel sounds are normal.  Musculoskeletal: Normal range of motion. She exhibits no edema and no tenderness.  Lymphadenopathy:    She has no cervical adenopathy.  Neurological: She is alert and oriented to person, place, and  time. She has normal reflexes. No cranial nerve deficit.  Skin: Skin is warm and dry. She is not diaphoretic.  Psychiatric: She has a normal mood and affect. Her behavior is normal.          Assessment & Plan:  Stable sleep disorder Medicare welness Subjective:    Jennifer [name] is a 68 y.o. female who presents for Medicare Annual/Subsequent preventive examination.  Preventive Screening-Counseling & Management  Tobacco History  Smoking status  . Never Smoker   Smokeless tobacco  . Not on file     Problems Prior to Visit 1.   Current Problems (verified) Patient Active Problem List   Diagnosis Date Noted  . Chronic tonsillar hypertrophy 06/16/2010  . OSTEOPENIA 05/07/2010  . DEGENERATIVE JOINT DISEASE, RIGHT SHOULDER 12/29/2009  . KNEE PAIN 12/29/2009  . ALLERGIC RHINITIS CAUSE UNSPECIFIED 09/04/2009  . CONSTIPATION, INTERMITTENT 09/04/2009  . MEMORY LOSS 09/04/2009  . INSOMNIA 04/25/2008  . HYPERLIPIDEMIA 12/11/2007  . SYMPTOMATIC MENOPAUSAL/FEMALE CLIMACTERIC STATES 12/11/2007  . 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 LIVER FUNCTION TESTS, ABNORMAL 04/06/2007    Medications Prior to Visit Current Outpatient Prescriptions on File Prior to Visit  Medication Sig Dispense Refill  . Ascorbic Acid (VITAMIN C) 100 MG tablet Take 100 mg by mouth daily.      Marland Kitchen b complex vitamins tablet Take 1 tablet by mouth daily.      . calcium-vitamin D (OSCAL) 250-125 MG-UNIT per tablet Take 1 tablet by mouth daily.        . magnesium oxide (MAG-OX) 400 MG tablet Take 800 mg by mouth daily.        Marland Kitchen omega-3 acid ethyl esters (LOVAZA) 1 G capsule Take 1 g by  mouth daily.      . zaleplon (SONATA) 10 MG capsule Take 1 capsule (10 mg total) by mouth at bedtime as needed. Needed for insomnia  30 capsule  0  . zolpidem (AMBIEN CR) 12.5 MG CR tablet Take 1 tablet (12.5 mg total) by mouth at bedtime as needed. When sonata fails  30 tablet  1   No current facility-administered medications  on file prior to visit.    Current Medications (verified) Current Outpatient Prescriptions  Medication Sig Dispense Refill  . Ascorbic Acid (VITAMIN C) 100 MG tablet Take 100 mg by mouth daily.      Marland Kitchen b complex vitamins tablet Take 1 tablet by mouth daily.      . calcium-vitamin D (OSCAL) 250-125 MG-UNIT per tablet Take 1 tablet by mouth daily.        . magnesium oxide (MAG-OX) 400 MG tablet Take 800 mg by mouth daily.        Marland Kitchen omega-3 acid ethyl esters (LOVAZA) 1 G capsule Take 1 g by mouth daily.      . zaleplon (SONATA) 10 MG capsule Take 1 capsule (10 mg total) by mouth at bedtime as needed. Needed for insomnia  30 capsule  0  . zolpidem (AMBIEN CR) 12.5 MG CR tablet Take 1 tablet (12.5 mg total) by mouth at bedtime as needed. When sonata fails  30 tablet  1   No current facility-administered medications for this visit.     Allergies (verified) Latex and Penicillins   PAST HISTORY  Family History Family History  Problem Relation Age of Onset  . Hypertension Mother     Social History History  Substance Use Topics  . Smoking status: Never Smoker   . Smokeless tobacco: Not on file  . Alcohol Use: Yes     Are there smokers in your home (other than you)? No  Risk Factors Current exercise habits: Gym/ health club routine includes cardio and light weights.  Dietary issues discussed: none   Cardiac risk factors: family history of premature cardiovascular disease.  Depression Screen (Note: if answer to either of the following is "Yes", a more complete depression screening is indicated)   Over the past two weeks, have you felt down, depressed or hopeless? No  Over the past two weeks, have you felt little interest or pleasure in doing things? No  Have you lost interest or pleasure in daily life? No  Do you often feel hopeless? No  Do you cry easily over simple problems? No  Activities of Daily Living In your present state of health, do you have any difficulty performing  the following activities?:  Driving? No Managing money?  No Feeding yourself? No Getting from bed to chair? No Climbing a flight of stairs? No Preparing food and eating?: No Bathing or showering? No Getting dressed: No Getting to the toilet? No Using the toilet:No Moving around from place to place: No In the past year have you fallen or had a near fall?:No   Are you sexually active?  Yes  Do you have more than one partner?  No  Hearing Difficulties: No Do you often ask people to speak up or repeat themselves? No Do you experience ringing or noises in your ears? No Do you have difficulty understanding soft or whispered voices? No   Do you feel that you have a problem with memory? No  Do you often misplace items? No  Do you feel safe at home?  yes  Cognitive Testing  Alert? yes  Normal Appearance?Yes  Oriented to person? Yes  Place? Yes   Time? Yes  Recall of three objects?  Yes  Can perform simple calculations? Yes  Displays appropriate judgment?Yes  Can read the correct time from a watch face?Yes   Advanced Directives have been discussed with the patient? Yes  List the Names of Other Physician/Practitioners you currently use: 1.    Indicate any recent Medical Services you may have received from other than [facility] providers in the past year (date may be approximate).  Immunization History  Administered Date(s) Administered  . Pneumococcal Polysaccharide-23 09/07/2012  . Td 03/05/2009  . Zoster 12/25/2012    Screening Tests Health Maintenance  Topic Date Due  . Influenza Vaccine  11/16/2013  . Mammogram  05/24/2014  . Colonoscopy  12/19/2017  . Tetanus/tdap  03/06/2019  . Pneumococcal Polysaccharide Vaccine Age 73 And Over  Completed  . Zostavax  Completed    All answers were reviewed with the patient and necessary referrals were made:  Georgetta Haber, MD   05/24/2013   History reviewed: allergies, current medications, past family history, past medical  history, past social history, past surgical history and problem list  Review of Systems Pertinent items are noted in HPI.    Objective:     Vision by Snellen chart: right eye:20/20, left eye:20/20  Body mass index is 20.14 kg/(m^2). BP 120/78  Pulse 72  Temp(Src) 98 F (36.7 C)  Resp 16  Ht 5\' 5"  (1.651 m)  Wt 121 lb (54.885 kg)  BMI 20.14 kg/m2  BP 120/78  Pulse 72  Temp(Src) 98 F (36.7 C)  Resp 16  Ht 5\' 5"  (1.651 m)  Wt 121 lb (54.885 kg)  BMI 20.14 kg/m2  General Appearance:    Alert, cooperative, no distress, appears stated age  Head:    Normocephalic, without obvious abnormality, atraumatic  Eyes:    PERRL, conjunctiva/corneas clear, EOM's intact, fundi    benign, both eyes  Ears:    Normal TM's and external ear canals, both ears  Nose:   Nares normal, septum midline, mucosa normal, no drainage    or sinus tenderness  Throat:   Lips, mucosa, and tongue normal; teeth and gums normal  Neck:   Supple, symmetrical, trachea midline, no adenopathy;    thyroid:  no enlargement/tenderness/nodules; no carotid   bruit or JVD  Back:     Symmetric, no curvature, ROM normal, no CVA tenderness  Lungs:     Clear to auscultation bilaterally, respirations unlabored  Chest Wall:    No tenderness or deformity   Heart:    Regular rate and rhythm, S1 and S2 normal, no murmur, rub   or gallop  Breast Exam:    No tenderness, masses, or nipple abnormality  Abdomen:     Soft, non-tender, bowel sounds active all four quadrants,    no masses, no organomegaly  Genitalia:    Normal female without lesion, discharge or tenderness  Rectal:    Normal tone, normal prostate, no masses or tenderness;   guaiac negative stool  Extremities:   Extremities normal, atraumatic, no cyanosis or edema  Pulses:   2+ and symmetric all extremities  Skin:   Skin color, texture, turgor normal, no rashes or lesions  Lymph nodes:   Cervical, supraclavicular, and axillary nodes normal  Neurologic:   CNII-XII  intact, normal strength, sensation and reflexes    throughout       Assessment:      This is  a routine physical examination for this healthy  Female. Reviewed all health maintenance protocols including mammography colonoscopy bone density and reviewed appropriate screening labs. Her immunization history was reviewed as well as her current medications and allergies refills of her chronic medications were given and the plan for yearly health maintenance was discussed all orders and referrals were made as appropriate.      Plan:     During the course of the visit the patient was educated and counseled about appropriate screening and preventive services including:    Influenza vaccine  Screening mammography  Diet review for nutrition referral? Yes ____  Not Indicated ____   Patient Instructions (the written plan) was given to the patient.  Medicare Attestation I have personally reviewed: The patient's medical and social history Their use of alcohol, tobacco or illicit drugs Their current medications and supplements The patient's functional ability including ADLs,fall risks, home safety risks, cognitive, and hearing and visual impairment Diet and physical activities Evidence for depression or mood disorders  The patient's weight, height, BMI, and visual acuity have been recorded in the chart.  I have made referrals, counseling, and provided education to the patient based on review of the above and I have provided the patient with a written personalized care plan for preventive services.     Georgetta Haber, MD   05/24/2013

## 2013-05-24 NOTE — Progress Notes (Signed)
Pre visit review using our clinic review tool, if applicable. No additional management support is needed unless otherwise documented below in the visit note. 

## 2013-05-25 LAB — VITAMIN D 25 HYDROXY (VIT D DEFICIENCY, FRACTURES): Vit D, 25-Hydroxy: 58 ng/mL (ref 30–89)

## 2013-05-27 ENCOUNTER — Ambulatory Visit (INDEPENDENT_AMBULATORY_CARE_PROVIDER_SITE_OTHER): Payer: Medicare Other | Admitting: Podiatry

## 2013-05-27 ENCOUNTER — Ambulatory Visit (INDEPENDENT_AMBULATORY_CARE_PROVIDER_SITE_OTHER): Payer: Medicare Other

## 2013-05-27 ENCOUNTER — Encounter: Payer: Self-pay | Admitting: Podiatry

## 2013-05-27 VITALS — BP 123/66 | HR 60 | Resp 12

## 2013-05-27 DIAGNOSIS — R52 Pain, unspecified: Secondary | ICD-10-CM | POA: Diagnosis not present

## 2013-05-27 DIAGNOSIS — M2020 Hallux rigidus, unspecified foot: Secondary | ICD-10-CM

## 2013-05-27 LAB — BASIC METABOLIC PANEL
BUN: 15 mg/dL (ref 6–23)
CO2: 24 mEq/L (ref 19–32)
Calcium: 9.6 mg/dL (ref 8.4–10.5)
Chloride: 103 mEq/L (ref 96–112)
Creatinine, Ser: 0.7 mg/dL (ref 0.4–1.2)
GFR: 91.96 mL/min (ref >60.00)
Glucose, Bld: 65 mg/dL — ABNORMAL LOW (ref 70–99)
Potassium: 4.9 mEq/L (ref 3.5–5.1)
Sodium: 137 mEq/L (ref 135–145)

## 2013-05-27 LAB — HEPATIC FUNCTION PANEL
ALT: 23 U/L (ref 0–35)
AST: 25 U/L (ref 0–37)
Albumin: 4.4 g/dL (ref 3.5–5.2)
Alkaline Phosphatase: 72 U/L (ref 39–117)
Bilirubin, Direct: 0.1 mg/dL (ref 0.0–0.3)
Total Bilirubin: 0.9 mg/dL (ref 0.3–1.2)
Total Protein: 6.7 g/dL (ref 6.0–8.3)

## 2013-05-27 LAB — LIPID PANEL
Cholesterol: 233 mg/dL — ABNORMAL HIGH (ref 0–200)
HDL: 88.1 mg/dL (ref >39.00)
Total CHOL/HDL Ratio: 3
Triglycerides: 54 mg/dL (ref 0.0–149.0)
VLDL: 10.8 mg/dL (ref 0.0–40.0)

## 2013-05-27 LAB — LDL CHOLESTEROL, DIRECT: Direct LDL: 131.6 mg/dL

## 2013-05-27 LAB — TSH: TSH: 0.94 u[IU]/mL (ref 0.35–5.50)

## 2013-05-27 NOTE — Progress Notes (Signed)
Subjective:     Patient ID: Jennifer [name], female   DOB: April 06, 1946, 68 y.o.   MRN: [id number]  HPI patient states that my left foot continues to improve and the range of motion is getting better with physical therapy provided by Dr. Velora Heckler   Review of Systems     Objective:   Physical Exam Neurovascular status intact with well-healed surgical site first metatarsal left with mild inflammation and reasonable range of motion with 20 of dorsi flexion 15 of plantarflexion    Assessment:     Improving first MPJ left with removal of pin and spur removal    Plan:     Allow patient to return to normal activity and we'll see back on an as-needed basis. Reviewed x-rays today

## 2013-05-29 ENCOUNTER — Ambulatory Visit (HOSPITAL_COMMUNITY)
Admission: RE | Admit: 2013-05-29 | Discharge: 2013-05-29 | Disposition: A | Discharge status: Home / Self Care | Payer: Medicare Other | Source: Ambulatory Visit | Attending: Internal Medicine | Admitting: Internal Medicine

## 2013-05-29 DIAGNOSIS — Z1231 Encounter for screening mammogram for malignant neoplasm of breast: Secondary | ICD-10-CM

## 2013-06-19 ENCOUNTER — Ambulatory Visit: Payer: BLUE CROSS/BLUE SHIELD | Admitting: Podiatry

## 2013-08-13 ENCOUNTER — Telehealth: Payer: Self-pay | Admitting: Internal Medicine

## 2013-08-13 NOTE — Telephone Encounter (Signed)
Pt would like to know if you could get her in to see Dr Mertha Finders at [place]. [address]  [phone] Pt states when she calls, she can't get anywhere. Pt feels like he's a lot like Dr Lenna Sciara, open to alternative meds. Pt states anything you can do will help.

## 2013-08-21 NOTE — Telephone Encounter (Signed)
Per Dr Arnoldo Morale he sent a request to Dr Lorin Mercy and he has not heard back from them

## 2013-08-22 NOTE — Telephone Encounter (Signed)
Left message for pt to call back  °

## 2013-08-22 NOTE — Telephone Encounter (Signed)
Pt returned your call and aware waiting on Dr Lorin Mercy returned call. Pt hopes we will fu and if we hear anything, please leave message concerning this on her phone. Pt will be on out out of town next week.

## 2013-09-17 DIAGNOSIS — L570 Actinic keratosis: Secondary | ICD-10-CM | POA: Diagnosis not present

## 2013-09-17 DIAGNOSIS — L819 Disorder of pigmentation, unspecified: Secondary | ICD-10-CM | POA: Diagnosis not present

## 2013-09-17 DIAGNOSIS — L821 Other seborrheic keratosis: Secondary | ICD-10-CM | POA: Diagnosis not present

## 2013-09-17 DIAGNOSIS — D239 Other benign neoplasm of skin, unspecified: Secondary | ICD-10-CM | POA: Diagnosis not present

## 2013-09-17 DIAGNOSIS — D1801 Hemangioma of skin and subcutaneous tissue: Secondary | ICD-10-CM | POA: Diagnosis not present

## 2013-10-17 NOTE — Telephone Encounter (Signed)
Pt states she contacted dr. Inda Merlin office and the receptionist stated that dr. Inda Merlin stated that he did not received anything from dr. Arnoldo Morale. Pt would like for dr. Arnoldo Morale to send the request in again if possible.  The doctors name is Dr. Inda Merlin not Lorin Mercy.

## 2013-11-23 DIAGNOSIS — H10029 Other mucopurulent conjunctivitis, unspecified eye: Secondary | ICD-10-CM | POA: Diagnosis not present

## 2013-11-28 DIAGNOSIS — J309 Allergic rhinitis, unspecified: Secondary | ICD-10-CM | POA: Diagnosis not present

## 2013-11-28 DIAGNOSIS — M25519 Pain in unspecified shoulder: Secondary | ICD-10-CM | POA: Diagnosis not present

## 2013-11-28 DIAGNOSIS — G479 Sleep disorder, unspecified: Secondary | ICD-10-CM | POA: Diagnosis not present

## 2013-12-03 DIAGNOSIS — H1030 Unspecified acute conjunctivitis, unspecified eye: Secondary | ICD-10-CM | POA: Diagnosis not present

## 2013-12-11 ENCOUNTER — Encounter: Payer: Self-pay | Admitting: Internal Medicine

## 2013-12-13 ENCOUNTER — Telehealth: Payer: Self-pay | Admitting: *Deleted

## 2013-12-13 NOTE — Telephone Encounter (Signed)
I'm a patient of Dr. Paulla Dolly.  About a week ago, I stubbed my little toe as we all do at night.  It's still really really sore!  I can move it around, I don't think it's broken but maybe lots of tendons broken, I don't know.  Is there anything one can do about it or are there some signals one ought to follow in case something is really more wrong with it?  I can walk on it okay it's just sore as it can be.  If you can leave me a message if you can't reach me I would be grateful.

## 2013-12-17 NOTE — Telephone Encounter (Signed)
I called and left her a message to call back and schedule an appointment with Dr. Paulla Dolly.

## 2013-12-26 ENCOUNTER — Encounter: Payer: Self-pay | Admitting: Podiatry

## 2013-12-26 ENCOUNTER — Ambulatory Visit (INDEPENDENT_AMBULATORY_CARE_PROVIDER_SITE_OTHER): Payer: Medicare Other | Admitting: Podiatry

## 2013-12-26 ENCOUNTER — Ambulatory Visit (INDEPENDENT_AMBULATORY_CARE_PROVIDER_SITE_OTHER): Payer: Medicare Other

## 2013-12-26 VITALS — BP 104/58 | HR 75 | Resp 16

## 2013-12-26 DIAGNOSIS — S99919A Unspecified injury of unspecified ankle, initial encounter: Secondary | ICD-10-CM | POA: Diagnosis not present

## 2013-12-26 DIAGNOSIS — S99929A Unspecified injury of unspecified foot, initial encounter: Secondary | ICD-10-CM

## 2013-12-26 DIAGNOSIS — S99922A Unspecified injury of left foot, initial encounter: Secondary | ICD-10-CM

## 2013-12-26 DIAGNOSIS — S8990XA Unspecified injury of unspecified lower leg, initial encounter: Secondary | ICD-10-CM

## 2013-12-26 DIAGNOSIS — L03039 Cellulitis of unspecified toe: Secondary | ICD-10-CM

## 2013-12-26 NOTE — Progress Notes (Signed)
Subjective:     Patient ID: Jennifer [name], female   DOB: 1946-03-21, 68 y.o.   MRN: [id number]  HPI patient states that I traumatized the fifth toe on my left foot and it became red and irritated and I'm going out of town and I was concerned about   Review of Systems     Objective:   Physical Exam Neurovascular status unchanged with red irritated interphalangeal joint left fifth toe and irritation of the left fifth nail a secondary problem from trauma    Assessment:     Possible fracture left fifth toe with paronychia infection left fifth nail    Plan:     H&P and x-rays reviewed. Discussed different treatment options and also that we may need this nail and explained that to the patient. She will be seen back as needed if the nail or the toe continues to give her symptoms

## 2014-01-09 DIAGNOSIS — H2510 Age-related nuclear cataract, unspecified eye: Secondary | ICD-10-CM | POA: Diagnosis not present

## 2014-06-11 DIAGNOSIS — E559 Vitamin D deficiency, unspecified: Secondary | ICD-10-CM | POA: Diagnosis not present

## 2014-06-11 DIAGNOSIS — Z0001 Encounter for general adult medical examination with abnormal findings: Secondary | ICD-10-CM | POA: Diagnosis not present

## 2014-06-11 DIAGNOSIS — Z23 Encounter for immunization: Secondary | ICD-10-CM | POA: Diagnosis not present

## 2014-06-11 DIAGNOSIS — E780 Pure hypercholesterolemia: Secondary | ICD-10-CM | POA: Diagnosis not present

## 2014-06-11 DIAGNOSIS — J309 Allergic rhinitis, unspecified: Secondary | ICD-10-CM | POA: Diagnosis not present

## 2014-06-11 DIAGNOSIS — M25519 Pain in unspecified shoulder: Secondary | ICD-10-CM | POA: Diagnosis not present

## 2014-06-11 DIAGNOSIS — G479 Sleep disorder, unspecified: Secondary | ICD-10-CM | POA: Diagnosis not present

## 2014-07-28 DIAGNOSIS — H8110 Benign paroxysmal vertigo, unspecified ear: Secondary | ICD-10-CM | POA: Diagnosis not present

## 2014-10-16 DIAGNOSIS — D225 Melanocytic nevi of trunk: Secondary | ICD-10-CM | POA: Diagnosis not present

## 2014-10-16 DIAGNOSIS — L570 Actinic keratosis: Secondary | ICD-10-CM | POA: Diagnosis not present

## 2014-10-16 DIAGNOSIS — L814 Other melanin hyperpigmentation: Secondary | ICD-10-CM | POA: Diagnosis not present

## 2014-10-16 DIAGNOSIS — D2272 Melanocytic nevi of left lower limb, including hip: Secondary | ICD-10-CM | POA: Diagnosis not present

## 2014-10-16 DIAGNOSIS — L821 Other seborrheic keratosis: Secondary | ICD-10-CM | POA: Diagnosis not present

## 2015-02-05 ENCOUNTER — Other Ambulatory Visit: Payer: Self-pay

## 2015-02-05 DIAGNOSIS — Z1231 Encounter for screening mammogram for malignant neoplasm of breast: Secondary | ICD-10-CM

## 2015-03-04 ENCOUNTER — Ambulatory Visit
Admission: RE | Admit: 2015-03-04 | Discharge: 2015-03-04 | Disposition: A | Discharge status: Home / Self Care | Payer: Medicare Other | Source: Ambulatory Visit

## 2015-03-04 DIAGNOSIS — Z1231 Encounter for screening mammogram for malignant neoplasm of breast: Secondary | ICD-10-CM | POA: Diagnosis not present

## 2015-06-30 DIAGNOSIS — H8110 Benign paroxysmal vertigo, unspecified ear: Secondary | ICD-10-CM | POA: Diagnosis not present

## 2015-06-30 DIAGNOSIS — Z1389 Encounter for screening for other disorder: Secondary | ICD-10-CM | POA: Diagnosis not present

## 2015-06-30 DIAGNOSIS — R5383 Other fatigue: Secondary | ICD-10-CM | POA: Diagnosis not present

## 2015-06-30 DIAGNOSIS — R0982 Postnasal drip: Secondary | ICD-10-CM | POA: Diagnosis not present

## 2015-06-30 DIAGNOSIS — E559 Vitamin D deficiency, unspecified: Secondary | ICD-10-CM | POA: Diagnosis not present

## 2015-06-30 DIAGNOSIS — Z0001 Encounter for general adult medical examination with abnormal findings: Secondary | ICD-10-CM | POA: Diagnosis not present

## 2015-06-30 DIAGNOSIS — G479 Sleep disorder, unspecified: Secondary | ICD-10-CM | POA: Diagnosis not present

## 2015-06-30 DIAGNOSIS — J309 Allergic rhinitis, unspecified: Secondary | ICD-10-CM | POA: Diagnosis not present

## 2015-06-30 DIAGNOSIS — M25519 Pain in unspecified shoulder: Secondary | ICD-10-CM | POA: Diagnosis not present

## 2015-08-04 DIAGNOSIS — Z1212 Encounter for screening for malignant neoplasm of rectum: Secondary | ICD-10-CM | POA: Diagnosis not present

## 2015-08-04 DIAGNOSIS — Z1211 Encounter for screening for malignant neoplasm of colon: Secondary | ICD-10-CM | POA: Diagnosis not present

## 2015-08-04 LAB — COLOGUARD: Cologuard: POSITIVE

## 2015-08-20 ENCOUNTER — Telehealth: Payer: Self-pay | Admitting: Internal Medicine

## 2015-08-24 ENCOUNTER — Encounter: Payer: Self-pay | Admitting: Internal Medicine

## 2015-08-24 NOTE — Telephone Encounter (Signed)
LM ON VMAIL TO CALL  BACK AND SCHEDULE AN APPT

## 2015-08-24 NOTE — Telephone Encounter (Signed)
ok 

## 2015-10-08 ENCOUNTER — Encounter: Payer: Self-pay | Admitting: *Deleted

## 2015-10-29 ENCOUNTER — Encounter: Payer: Self-pay | Admitting: Internal Medicine

## 2015-10-29 ENCOUNTER — Ambulatory Visit (INDEPENDENT_AMBULATORY_CARE_PROVIDER_SITE_OTHER): Payer: Medicare Other | Admitting: Internal Medicine

## 2015-10-29 VITALS — BP 98/60 | HR 68 | Ht 64.75 in | Wt 123.5 lb

## 2015-10-29 DIAGNOSIS — R195 Other fecal abnormalities: Secondary | ICD-10-CM | POA: Diagnosis not present

## 2015-10-29 MED ORDER — NA SULFATE-K SULFATE-MG SULF 17.5-3.13-1.6 GM/177ML PO SOLN: ORAL | Status: DC

## 2015-10-29 NOTE — Progress Notes (Signed)
Patient ID: Jennifer [name], female   DOB: July 17, 1945, 70 y.o.   MRN: [id number] HPI: Akemi Burlew is a 70 year old female with a past medical history of vitamin D deficiency and hyperlipidemia who seen in consultation at the request of Dr. Inda Merlin to discuss a positive Cologuard test. She is here alone today. She's had prior screening colonoscopy with Dr. Olevia Perches. Her last colonoscopy was in 2009. This study was normal, to the cecum, with good preparation. She states that overall she has been feeling well. She does occasionally have mild constipation which seems to be worse with traveling. At times she takes an over-the-counter natural herbal laxative which she states is mild and helpful. She does endorse a problem with hemorrhoids which is long-standing and worsened by her constipation. She intermittently has red blood with stools. She denies melena. Denies abdominal pain. Denies bloating. Denies upper GI complaint including no heartburn, dysphagia or odynophagia. She denies a family history of colorectal cancer or inflammatory bowel disease. She has had prior hysterectomy and oophorectomy in 2007 approximately. She is married, lives in [place]. Does not use tobacco. She does drink alcohol approximately 3 days per week. She takes very limited medications mostly which are vitamin supplements.  Past Medical History  Diagnosis Date  . Insomnia   . Hyperlipidemia   . Vitamin D deficiency   . DJD of shoulder   . Arthritis     Past Surgical History  Procedure Laterality Date  . Foot surgery Left   . Hiatal hernia repair  1954    age 69  . Shoulder arthroscopy Right   . Total abdominal hysterectomy w/ bilateral salpingoophorectomy  2007  . Foot surgery Right   . Oophorectomy      Outpatient Prescriptions Prior to Visit  Medication Sig Dispense Refill  . Ascorbic Acid (VITAMIN C) 100 MG tablet Take 100 mg by mouth 2 (two) times a week.     Marland Kitchen b complex vitamins tablet Take 1 tablet by mouth 2 (two) times  a week.     . magnesium oxide (MAG-OX) 400 MG tablet Take 800 mg by mouth daily.      Marland Kitchen omega-3 acid ethyl esters (LOVAZA) 1 G capsule Take 1 g by mouth 2 (two) times a week.     . calcium-vitamin D (OSCAL) 250-125 MG-UNIT per tablet Take 1 tablet by mouth daily.      . zaleplon (SONATA) 10 MG capsule Take 1 capsule (10 mg total) by mouth at bedtime as needed. Needed for insomnia 30 capsule 0  . zolpidem (AMBIEN CR) 12.5 MG CR tablet Take 1 tablet (12.5 mg total) by mouth at bedtime as needed. When sonata fails 30 tablet 1   No facility-administered medications prior to visit.    Allergies  Allergen Reactions  . Latex Other (See Comments)    Patient preference   . Penicillins     REACTION: Childhood reaction    Family History  Problem Relation Age of Onset  . Hypertension Mother   . Breast cancer Mother   . Heart disease Mother   . Heart attack Father 63    deceased  . Prostate cancer Maternal Uncle     Social History  Substance Use Topics  . Smoking status: Never Smoker   . Smokeless tobacco: Never Used  . Alcohol Use: 0.0 oz/week    0 Standard drinks or equivalent per week     Comment: 3 per week    ROS: As per history of present illness, otherwise  negative  BP 98/60 mmHg  Pulse 68  Ht 5' 4.75" (1.645 m)  Wt 123 lb 8 oz (56.019 kg)  BMI 20.70 kg/m2 Constitutional: Well-developed and well-nourished. No distress. HEENT: Normocephalic and atraumatic. Oropharynx is clear and moist. No oropharyngeal exudate. Conjunctivae are normal.  No scleral icterus. Neck: Neck supple. Trachea midline. Cardiovascular: Normal rate, regular rhythm and intact distal pulses. No M/R/G Pulmonary/chest: Effort normal and breath sounds normal. No wheezing, rales or rhonchi. Abdominal: Soft, nontender, nondistended. Bowel sounds active throughout. There are no masses palpable. No hepatosplenomegaly. Extremities: no clubbing, cyanosis, or edema Lymphadenopathy: No cervical adenopathy  noted. Neurological: Alert and oriented to person place and time. Skin: Skin is warm and dry. No rashes noted. Psychiatric: Normal mood and affect. Behavior is normal.  RELEVANT LABS AND IMAGING: Labs from primary care reviewed from March 2017. Hemoglobin normal at 13.2. MCV 92.5. Comp rancid metabolic panel within normal limits. TSH normal. Vitamin D normal.  ASSESSMENT/PLAN: 70 year old female with a past medical history of vitamin D deficiency and hyperlipidemia who seen in consultation at the request of Dr. Inda Merlin to discuss a positive Cologuard test.  1. Positive Cologuard -- I have discussed the positive Cologuard result with her at length. I recommended colonoscopy and after a discussion of the risks, benefits and alternative she wishes to proceed. She understands that this will be a diagnostic examination after the positive Cologuard. This test will be scheduled for her.      [id number], [address], [place] [address]

## 2015-10-29 NOTE — Patient Instructions (Addendum)
You have been scheduled for a colonoscopy. Please follow written instructions given to you at your visit today.  Please pick up your prep supplies at the pharmacy within the next 1-3 days. If you use inhalers (even only as needed), please bring them with you on the day of your procedure. Your physician has requested that you go to www.startemmi.com and enter the access code given to you at your visit today. This web site gives a general overview about your procedure. However, you should still follow specific instructions given to you by our office regarding your preparation for the procedure.  If you are age 69 or older, your body mass index should be between 23-30. Your Body mass index is 20.7 kg/(m^2). If this is out of the aforementioned range listed, please consider follow up with your Primary Care Provider.  If you are age 68 or younger, your body mass index should be between 19-25. Your Body mass index is 20.7 kg/(m^2). If this is out of the aformentioned range listed, please consider follow up with your Primary Care Provider.

## 2015-11-03 ENCOUNTER — Encounter: Payer: Self-pay | Admitting: Internal Medicine

## 2015-11-03 ENCOUNTER — Ambulatory Visit (AMBULATORY_SURGERY_CENTER): Payer: Medicare Other | Admitting: Internal Medicine

## 2015-11-03 VITALS — BP 126/68 | HR 56 | Temp 98.0°F | Resp 20 | Ht 64.0 in | Wt 123.0 lb

## 2015-11-03 DIAGNOSIS — K621 Rectal polyp: Secondary | ICD-10-CM | POA: Diagnosis not present

## 2015-11-03 DIAGNOSIS — D120 Benign neoplasm of cecum: Secondary | ICD-10-CM

## 2015-11-03 DIAGNOSIS — R195 Other fecal abnormalities: Secondary | ICD-10-CM

## 2015-11-03 DIAGNOSIS — K626 Ulcer of anus and rectum: Secondary | ICD-10-CM | POA: Diagnosis not present

## 2015-11-03 DIAGNOSIS — D128 Benign neoplasm of rectum: Secondary | ICD-10-CM

## 2015-11-03 MED ORDER — SODIUM CHLORIDE 0.9 % IV SOLN: 500.0000 mL | INTRAVENOUS | Status: DC

## 2015-11-03 NOTE — Progress Notes (Signed)
Called to room to assist during endoscopic procedure.  Patient ID and intended procedure confirmed with present staff. Received instructions for my participation in the procedure from the performing physician.  

## 2015-11-03 NOTE — Patient Instructions (Addendum)
YOU HAD AN ENDOSCOPIC PROCEDURE TODAY AT THE [facility]:   Refer to the procedure report that was given to you for any specific questions about what was found during the examination.  If the procedure report does not answer your questions, please call your gastroenterologist to clarify.  If you requested that your care partner not be given the details of your procedure findings, then the procedure report has been included in a sealed envelope for you to review at your convenience later.  YOU SHOULD EXPECT: Some feelings of bloating in the abdomen. Passage of more gas than usual.  Walking can help get rid of the air that was put into your GI tract during the procedure and reduce the bloating. If you had a lower endoscopy (such as a colonoscopy or flexible sigmoidoscopy) you may notice spotting of blood in your stool or on the toilet paper. If you underwent a bowel prep for your procedure, you may not have a normal bowel movement for a few days.  Please Note:  You might notice some irritation and congestion in your nose or some drainage.  This is from the oxygen used during your procedure.  There is no need for concern and it should clear up in a day or so.  SYMPTOMS TO REPORT IMMEDIATELY:   Following lower endoscopy (colonoscopy or flexible sigmoidoscopy):  Excessive amounts of blood in the stool  Significant tenderness or worsening of abdominal pains  Swelling of the abdomen that is new, acute  Fever of 100F or higher   For urgent or emergent issues, a gastroenterologist can be reached at any hour by calling [phone].   DIET: Your first meal following the procedure should be a small meal and then it is ok to progress to your normal diet. Heavy or fried foods are harder to digest and may make you feel nauseous or bloated.  Likewise, meals heavy in dairy and vegetables can increase bloating.  Drink plenty of fluids but you should avoid alcoholic beverages for 24 hours. Try to  increase the fiber in your diet, and drink plenty of water.  ACTIVITY:  You should plan to take it easy for the rest of today and you should NOT DRIVE or use heavy machinery until tomorrow (because of the sedation medicines used during the test).    FOLLOW UP: Our staff will call the number listed on your records the next business day following your procedure to check on you and address any questions or concerns that you may have regarding the information given to you following your procedure. If we do not reach you, we will leave a message.  However, if you are feeling well and you are not experiencing any problems, there is no need to return our call.  We will assume that you have returned to your regular daily activities without incident.  If any biopsies were taken you will be contacted by phone or by letter within the next 1-3 weeks.  Please call us at [phone] if you have not heard about the biopsies in 3 weeks.    SIGNATURES/CONFIDENTIALITY: You and/or your care partner have signed paperwork which will be entered into your electronic medical record.  These signatures attest to the fact that that the information above on your After Visit Summary has been reviewed and is understood.  Full responsibility of the confidentiality of this discharge information lies with you and/or your care-partner.  Read all of the handouts given to you by your recovery   room nurse.  Thank-you for choosing us for your healthcare needs today. 

## 2015-11-03 NOTE — Progress Notes (Signed)
Apparently, the husband wandered into the nurse manager's office and was quite angry before he came into the recovery room. I do not have the whole story as to what happened.  He was quite curt with me as I introduced myself to him.  Discharge instructions are pending at this time. They will be discussed shortly.

## 2015-11-03 NOTE — Progress Notes (Signed)
Report to PACU, RN, vss, BBS= Clear.  

## 2015-11-03 NOTE — Op Note (Signed)
[facility] Patient Name: Jennifer [name] Procedure Date: 11/03/2015 3:33 PM MRN: [id number] Endoscopist: Jerene Bears , MD Age: 70 Referring MD:  Date of Birth: May 20, 1945 Gender: Female Account #: [id number] Procedure:                Colonoscopy Indications:              positive Cologuard Medicines:                Monitored Anesthesia Care Procedure:                Pre-Anesthesia Assessment:                           - Prior to the procedure, a History and Physical                            was performed, and patient medications and                            allergies were reviewed. The patient's tolerance of                            previous anesthesia was also reviewed. The risks                            and benefits of the procedure and the sedation                            options and risks were discussed with the patient.                            All questions were answered, and informed consent                            was obtained. Prior Anticoagulants: The patient has                            taken no previous anticoagulant or antiplatelet                            agents. ASA Grade Assessment: II - A patient with                            mild systemic disease. After reviewing the risks                            and benefits, the patient was deemed in                            satisfactory condition to undergo the procedure.                           After obtaining informed consent, the colonoscope  was passed under direct vision. Throughout the                            procedure, the patient's blood pressure, pulse, and                            oxygen saturations were monitored continuously. The                            Model PCF-H190L [id number]) scope was introduced                            through the anus and advanced to the the cecum,                            identified by appendiceal orifice and ileocecal                             valve. The colonoscopy was performed without                            difficulty. The patient tolerated the procedure                            well. The quality of the bowel preparation was                            excellent. The ileocecal valve, appendiceal                            orifice, and rectum were photographed. Scope In: 3:44:05 PM Scope Out: 4:13:17 PM Scope Withdrawal Time: 0 hours 18 minutes 50 seconds  Total Procedure Duration: 0 hours 29 minutes 12 seconds  Findings:                 The digital rectal exam was normal.                           A 5 mm polyp was found in the ileocecal valve. The                            polyp was sessile. The polyp was removed with a                            cold snare. Resection and retrieval were complete.                           An approximately 6-8 mm polyp versus prolapse                            change was found in the distal rectum. The lesion                            was sessile and approximates  the dentate line. Four                            biopsies were obtained with cold forceps for                            histology in a targeted manner at this possible                            lesion.                           Internal hemorrhoids were found during                            retroflexion. The hemorrhoids were small.                           The exam was otherwise without abnormality. Complications:            No immediate complications. Estimated Blood Loss:     Estimated blood loss was minimal. Impression:               - One 5 mm polyp at the ileocecal valve, removed                            with a cold snare. Resected and retrieved.                           - Possible polypoid lesion versus prolapse change                            in the distal rectum. Biopsied.                           - Internal hemorrhoids.                           - The examination was otherwise  normal. Recommendation:           - Patient has a contact number available for                            emergencies. The signs and symptoms of potential                            delayed complications were discussed with the                            patient. Return to normal activities tomorrow.                            Written discharge instructions were provided to the                            patient.                           -  Resume previous diet.                           - Continue present medications.                           - Await pathology results.                           - Repeat colonoscopy is recommended. The                            colonoscopy date will be determined after pathology                            results from today's exam become available for                            review. Jerene Bears, MD 11/03/2015 4:20:52 PM This report has been signed electronically.

## 2015-11-04 ENCOUNTER — Telehealth: Payer: Self-pay

## 2015-11-04 ENCOUNTER — Telehealth: Payer: Self-pay | Admitting: *Deleted

## 2015-11-04 NOTE — Telephone Encounter (Signed)
  Follow up Call-  Call back number 11/03/2015  Post procedure Call Back phone  # [phone]  Permission to leave phone message Yes    Jennifer [name] called Jennifer [name] this morning to tell her that she left her book in the [facility]. Jennifer [name] reports that she is doing well and has returned to her normal daily activities without any difficulty.

## 2015-12-17 ENCOUNTER — Encounter: Payer: Self-pay | Admitting: *Deleted

## 2015-12-18 NOTE — Telephone Encounter (Signed)
No answer left message to call if questions or concerns. 

## 2016-01-04 ENCOUNTER — Encounter: Payer: Self-pay | Admitting: Internal Medicine

## 2016-01-04 ENCOUNTER — Ambulatory Visit (INDEPENDENT_AMBULATORY_CARE_PROVIDER_SITE_OTHER): Payer: Medicare Other | Admitting: Internal Medicine

## 2016-01-04 VITALS — BP 120/70 | HR 60 | Ht 64.0 in | Wt 122.0 lb

## 2016-01-04 DIAGNOSIS — K648 Other hemorrhoids: Secondary | ICD-10-CM

## 2016-01-04 NOTE — Patient Instructions (Addendum)
You have been scheduled for your 2nd hemorrhoidal banding on Tuesday, February 09, 2016 at 3:30 pm.  Please purchase the following medications over the counter and take as directed: Miralax 17 grams dissolved in at least 8 ounces water/juice daily  Discontinue herbal supplements.  If you are age 69 or older, your body mass index should be between 23-30. Your Body mass index is 20.94 kg/m. If this is out of the aforementioned range listed, please consider follow up with your Primary Care Provider.  If you are age 42 or younger, your body mass index should be between 19-25. Your Body mass index is 20.94 kg/m. If this is out of the aformentioned range listed, please consider follow up with your Primary Care Provider.   HEMORRHOID BANDING PROCEDURE    FOLLOW-UP CARE   1. The procedure you have had should have been relatively painless since the banding of the area involved does not have nerve endings and there is no pain sensation.  The rubber band cuts off the blood supply to the hemorrhoid and the band may fall off as soon as 48 hours after the banding (the band may occasionally be seen in the toilet bowl following a bowel movement). You may notice a temporary feeling of fullness in the rectum which should respond adequately to plain Tylenol or Motrin.  2. Following the banding, avoid strenuous exercise that evening and resume full activity the next day.  A sitz bath (soaking in a warm tub) or bidet is soothing, and can be useful for cleansing the area after bowel movements.     3. To avoid constipation, take two tablespoons of natural wheat bran, natural oat bran, flax, Benefiber or any over the counter fiber supplement and increase your water intake to 7-8 glasses daily.    4. Unless you have been prescribed anorectal medication, do not put anything inside your rectum for two weeks: No suppositories, enemas, fingers, etc.  5. Occasionally, you may have more bleeding than usual after the  banding procedure.  This is often from the untreated hemorrhoids rather than the treated one.  Don't be concerned if there is a tablespoon or so of blood.  If there is more blood than this, lie flat with your bottom higher than your head and apply an ice pack to the area. If the bleeding does not stop within a half an hour or if you feel faint, call our office at [phone] or go to the emergency room.  6. Problems are not common; however, if there is a substantial amount of bleeding, severe pain, chills, fever or difficulty passing urine (very rare) or other problems, you should call us at [phone] or report to the nearest emergency room.  7. Do not stay seated continuously for more than 2-3 hours for a day or two after the procedure.  Tighten your buttock muscles 10-15 times every two hours and take 10-15 deep breaths every 1-2 hours.  Do not spend more than a few minutes on the toilet if you cannot empty your bowel; instead re-visit the toilet at a later time.

## 2016-01-04 NOTE — Progress Notes (Signed)
Jennifer [name] is a 70 year old female who presents for treatment of symptomatic hemorrhoidal disease. She also has history of adenomatous colon polyps and had a colonoscopy on 11/03/2015.  She reports long-standing issues with hemorrhoids which bother her frequently. She also has on and off issues with constipation and hard stool. She has intermittent scant rectal bleeding, anal pain, occasional fecal seepage and smearing and prolapse. Internal hemorrhoids were seen at time of colonoscopy   PROCEDURE NOTE:  The patient presents with symptomatic grade 2-3 internal hemorrhoids, requesting rubber band ligation of her hemorrhoidal disease.  All risks, benefits and alternative forms of therapy were described and informed consent was obtained.   The anorectum was pre-medicated with 0.125% nitroglycerin ointment The decision was made to band the LL (latex-free band) internal hemorrhoid, and the [place] was used to perform band ligation without complication.   Digital anorectal examination was then performed to assure proper positioning of the band, and to adjust the banded tissue as required.  The patient was discharged home without pain or other issues.  Dietary and behavioral recommendations were given and along with follow-up instructions.     The following adjunctive treatments were recommended: She is using an herbal stool softener and laxative which is ineffective. I recommended MiraLAX 17 g daily, particularly when she is traveling as when she travels she often has issues with constipation. I have made her aware that daily MiraLAX would likely be better for her even when she is not traveling.  The patient will return as scheduled for  follow-up and possible additional banding as required. No complications were encountered and the patient tolerated the procedure well.

## 2016-01-12 ENCOUNTER — Ambulatory Visit (INDEPENDENT_AMBULATORY_CARE_PROVIDER_SITE_OTHER): Payer: Medicare Other | Admitting: Sports Medicine

## 2016-01-12 ENCOUNTER — Other Ambulatory Visit: Payer: Self-pay

## 2016-01-12 ENCOUNTER — Encounter: Payer: Self-pay | Admitting: Sports Medicine

## 2016-01-12 VITALS — BP 126/60 | Ht 65.0 in | Wt 120.0 lb

## 2016-01-12 DIAGNOSIS — M2021 Hallux rigidus, right foot: Secondary | ICD-10-CM | POA: Insufficient documentation

## 2016-01-12 DIAGNOSIS — M2022 Hallux rigidus, left foot: Secondary | ICD-10-CM | POA: Diagnosis not present

## 2016-01-12 DIAGNOSIS — M19011 Primary osteoarthritis, right shoulder: Secondary | ICD-10-CM | POA: Diagnosis not present

## 2016-01-12 NOTE — Assessment & Plan Note (Signed)
CT scan of right shoulder 12/2012 reviewed, which demonstrated severe arthritic changes at that time.  Severe degenerative changes and atrophy of the subscapularis and infraspinatus tendons appreciated on ultrasound.  Patient with well compensated AROM.  Minimal pain, so no need for medication/ injection at this time.  Not amenable to shoulder surgery at this time.  Follow up prn.

## 2016-01-12 NOTE — Progress Notes (Signed)
Subjective:    Patient ID: Jennifer [name], female    DOB: Aug 17, 1945, 70 y.o.   MRN: [id number]  HPI Jennifer [name] is a 70 y.o. female that presents for right shoulder pain.  She has known severe degenerative changes in her right shoulder, specifically notable for glenoid humeral arthritic changes see on CT 12/2012.  She reports limited AROM of shoulder, particularly in IR, ER and abduction of the shoulder.  She is very active and continues to train regularly.  She has seen Bjorn Loser for therapy.  She reports intermittent discomfort but rarely needs medication, citing that she seldomly takes even NSAIDs.  She does take tumeric daily.  She does not ice/heat/ or apply topicals to affected area.   ROS:  No numbness, tingling, weakness, swelling.  Additionally, she would like her great toes evaluated, as she notes she has reduced ROM in her big toe bilaterally. RT 2nd toe is starting to cross over 1st toe.  Allergies  Allergen Reactions  . Latex Other (See Comments)    Patient preference   . Penicillins     REACTION: Childhood reaction   Current Outpatient Prescriptions on File Prior to Visit  Medication Sig Dispense Refill  . Ascorbic Acid (VITAMIN C) 100 MG tablet Take 100 mg by mouth 2 (two) times a week.     Marland Kitchen b complex vitamins tablet Take 1 tablet by mouth 2 (two) times a week.     . cholecalciferol (VITAMIN D) 1000 units tablet Take 1,000 Units by mouth daily.    . magnesium oxide (MAG-OX) 400 MG tablet Take 400 mg by mouth daily.     Marland Kitchen omega-3 acid ethyl esters (LOVAZA) 1 G capsule Take 1 g by mouth 2 (two) times a week.      No current facility-administered medications on file prior to visit.    Past Medical History:  Diagnosis Date  . Arthritis   . DJD of shoulder   . Hyperlipidemia   . Insomnia   . Internal hemorrhoids   . Tubular adenoma of colon   . Vitamin D deficiency    Past Surgical History:  Procedure Laterality Date  . FOOT SURGERY Left   . FOOT SURGERY Right    . HIATAL HERNIA REPAIR  1954   age 15  . OOPHORECTOMY    . SHOULDER ARTHROSCOPY Right   . TOTAL ABDOMINAL HYSTERECTOMY W/ BILATERAL SALPINGOOPHORECTOMY  2007   Family History  Problem Relation Age of Onset  . Hypertension Mother   . Breast cancer Mother   . Heart disease Mother   . Heart attack Father 36    deceased  . Prostate cancer Maternal Uncle    Review of Systems Per HPI    Objective:   Physical Exam  Blood pressure 126/60, height 5\' 5"  (1.651 m), weight 120 lb (54.4 kg). Gen: awake, alert, well appearing female, NAD MSK: bilateral great toes with marked decrease in AROM and PROM in flexion and extension of toes. Spurring noted.  Right shoulder: marked atrophy of shoulder muscles, about a 30 degree loss of AROM in aBduction and flexion of right shoulder (can reach about 110 degrees and about 130 degrees with palms up).  Severely decreased AROM in IR.  Moderately decreased AROM in ER.  Neuro: light touch sensation in tact Skin: no erythema or ecchymosis  Ultrasound of right shoulder: Marked atrophy of subscapularis and infraspinatus tendons.  Marked spurring/degenerative changes of GHJ. Mild hypoechoic Fluid appreciated along the biceps tendon. Supraspinatus  intact.  Teres minor  Intact.  AC joint shows mild changes of OA.  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    Assessment & Plan:   Degenerative joint disease of right shoulder CT scan of right shoulder 12/2012 reviewed, which demonstrated severe arthritic changes at that time.  Severe degenerative changes and atrophy of the subscapularis and infraspinatus tendons appreciated on ultrasound.  Patient with well compensated AROM.  Minimal pain, so no need for medication/ injection at this time.  Not amenable to shoulder surgery at this time.  Follow up prn.  Hallux rigidus of both feet Severe restriction of both extension and  flexion of great toe appreciated.  Toe pad provided today for left foot.  Patient to follow up prn.  If improves symptoms, will provide for right foot as well.  Ashly M. Lajuana Ripple, DO PGY-3, [facility]  I observed and examined the patient with the resident and agree with assessment and plan.  Note reviewed and modified by me. Ila Mcgill, MD

## 2016-01-12 NOTE — Assessment & Plan Note (Signed)
Severe restriction of both extension and flexion of great toe appreciated.  Toe pad provided today for left foot.  Patient to follow up prn.  If improves symptoms, will provide for right foot as well.

## 2016-01-27 ENCOUNTER — Encounter: Payer: Medicare Other | Admitting: Internal Medicine

## 2016-02-03 ENCOUNTER — Other Ambulatory Visit: Payer: Self-pay | Admitting: Internal Medicine

## 2016-02-03 DIAGNOSIS — Z1231 Encounter for screening mammogram for malignant neoplasm of breast: Secondary | ICD-10-CM

## 2016-02-09 ENCOUNTER — Encounter: Payer: Self-pay | Admitting: Internal Medicine

## 2016-02-09 ENCOUNTER — Ambulatory Visit (INDEPENDENT_AMBULATORY_CARE_PROVIDER_SITE_OTHER): Payer: Medicare Other | Admitting: Internal Medicine

## 2016-02-09 VITALS — BP 110/60 | HR 74 | Ht 65.0 in | Wt 125.0 lb

## 2016-02-09 DIAGNOSIS — K648 Other hemorrhoids: Secondary | ICD-10-CM

## 2016-02-09 NOTE — Progress Notes (Signed)
Jennifer [name] is a 70 year old female with a history of symptomatic hemorrhoidal disease who presents for consideration of repeat banding. Initial hemorrhoidal banding performed on 01/04/2016 for symptoms of intermittent scant rectal bleeding, and rectal pain, occasional fecal seepage and smearing with prolapse symptoms Internal hemorrhoids were seen at colonoscopy in July 2017 She tolerated first banding without issue. States symptoms may be slightly better. She is interested in pursuing additional banding today for residual prolapse symptoms. She request to place 2 bands today to complete banding protocol without the need for a third office visit. We spent time discussing the risks, benefits and alternatives to placing 2 bands today. We also discussed that this can lead to a higher complication rate specifically pain and bleeding. She understands these risks, verbalizes understanding, and wishes to proceed   PROCEDURE NOTE:  The patient presents with symptomatic grade 2-3 internal hemorrhoids, requesting rubber band ligation of her hemorrhoidal disease.  All risks, benefits and alternative forms of therapy were described and informed consent was obtained.   The anorectum was pre-medicated with 0.125% nitroglycerin ointment The decision was made to band the RP and RA internal hemorrhoid, and the [place] was used to perform band ligation without complication.   Digital anorectal examination was then performed to assure proper positioning of the bands, and to adjust the banded tissue as required.  The patient was discharged home without pain or other issues.  Dietary and behavioral recommendations were given and along with follow-up instructions.     The following adjunctive treatments were recommended: Continue MiraLAX 17 g daily  At the patient's request she will return as needed for follow-up and possible additional banding as required. No complications were encountered and the  patient tolerated the procedure well. The patient reported feeling some mild aching but no pinching or pain. She felt the sensation to have a bowel movement. No abdominal cramping. We discussed repeat rectal exam to potentially loosen/reposition band(s) but after a discussion she preferred not to do so. Given her well appearance and lack of reported pain I was in agreement, and asked that she call me should she develop pain, pinching or further discomfort. She voiced understanding

## 2016-02-10 ENCOUNTER — Telehealth: Payer: Self-pay | Admitting: Internal Medicine

## 2016-02-10 NOTE — Telephone Encounter (Signed)
Noted  

## 2016-02-22 ENCOUNTER — Encounter: Payer: Medicare Other | Admitting: Internal Medicine

## 2016-03-07 ENCOUNTER — Ambulatory Visit: Payer: Medicare Other

## 2016-03-28 ENCOUNTER — Ambulatory Visit
Admission: RE | Admit: 2016-03-28 | Discharge: 2016-03-28 | Disposition: A | Discharge status: Home / Self Care | Payer: Medicare Other | Source: Ambulatory Visit | Attending: Internal Medicine | Admitting: Internal Medicine

## 2016-03-28 DIAGNOSIS — M8588 Other specified disorders of bone density and structure, other site: Secondary | ICD-10-CM | POA: Diagnosis not present

## 2016-03-28 DIAGNOSIS — Z1231 Encounter for screening mammogram for malignant neoplasm of breast: Secondary | ICD-10-CM | POA: Diagnosis not present

## 2016-03-28 DIAGNOSIS — Z13820 Encounter for screening for osteoporosis: Secondary | ICD-10-CM | POA: Diagnosis not present

## 2016-03-28 DIAGNOSIS — Z780 Asymptomatic menopausal state: Secondary | ICD-10-CM | POA: Diagnosis not present

## 2016-04-13 ENCOUNTER — Telehealth: Payer: Self-pay | Admitting: Internal Medicine

## 2016-04-13 NOTE — Telephone Encounter (Signed)
Left message for patient to call back  

## 2016-04-13 NOTE — Telephone Encounter (Signed)
Patient advised to try rectticare and sitz baths for external hemorrhoids.  She is advised to also add Miralax to soften the stool.  She will call back if still having symptoms next week

## 2016-04-19 ENCOUNTER — Telehealth: Payer: Self-pay | Admitting: Internal Medicine

## 2016-04-19 NOTE — Telephone Encounter (Signed)
Pt calling to schedule another banding. Pt scheduled for 06/20/16@3 :30pm. Pt aware of appt.

## 2016-05-31 ENCOUNTER — Ambulatory Visit: Payer: Medicare Other | Admitting: Sports Medicine

## 2016-06-20 ENCOUNTER — Ambulatory Visit (INDEPENDENT_AMBULATORY_CARE_PROVIDER_SITE_OTHER): Payer: Medicare Other | Admitting: Internal Medicine

## 2016-06-20 ENCOUNTER — Encounter: Payer: Self-pay | Admitting: Internal Medicine

## 2016-06-20 VITALS — BP 106/64 | HR 60 | Ht 64.75 in | Wt 125.2 lb

## 2016-06-20 DIAGNOSIS — R195 Other fecal abnormalities: Secondary | ICD-10-CM | POA: Diagnosis not present

## 2016-06-20 DIAGNOSIS — K648 Other hemorrhoids: Secondary | ICD-10-CM

## 2016-06-20 NOTE — Patient Instructions (Addendum)
Email [place] through MyChart to let us know how you are doing after the hemorrhoidal banding.  HEMORRHOID BANDING PROCEDURE    FOLLOW-UP CARE   1. The procedure you have had should have been relatively painless since the banding of the area involved does not have nerve endings and there is no pain sensation.  The rubber band cuts off the blood supply to the hemorrhoid and the band may fall off as soon as 48 hours after the banding (the band may occasionally be seen in the toilet bowl following a bowel movement). You may notice a temporary feeling of fullness in the rectum which should respond adequately to plain Tylenol or Motrin.  2. Following the banding, avoid strenuous exercise that evening and resume full activity the next day.  A sitz bath (soaking in a warm tub) or bidet is soothing, and can be useful for cleansing the area after bowel movements.     3. To avoid constipation, take two tablespoons of natural wheat bran, natural oat bran, flax, Benefiber or any over the counter fiber supplement and increase your water intake to 7-8 glasses daily.    4. Unless you have been prescribed anorectal medication, do not put anything inside your rectum for two weeks: No suppositories, enemas, fingers, etc.  5. Occasionally, you may have more bleeding than usual after the banding procedure.  This is often from the untreated hemorrhoids rather than the treated one.  Don't be concerned if there is a tablespoon or so of blood.  If there is more blood than this, lie flat with your bottom higher than your head and apply an ice pack to the area. If the bleeding does not stop within a half an hour or if you feel faint, call our office at [phone] or go to the emergency room.  6. Problems are not common; however, if there is a substantial amount of bleeding, severe pain, chills, fever or difficulty passing urine (very rare) or other problems, you should call us at [phone] or report to the nearest  emergency room.  7. Do not stay seated continuously for more than 2-3 hours for a day or two after the procedure.  Tighten your buttock muscles 10-15 times every two hours and take 10-15 deep breaths every 1-2 hours.  Do not spend more than a few minutes on the toilet if you cannot empty your bowel; instead re-visit the toilet at a later time.

## 2016-06-20 NOTE — Progress Notes (Signed)
Jennifer [name] is a 71 year old female with a history of symptomatic hemorrhoidal disease. Hemorrhoids have been symptomatic prior to hemorrhoidal banding 3 which completed on 02/09/2016 Symptoms prior to banding included intermittent rectal bleeding, rectal pain, occasional fecal seepage and sparing with prolapse symptoms.  She did very well and had improvement in hemorrhoidal symptoms overall after her banding sessions. In December she developed what sounds like a thrombosed external hemorrhoid. She had a very prominent external bulge which was exquisitely painful and tender. She treated this with sitz baths and over 2-3 days the pain improved and resolved.' Since this time she has noted intermittent prolapse hemorrhoids which she states are "small" and self reduce after defecation though it can be associated with very scant rectal bleeding and discomfort. No further perianal pain or discomfort. No pain with passing stool  She has used MiraLAX 17 g on occasion when stools are hard or dry  PROCEDURE NOTE: The patient presents with symptomatic grade 2 hemorrhoids, requesting rubber band ligation of her hemorrhoidal disease.  All risks, benefits and alternative forms of therapy were described and informed consent was obtained.  In the Left Lateral Decubitus position anoscopic examination revealed grade 2 hemorrhoids in the LL position(s).  there was no evidence of external hemorrhoids today nor fissure. Soft brown stool in the vault without masses. The anorectum was pre-medicated with 0.125% nitroglycerin ointment The decision was made to band the LL internal hemorrhoid, and the Icard was used to perform band ligation without complication.  Digital anorectal examination was then performed to assure proper positioning of the band, and to adjust the banded tissue as required.  The patient was discharged home without pain or other issues.  Dietary and behavioral recommendations were given and  along with follow-up instructions.     The following adjunctive treatments were recommended: Add Colace 100-100 g at bedtime or MiraLAX on a more scheduled basis Notify me should she develop external hemorrhoid symptom. We went over this in detail and should this recur she would likely need to see surgery acutely for incision and drainage.  We also discussed how she may have some rectal prolapse symptoms which are non-hemorrhoid related. I explained to banding would not improve the symptoms. However given hemorrhoid seen on anoscopy today it was reasonable to reattempt banding which seemed to help previously.  I advised that she notify me after 2-4 weeks regarding her response to banding today. Any symptoms remain she is asked to contact me for follow-up. She is happy with this plan.   No complications were encountered and the patient tolerated the procedure well.

## 2016-06-27 NOTE — Progress Notes (Signed)
[name] [name], [place] [address] Phone: [phone] Subjective:    I'm seeing this patient by the request  of:  GATES,ROBERT NEVILL, MD   CC: Right hip pain right shoulder pain   [id number]  Jennifer [name] is a 71 y.o. female coming in with complaint of right hip pain. Started 4 months ago.  Has done PT, Acupuncture as well as Actiq with no significant improvement. Patient is having pain on the lateral aspect of the hip that seems to go posteriorly. Mild groin pain but very minimal. Patient's states that it seems to be more annoying. Still able to workout mostly a daily basis. Has been doing some different Machines and patient does more of a lateral movement that they are exacerbated. Patient since then over-the-counter medications have not been very helpful either. Not affecting daily activities.   patient is also complaining of right shoulder pain. Patient had CT scan in 2004 that was independently visualized by me showing severe degenerative changes. Patient's on another provider in the ultrasound imaging showed significant arthritic changes as well. Patient has been doing relatively well but wants to make sure that she does not wear it out. Still lifting weights. Patient is wondering what activities would be beneficial and which ones to avoid. Patient has discussed possible surgical intervention with 2 different surgeons but is decided not to do them.  Left sided foot pain. Past pedicle history significant for a first toe fusion. Since then she is unfortunately continues to have pain. Has custom orthotics. Patient has change shoes multiple times. Wondering if anything else can be done.  Past Medical History:  Diagnosis Date  . Arthritis   . DJD of shoulder   . Hyperlipidemia   . Insomnia   . Internal hemorrhoids   . Tubular adenoma of colon   . Vitamin D deficiency    Past Surgical History:  Procedure Laterality Date  . FOOT SURGERY  Left   . FOOT SURGERY Right   . HIATAL HERNIA REPAIR  1954   age 60  . OOPHORECTOMY    . SHOULDER ARTHROSCOPY Right   . TOTAL ABDOMINAL HYSTERECTOMY W/ BILATERAL SALPINGOOPHORECTOMY  2007   Social History   Social History  . Marital status: Married    Spouse name: N/A  . Number of children: 2  . Years of education: N/A   Social History Main Topics  . Smoking status: Never Smoker  . Smokeless tobacco: Never Used  . Alcohol use 0.0 oz/week     Comment: 3 per week  . Drug use: No  . Sexual activity: Yes   Other Topics Concern  . None   Social History Narrative  . None   Allergies  Allergen Reactions  . Latex Other (See Comments)    Patient preference   . Penicillins     REACTION: Childhood reaction   Family History  Problem Relation Age of Onset  . Hypertension Mother   . Breast cancer Mother   . Heart disease Mother   . Heart attack Father 98    deceased  . Prostate cancer Maternal Uncle     Past medical history, social, surgical and family history all reviewed in electronic medical record.  No pertanent information unless stated regarding to the chief complaint.   Review of Systems:Review of systems updated and as accurate as of 06/28/16  No headache, visual changes, nausea, vomiting, diarrhea, constipation, dizziness, abdominal pain, skin rash, fevers, chills, night sweats, weight  loss, swollen lymph nodes,chest pain, shortness of breath, mood changes. Positive muscle aches  Objective  Blood pressure 120/80, pulse (!) 59, height 5' 4.75" (1.645 m), weight 125 lb (56.7 kg), SpO2 99 %. Systems examined below as of 06/28/16   General: No apparent distress alert and oriented x3 mood and affect normal, dressed appropriately.  HEENT: Pupils equal, extraocular movements intact  Respiratory: Patient's speak in full sentences and does not appear short of breath  Cardiovascular: No lower extremity edema, non tender, no erythema  Skin: Warm dry intact with no signs of  infection or rash on extremities or on axial skeleton.  Abdomen: Soft nontender  Neuro: Cranial nerves II through XII are intact, neurovascularly intact in all extremities with 2+ DTRs and 2+ pulses.  Lymph: No lymphadenopathy of posterior or anterior cervical chain or axillae bilaterally.  Gait normal with good balance and coordination.  MSK:  Non tender with full range of motion and good stability and symmetric strength and tone of , elbows, wrist, \, knee and ankles bilaterally. Changes of multiple joints. Shoulder: Right Inspection shows the patient does have atrophy of the shoulder girdle compared to the contralateral side Diffuse tenderness noted but mild Patient does have for flexion of 160, external rotation of 5, internal rotation to sacrum crepitus noted Rotator cuff strength 4 out of 5 compared to contralateral sign Positive impingement Speeds and Yergason's tests normal. No labral pathology noted with negative Obrien's, negative clunk and good stability. Normal scapular function observed. Positive painful arc No apprehension sign Contralateral shoulder has some mild pain with impingement but otherwise unremarkable.  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 Patient does have a narrow foot. Severe collection of the transverse arch.  FSE:[id number]  ROM IR: 15 Deg, ER: 30 Deg, Flexion: 120 Deg, Extension: 100 Deg, Abduction: 45 Deg, Adduction: 45 Deg Strength 4-5 strength Pelvic alignment unremarkable to inspection and palpation. Standing hip rotation and gait without trendelenburg sign / unsteadiness. Greater trochanter TTP over glut tendon as well.  + FABER No SI joint tenderness and normal minimal SI movement. Contralateral hip unremarkable.  Procedure note [id number]; 15 minutes spent for Therapeutic exercises as stated in above notes.  This included exercises focusing on stretching,  strengthening, with significant focus on eccentric aspects.  Hip strengthening exercises which included:  Pelvic tilt/bracing to help with proper recruitment of the lower abs and pelvic floor muscles  Glute strengthening to properly contract glutes without over-engaging low back and hamstrings - prone hip extension and glute bridge exercises Proper stretching techniques to increase effectiveness for the hip flexors, groin, quads, piriformic and low back when appropriate    Proper technique shown and discussed handout in great detail with ATC.  All questions were discussed and answered.       Impression and Recommendations:     This case required medical decision making of moderate complexity.      Note: This dictation was prepared with Dragon dictation along with smaller phrase technology. Any transcriptional errors that result from this process are unintentional.

## 2016-06-28 ENCOUNTER — Ambulatory Visit (INDEPENDENT_AMBULATORY_CARE_PROVIDER_SITE_OTHER): Payer: Medicare Other | Admitting: Family Medicine

## 2016-06-28 ENCOUNTER — Encounter: Payer: Self-pay | Admitting: Family Medicine

## 2016-06-28 ENCOUNTER — Ambulatory Visit (INDEPENDENT_AMBULATORY_CARE_PROVIDER_SITE_OTHER)
Admission: RE | Admit: 2016-06-28 | Discharge: 2016-06-28 | Disposition: A | Discharge status: Home / Self Care | Payer: Medicare Other | Source: Ambulatory Visit | Attending: Family Medicine | Admitting: Family Medicine

## 2016-06-28 VITALS — BP 120/80 | HR 59 | Ht 64.75 in | Wt 125.0 lb

## 2016-06-28 DIAGNOSIS — M25551 Pain in right hip: Secondary | ICD-10-CM

## 2016-06-28 DIAGNOSIS — M19011 Primary osteoarthritis, right shoulder: Secondary | ICD-10-CM

## 2016-06-28 DIAGNOSIS — M2021 Hallux rigidus, right foot: Secondary | ICD-10-CM | POA: Diagnosis not present

## 2016-06-28 DIAGNOSIS — M2022 Hallux rigidus, left foot: Secondary | ICD-10-CM | POA: Diagnosis not present

## 2016-06-28 DIAGNOSIS — M7601 Gluteal tendinitis, right hip: Secondary | ICD-10-CM | POA: Insufficient documentation

## 2016-06-28 MED ORDER — VITAMIN D (ERGOCALCIFEROL) 1.25 MG (50000 UNIT) PO CAPS: 50000.0000 [IU] | ORAL_CAPSULE | ORAL | 0 refills | Status: DC

## 2016-06-28 NOTE — Assessment & Plan Note (Signed)
No true treatment left.  Replacement if pain is severe.  Will do injection if worsening.  Discussed what to avoid and will try topical NSAIds.  RTC in 3-4 weeks.

## 2016-06-28 NOTE — Assessment & Plan Note (Signed)
Discussed over-the-counter orthotics. Patient has been custom orthotics previously does not want to do them again. We discussed which activities to do a which ones to avoid. Avoid being barefoot. Discussed proper timing of the shoes. Patient will come back and see me again 4 weeks

## 2016-06-28 NOTE — Assessment & Plan Note (Signed)
Gluteal tendinitis noted. Patient given home exercises today. We'll try topical anti-inflammatories, we will do once weekly vitamin D with patient's history of osteopenia and see if this will help with muscle strength and endurance. Worsening symptoms we'll consider ultrasound and possible injections at follow-up in 4 weeks.

## 2016-06-28 NOTE — Patient Instructions (Signed)
Good to see you.  Ice 20 minutes 2 times daily. Usually after activity and before bed. Exercises 3 times a week.  pennsaid pinkie amount topically 2 times daily as needed.  Try to avoid anything with a large stride.  For the shoulder  Keep hands within peripheral vision. No heavy lifting.  Turmeric 500mg  1-2 times a day  Tart cherry extract any dose at night For the foot  Spenco orthotics "total support" online would be great  Try HOKA Do not lace last eye of the shoe Avoid being barefoot.  See me again in 4 weeks.

## 2016-07-20 ENCOUNTER — Encounter: Payer: Medicare Other | Admitting: Internal Medicine

## 2016-07-27 ENCOUNTER — Ambulatory Visit (INDEPENDENT_AMBULATORY_CARE_PROVIDER_SITE_OTHER): Payer: Medicare Other | Admitting: Family Medicine

## 2016-07-27 ENCOUNTER — Encounter: Payer: Self-pay | Admitting: Family Medicine

## 2016-07-27 DIAGNOSIS — M2022 Hallux rigidus, left foot: Secondary | ICD-10-CM

## 2016-07-27 DIAGNOSIS — M19011 Primary osteoarthritis, right shoulder: Secondary | ICD-10-CM | POA: Diagnosis not present

## 2016-07-27 DIAGNOSIS — M7601 Gluteal tendinitis, right hip: Secondary | ICD-10-CM

## 2016-07-27 DIAGNOSIS — M2021 Hallux rigidus, right foot: Secondary | ICD-10-CM

## 2016-07-27 NOTE — Assessment & Plan Note (Signed)
Stable. Does not want have any injection at this time. Continue with conservative therapy.

## 2016-07-27 NOTE — Patient Instructions (Signed)
Good to see you Jennifer [name] is your friend.  Keep doing the exercises but add.. Exercises on wall.  Heel and butt touching.  Raise leg 6 inches and hold 2 seconds.  Down slow for count of 4 seconds.  1 set of 30 reps daily on both sides.  Stay active Tried manipulation and I want to see you again in 6 weeks.  Keep doing the vitamins See me again in 6 weeks.

## 2016-07-27 NOTE — Assessment & Plan Note (Signed)
Patient doing very well with the over-the-counter orthotics and no changes needed.

## 2016-07-27 NOTE — Progress Notes (Signed)
Pre-visit discussion using our clinic review tool. No additional management support is needed unless otherwise documented below in the visit note.  

## 2016-07-27 NOTE — Progress Notes (Signed)
[name] [address], [place] [address] Phone: [phone] Subjective:    I'm seeing this patient by the request  of:  GATES,ROBERT NEVILL, MD   CC: Right hip pain right shoulder pain f/u  [id number]  Jennifer [name] is a 71 y.o. female coming in with complaint of right hip pain. Patient sent have more of a bursitis as well as a right-sided gluteal tendinitis. Given home exercises and patient states that she has made significant improvement about 50-60%. Still some tightness on the lateral aspect of the hip.   patient is also complaining of right shoulder pain. Patient had CT scan in 2004 that was independently visualized by me showing severe degenerative changes. Patient's on another provider in the ultrasound imaging showed significant arthritic changes as well. Patient has continued with conservative therapy at this time. Patient states right shoulder was doing much better. Still has aching pain. Sometimes feels like she is malaligned.   Was also having left sided foot pain. Patient was put in an over-the-counter orthotics. Patient has had a history of fusion of the first toe. Patient states much better the over-the-counter orthotics.  Past Medical History:  Diagnosis Date  . Arthritis   . DJD of shoulder   . Hyperlipidemia   . Insomnia   . Internal hemorrhoids   . Tubular adenoma of colon   . Vitamin D deficiency    Past Surgical History:  Procedure Laterality Date  . FOOT SURGERY Left   . FOOT SURGERY Right   . HIATAL HERNIA REPAIR  1954   age 36  . OOPHORECTOMY    . SHOULDER ARTHROSCOPY Right   . TOTAL ABDOMINAL HYSTERECTOMY W/ BILATERAL SALPINGOOPHORECTOMY  2007   Social History   Social History  . Marital status: Married    Spouse name: N/A  . Number of children: 2  . Years of education: N/A   Social History Main Topics  . Smoking status: Never Smoker  . Smokeless tobacco: Never Used  . Alcohol use 0.0 oz/week   Comment: 3 per week  . Drug use: No  . Sexual activity: Yes   Other Topics Concern  . None   Social History Narrative  . None   Allergies  Allergen Reactions  . Latex Other (See Comments)    Patient preference   . Penicillins     REACTION: Childhood reaction   Family History  Problem Relation Age of Onset  . Hypertension Mother   . Breast cancer Mother   . Heart disease Mother   . Heart attack Father 35    deceased  . Prostate cancer Maternal Uncle     Past medical history, social, surgical and family history all reviewed in electronic medical record.  No pertanent information unless stated regarding to the chief complaint.   Review of Systems:Review of systems updated and as accurate as of 07/27/16  No headache, visual changes, nausea, vomiting, diarrhea, constipation, dizziness, abdominal pain, skin rash, fevers, chills, night sweats, weight loss, swollen lymph nodes,chest pain, shortness of breath, mood changes. Positive muscle aches  Objective  Blood pressure 116/64, pulse (!) 59, resp. rate 16, weight 126 lb (57.2 kg), SpO2 99 %. Systems examined below as of 07/27/16   General: No apparent distress alert and oriented x3 mood and affect normal, dressed appropriately.  HEENT: Pupils equal, extraocular movements intact  Respiratory: Patient's speak in full sentences and does not appear short of breath  Cardiovascular: No lower extremity edema, non  tender, no erythema  Skin: Warm dry intact with no signs of infection or rash on extremities or on axial skeleton.  Abdomen: Soft nontender  Neuro: Cranial nerves II through XII are intact, neurovascularly intact in all extremities with 2+ DTRs and 2+ pulses.  Lymph: No lymphadenopathy of posterior or anterior cervical chain or axillae bilaterally.  Gait normal with good balance and coordination.  MSK:  Non tender with full range of motion and good stability and symmetric strength and tone of , elbows, wrist, \, knee and  ankles bilaterally. Changes of multiple joints. Shoulder: Right Inspection shows the patient does have atrophy of the shoulder girdle compared to the contralateral side Less tenderness than previous exam Patient does have for flexion of 160, external rotation of 5, internal rotation to sacrum crepitus noted Rotator cuff strength L4-5. Still some mild impingement. Speeds and Yergason's tests normal. No labral pathology noted with negative Obrien's, negative clunk and good stability. Normal scapular function observed. Continued painful arc No apprehension sign Contralateral shoulder has some mild pain with impingement but otherwise unremarkable.  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 Patient does have a narrow foot. Breakdown of the transverse arch  SWF:UXNAT  ROM IR: 15 Deg, ER: 30 Deg, Flexion: 120 Deg, Extension: 100 Deg, Abduction: 45 Deg, Adduction: 25 Deg Strength 4-5 strength Pelvic alignment unremarkable to inspection and palpation. Standing hip rotation and gait without trendelenburg sign / unsteadiness. Greater trochanter TTP over glut tendon as well but improved from previous exam + FABER still noted but increasing range of motion. No SI joint tenderness and normal minimal SI movement. Contralateral hip unremarkable.         Impression and Recommendations:     This case required medical decision making of moderate complexity.      Note: This dictation was prepared with Dragon dictation along with smaller phrase technology. Any transcriptional errors that result from this process are unintentional.

## 2016-07-27 NOTE — Assessment & Plan Note (Signed)
50% improvement with conservative therapy. Continue to be active otherwise. Avoiding certain activities. Patient declined physical therapy. Follow-up again with me in 6-8 weeks

## 2016-07-28 ENCOUNTER — Ambulatory Visit: Payer: Medicare Other | Admitting: Sports Medicine

## 2016-08-09 DIAGNOSIS — G479 Sleep disorder, unspecified: Secondary | ICD-10-CM | POA: Diagnosis not present

## 2016-08-09 DIAGNOSIS — R0982 Postnasal drip: Secondary | ICD-10-CM | POA: Diagnosis not present

## 2016-08-09 DIAGNOSIS — Z0001 Encounter for general adult medical examination with abnormal findings: Secondary | ICD-10-CM | POA: Diagnosis not present

## 2016-08-09 DIAGNOSIS — E559 Vitamin D deficiency, unspecified: Secondary | ICD-10-CM | POA: Diagnosis not present

## 2016-08-09 DIAGNOSIS — Z1389 Encounter for screening for other disorder: Secondary | ICD-10-CM | POA: Diagnosis not present

## 2016-08-09 DIAGNOSIS — J309 Allergic rhinitis, unspecified: Secondary | ICD-10-CM | POA: Diagnosis not present

## 2016-08-09 DIAGNOSIS — E7800 Pure hypercholesterolemia, unspecified: Secondary | ICD-10-CM | POA: Diagnosis not present

## 2016-08-09 DIAGNOSIS — Z23 Encounter for immunization: Secondary | ICD-10-CM | POA: Diagnosis not present

## 2016-09-02 ENCOUNTER — Ambulatory Visit: Payer: Self-pay

## 2016-09-02 ENCOUNTER — Encounter: Payer: Self-pay | Admitting: Family Medicine

## 2016-09-02 ENCOUNTER — Ambulatory Visit (INDEPENDENT_AMBULATORY_CARE_PROVIDER_SITE_OTHER): Payer: Medicare Other | Admitting: Family Medicine

## 2016-09-02 VITALS — BP 102/70 | HR 69 | Ht 64.75 in | Wt 123.0 lb

## 2016-09-02 DIAGNOSIS — M7601 Gluteal tendinitis, right hip: Secondary | ICD-10-CM

## 2016-09-02 DIAGNOSIS — M999 Biomechanical lesion, unspecified: Secondary | ICD-10-CM | POA: Diagnosis not present

## 2016-09-02 NOTE — Assessment & Plan Note (Signed)
Patient given injection today. Neer complete resolution of pain immediately. We discussed icing regimen and home exercises starting again in 48 hours. We discussed which activities to avoid. We discussed the importance of core strengthening and hip abductor strengthening.

## 2016-09-02 NOTE — Assessment & Plan Note (Signed)
Decision today to treat with OMT was based on Physical Exam  After verbal consent patient was treated with  ME, FPR techniques in cervical, thoracic, lumbar and sacral areas  Patient tolerated the procedure well with improvement in symptoms  Patient given exercises, stretches and lifestyle modifications  See medications in patient instructions if given  Patient will follow up in 4-6 weeks 

## 2016-09-02 NOTE — Patient Instructions (Signed)
Good to see you  Exercises 2 times a week.  Continue the vitamins Colace 100mg  daily with 17g of miralax.  See em again in 4-6 weeks

## 2016-09-02 NOTE — Progress Notes (Signed)
[name] [place], [place] [address] Phone: [phone] Subjective:    I'm seeing this patient by the request  of:  Josetta Huddle, MD   CC: Right hip pain right shoulder pain f/u  [id number]  Jennifer [name] is a 71 y.o. female coming in with complaint of right hip pain. Patient sent have more of a bursitis as well as a right-sided gluteal tendinitis. Patient had been making improvement with conservative therapy but now seems to have plateaued. Pain on the lateral aspect of the hip and the posterior aspect. Still seems to be more chronic. Was responding to osteopathic manipulation some. Wanted to know what else aggressive therapies could be done. Continues all the medications and has been fairly religious doing the exercises.   patient is also complaining of right shoulder pain. Patient had CT scan in 2004 that was independently visualized by me showing severe degenerative changes. Doing well with conservative therapy.   Past Medical History:  Diagnosis Date  . Arthritis   . DJD of shoulder   . Hyperlipidemia   . Insomnia   . Internal hemorrhoids   . Tubular adenoma of colon   . Vitamin D deficiency    Past Surgical History:  Procedure Laterality Date  . FOOT SURGERY Left   . FOOT SURGERY Right   . HIATAL HERNIA REPAIR  1954   age 77  . OOPHORECTOMY    . SHOULDER ARTHROSCOPY Right   . TOTAL ABDOMINAL HYSTERECTOMY W/ BILATERAL SALPINGOOPHORECTOMY  2007   Social History   Social History  . Marital status: Married    Spouse name: N/A  . Number of children: 2  . Years of education: N/A   Social History Main Topics  . Smoking status: Never Smoker  . Smokeless tobacco: Never Used  . Alcohol use 0.0 oz/week     Comment: 3 per week  . Drug use: No  . Sexual activity: Yes   Other Topics Concern  . None   Social History Narrative  . None   Allergies  Allergen Reactions  . Latex Other (See Comments)    Patient preference     . Penicillins     REACTION: Childhood reaction   Family History  Problem Relation Age of Onset  . Hypertension Mother   . Breast cancer Mother   . Heart disease Mother   . Heart attack Father 32       deceased  . Prostate cancer Maternal Uncle     Past medical history, social, surgical and family history all reviewed in electronic medical record.  No pertanent information unless stated regarding to the chief complaint.   Review of Systems: No headache, visual changes, nausea, vomiting, diarrhea, constipation, dizziness, abdominal pain, skin rash, fevers, chills, night sweats, weight loss, swollen lymph nodes, body aches, joint swelling, chest pain, shortness of breath, mood changes.  Positive muscle aches  Objective  Blood pressure 102/70, pulse 69, height 5' 4.75" (1.645 m), weight 123 lb (55.8 kg), SpO2 98 %.   Systems examined below as of 09/02/16 General: NAD A&O x3 mood, affect normal  HEENT: Pupils equal, extraocular movements intact no nystagmus Respiratory: not short of breath at rest or with speaking Cardiovascular: No lower extremity edema, non tender Skin: Warm dry intact with no signs of infection or rash on extremities or on axial skeleton. Abdomen: Soft nontender, no masses Neuro: Cranial nerves  intact, neurovascularly intact in all extremities with 2+ DTRs and 2+ pulses.  Lymph: No lymphadenopathy appreciated today  Gait normal with good balance and coordination.  MSK: Non tender with full range of motion and good stability and symmetric strength and tone of shoulders, elbows, wrist,  knee and ankles bilaterally.  . Moderate arthritic changes of multiple joints  [id number]  ROM IR: 15 Deg, ER: 30 Deg, Flexion: 120 Deg, Extension: 100 Deg, Abduction: 45 Deg, Adduction: 25 Deg Strength 4-5 strength Pelvic alignment unremarkable to inspection and palpation. Standing hip rotation and gait without trendelenburg sign / unsteadiness. Tender to palpation over the  gluteal tendon insertion worsening from previous exam. Worsening Corky Sox on the right side.. No SI joint tenderness and normal minimal SI movement. Contralateral hip unremarkable.   Osteopathic findings C2 flexed rotated and side bent right C4 flexed rotated and side bent left C6 flexed rotated and side bent left T3 extended rotated and side bent right inhaled third rib T8 extended rotated and side bent right  L2 flexed rotated and side bent right Sacrum right on right  Procedure: Real-time Ultrasound Guided Injection of right gluteal tendon sheath at insertion Device: GE Logiq Q7 Ultrasound guided injection is preferred based studies that show increased duration, increased effect, greater accuracy, decreased procedural pain, increased response rate, and decreased cost with ultrasound guided versus blind injection.  Verbal informed consent obtained.  Time-out conducted.  Noted no overlying erythema, induration, or other signs of local infection.  Skin prepped in a sterile fashion.  Local anesthesia: Topical Ethyl chloride.  With sterile technique and under real time ultrasound guidance:  With a 21-gauge 2 and she'll patient was injected with a total of 1 mL of 0.5% Marcaine and 1 mL of Kenalog 40 mg/dL into the right gluteal tendon sheath at the insertion on the greater trochanteric area. Completed without difficulty  Pain immediately resolved suggesting accurate placement of the medication.  Advised to call if fevers/chills, erythema, induration, drainage, or persistent bleeding.  Images permanently stored and available for review in the ultrasound unit.  Impression: Technically successful ultrasound guided injection.    Impression and Recommendations:     This case required medical decision making of moderate complexity.      Note: This dictation was prepared with Dragon dictation along with smaller phrase technology. Any transcriptional errors that result from this process are  unintentional.

## 2016-09-05 ENCOUNTER — Ambulatory Visit: Payer: Medicare Other | Admitting: Family Medicine

## 2016-10-03 ENCOUNTER — Ambulatory Visit (INDEPENDENT_AMBULATORY_CARE_PROVIDER_SITE_OTHER): Payer: Medicare Other | Admitting: Family Medicine

## 2016-10-03 ENCOUNTER — Encounter: Payer: Self-pay | Admitting: Family Medicine

## 2016-10-03 VITALS — BP 110/74 | HR 63 | Ht 64.75 in | Wt 123.0 lb

## 2016-10-03 DIAGNOSIS — M999 Biomechanical lesion, unspecified: Secondary | ICD-10-CM | POA: Diagnosis not present

## 2016-10-03 DIAGNOSIS — M7601 Gluteal tendinitis, right hip: Secondary | ICD-10-CM

## 2016-10-03 NOTE — Assessment & Plan Note (Signed)
Decision today to treat with OMT was based on Physical Exam  After verbal consent patient was treated with HVLA, ME, FPR techniques in cervical, thoracic, lumbar and sacral areas  Patient tolerated the procedure well with improvement in symptoms  Patient given exercises, stretches and lifestyle modifications  See medications in patient instructions if given  Patient will follow up in 4-8 weeks 

## 2016-10-03 NOTE — Assessment & Plan Note (Signed)
Significant improvement. We discussed icing regimen. We discussed which activities doing which ones to avoid. Patient will start increasing activity as tolerated. Worsening symptoms could be a candidate for PRP.

## 2016-10-03 NOTE — Patient Instructions (Signed)
Good to see you  You are doing great  Keep it up  See me again in 2 months

## 2016-10-03 NOTE — Progress Notes (Signed)
[name] [place], [place] [address] Phone: [phone] Subjective:    I'm seeing this patient by the request  of:  Josetta Huddle, MD   CC: Right hip pain right shoulder pain f/u  [id number]  Jennifer [name] is a 71 y.o. female coming in with complaint of right hip pain. Patient sent have more of a bursitis as well as a right-sided gluteal tendinitis. Patient was given gluteal tendon injection and states that she is 95 no 100% better. Feels that she is doing very well. medications and has been fairly religious doing the exercises.   has had back and neck pain in the past. Responded well to manipulation. Feels like she has had significant tightness after traveling extensively over the course last several weeks.   Past Medical History:  Diagnosis Date  . Arthritis   . DJD of shoulder   . Hyperlipidemia   . Insomnia   . Internal hemorrhoids   . Tubular adenoma of colon   . Vitamin D deficiency    Past Surgical History:  Procedure Laterality Date  . FOOT SURGERY Left   . FOOT SURGERY Right   . HIATAL HERNIA REPAIR  1954   age 5  . OOPHORECTOMY    . SHOULDER ARTHROSCOPY Right   . TOTAL ABDOMINAL HYSTERECTOMY W/ BILATERAL SALPINGOOPHORECTOMY  2007   Social History   Social History  . Marital status: Married    Spouse name: N/A  . Number of children: 2  . Years of education: N/A   Social History Main Topics  . Smoking status: Never Smoker  . Smokeless tobacco: Never Used  . Alcohol use 0.0 oz/week     Comment: 3 per week  . Drug use: No  . Sexual activity: Yes   Other Topics Concern  . None   Social History Narrative  . None   Allergies  Allergen Reactions  . Latex Other (See Comments)    Patient preference   . Penicillins     REACTION: Childhood reaction   Family History  Problem Relation Age of Onset  . Hypertension Mother   . Breast cancer Mother   . Heart disease Mother   . Heart attack Father 65   deceased  . Prostate cancer Maternal Uncle     Past medical history, social, surgical and family history all reviewed in electronic medical record.  No pertanent information unless stated regarding to the chief complaint.   Review of Systems: No headache, visual changes, nausea, vomiting, diarrhea, constipation, dizziness, abdominal pain, skin rash, fevers, chills, night sweats, weight loss, swollen lymph nodes, body aches, joint swelling, chest pain, shortness of breath, mood changes.  positive muscle aches  Objective  Blood pressure 110/74, pulse 63, height 5' 4.75" (1.645 m), weight 123 lb (55.8 kg), SpO2 99 %.   Systems examined below as of 10/03/16 General: NAD A&O x3 mood, affect normal  HEENT: Pupils equal, extraocular movements intact no nystagmus Respiratory: not short of breath at rest or with speaking Cardiovascular: No lower extremity edema, non tender Skin: Warm dry intact with no signs of infection or rash on extremities or on axial skeleton. Abdomen: Soft nontender, no masses Neuro: Cranial nerves  intact, neurovascularly intact in all extremities with 2+ DTRs and 2+ pulses. Lymph: No lymphadenopathy appreciated today  Gait normal with good balance and coordination.  MSK: Non tender with full range of motion and good stability and symmetric strength and tone of shoulders, elbows, wrist,  knee and ankles bilaterally.    RCV:ELFY ROM IR: 45 Deg, ER: 35 Deg, Flexion: 120 Deg, Extension: 100 Deg, Abduction: 45 Deg, Adduction: 25 Deg Strength IR: 5/5, ER: 5/5, Flexion: 5/5, Extension: 5/5, Abduction: 5/5, Adduction: 5/5 Pelvic alignment unremarkable to inspection and palpation. Standing hip rotation and gait without trendelenburg sign / unsteadiness. Greater trochanter without tenderness to palpation. No tenderness over piriformis and greater trochanter. Till mild tightness with Corky Sox No SI joint tenderness and normal minimal SI movement..  Osteopathic findings C6  flexed rotated and side bent left T3 extended rotated and side bent right inhaled third rib T10 extended rotated and side bent left L3 flexed rotated and side bent right Sacrum right on right    Impression and Recommendations:     This case required medical decision making of moderate complexity.      Note: This dictation was prepared with Dragon dictation along with smaller phrase technology. Any transcriptional errors that result from this process are unintentional.

## 2016-11-30 NOTE — Progress Notes (Signed)
[name] [place], [place] [address] Phone: [phone] Subjective:    I'm seeing this patient by the request  of:  Josetta Huddle, MD   CC: Right hip pain right shoulder pain f/uLow back and neck pain follow-up  [id number]  Jennifer [name] is a 71 y.o. female coming in with complaint of right hip pain. Patient sent have more of a bursitis as well as a right-sided gluteal tendinitis. Patient was given gluteal tendon injection But continues to do well. Does have some neck and lower back pain though. Has responded fairly well to manipulation previously. Patient is having some mild increasing in discomfort.  Patient is also having bilateral foot pain. Has had pain previously. Has tried multiple different things such as shoes and over-the-counter orthotics with very mild improvement.      Past Medical History:  Diagnosis Date  . Arthritis   . DJD of shoulder   . Hyperlipidemia   . Insomnia   . Internal hemorrhoids   . Tubular adenoma of colon   . Vitamin D deficiency    Past Surgical History:  Procedure Laterality Date  . FOOT SURGERY Left   . FOOT SURGERY Right   . HIATAL HERNIA REPAIR  1954   age 3  . OOPHORECTOMY    . SHOULDER ARTHROSCOPY Right   . TOTAL ABDOMINAL HYSTERECTOMY W/ BILATERAL SALPINGOOPHORECTOMY  2007   Social History   Social History  . Marital status: Married    Spouse name: N/A  . Number of children: 2  . Years of education: N/A   Social History Main Topics  . Smoking status: Never Smoker  . Smokeless tobacco: Never Used  . Alcohol use 0.0 oz/week     Comment: 3 per week  . Drug use: No  . Sexual activity: Yes   Other Topics Concern  . None   Social History Narrative  . None   Allergies  Allergen Reactions  . Latex Other (See Comments)    Patient preference   . Penicillins     REACTION: Childhood reaction   Family History  Problem Relation Age of Onset  . Hypertension Mother   . Breast  cancer Mother   . Heart disease Mother   . Heart attack Father 52       deceased  . Prostate cancer Maternal Uncle     Past medical history, social, surgical and family history all reviewed in electronic medical record.  No pertanent information unless stated regarding to the chief complaint.   Review of Systems: No headache, visual changes, nausea, vomiting, diarrhea, constipation, dizziness, abdominal pain, skin rash, fevers, chills, night sweats, weight loss, swollen lymph nodes, body aches, joint swelling, muscle aches, chest pain, shortness of breath, mood changes.    Objective  Blood pressure 100/62, pulse 62, height 5' 4.75" (1.645 m), weight 123 lb (55.8 kg), SpO2 98 %.   Systems examined below as of 12/01/16 General: NAD A&O x3 mood, affect normal  HEENT: Pupils equal, extraocular movements intact no nystagmus Respiratory: not short of breath at rest or with speaking Cardiovascular: No lower extremity edema, non tender Skin: Warm dry intact with no signs of infection or rash on extremities or on axial skeleton. Abdomen: Soft nontender, no masses Neuro: Cranial nerves  intact, neurovascularly intact in all extremities with 2+ DTRs and 2+ pulses. Lymph: No lymphadenopathy appreciated today  Gait normal with good balance and coordination.  MSK: Non tender with full range of motion  and good stability and symmetric strength and tone of shoulders, elbows, wrist,  knee hips and ankles bilaterally.  She does have significant atrophy of the right shoulder Foot exam shows significant breakdown of the transverse arch bilaterally. Patient's right foot shows that patient does have a hammertoe  Osteopathic findings C2 flexed rotated and side bent right C4 flexed rotated and side bent left C6 flexed rotated and side bent right T3 extended rotated and side bent right  T6 extended rotated and side bent right L2 flexed rotated and side bent right Sacrum right on right    Impression and  Recommendations:     This case required medical decision making of moderate complexity.      Note: This dictation was prepared with Dragon dictation along with smaller phrase technology. Any transcriptional errors that result from this process are unintentional.

## 2016-12-01 ENCOUNTER — Ambulatory Visit (INDEPENDENT_AMBULATORY_CARE_PROVIDER_SITE_OTHER): Payer: Medicare Other | Admitting: Family Medicine

## 2016-12-01 ENCOUNTER — Encounter: Payer: Self-pay | Admitting: Family Medicine

## 2016-12-01 VITALS — BP 100/62 | HR 62 | Ht 64.75 in | Wt 123.0 lb

## 2016-12-01 DIAGNOSIS — M999 Biomechanical lesion, unspecified: Secondary | ICD-10-CM | POA: Diagnosis not present

## 2016-12-01 DIAGNOSIS — R269 Unspecified abnormalities of gait and mobility: Secondary | ICD-10-CM

## 2016-12-01 DIAGNOSIS — M2021 Hallux rigidus, right foot: Secondary | ICD-10-CM

## 2016-12-01 DIAGNOSIS — M2022 Hallux rigidus, left foot: Secondary | ICD-10-CM | POA: Diagnosis not present

## 2016-12-01 NOTE — Patient Instructions (Signed)
Good to see you  We will get you orthotics  Ice is your friend.  Keep hands within peripheral vision.  Stay active See me again in 8 weeks

## 2016-12-01 NOTE — Assessment & Plan Note (Signed)
Being fitted for custom orthotics.

## 2016-12-06 ENCOUNTER — Encounter: Payer: Self-pay | Admitting: Family Medicine

## 2016-12-06 ENCOUNTER — Ambulatory Visit (INDEPENDENT_AMBULATORY_CARE_PROVIDER_SITE_OTHER): Payer: Medicare Other | Admitting: Family Medicine

## 2016-12-06 DIAGNOSIS — R269 Unspecified abnormalities of gait and mobility: Secondary | ICD-10-CM

## 2016-12-06 NOTE — Assessment & Plan Note (Signed)
Orthotics made today. Patient will slowly increase wear over the course the next several weeks. We discussed any worsening symptoms we need to consider some adjustments. Patient come back and see me again in 4 weeks. Continue all other conservative therapy

## 2016-12-06 NOTE — Progress Notes (Signed)
Procedure Note   Patient was fitted for a : Igli Comfort, standard, cushioned, semi-rigid orthotic. The orthotic was heated and afterward the patient patient seated position and molded The patient was positioned in subtalar neutral position and 10 degrees of ankle dorsiflexion in a weight bearing stance. After completion of molding, patient did have orthotic management The blank was ground to a stable position for weight bearing. Size: Women's 8 Base: Carbon fiber Additional Posting and Padding: Medial longitudinal arch: Bilateral: 270/90x2 Transverse arch: Bilateral: 250/35 The patient ambulated these, and they were very comfortable.

## 2017-02-06 ENCOUNTER — Encounter: Payer: Self-pay | Admitting: Family Medicine

## 2017-02-06 ENCOUNTER — Ambulatory Visit (INDEPENDENT_AMBULATORY_CARE_PROVIDER_SITE_OTHER): Payer: Medicare Other | Admitting: Family Medicine

## 2017-02-06 VITALS — BP 118/72 | HR 64 | Ht 65.0 in | Wt 126.0 lb

## 2017-02-06 DIAGNOSIS — M19011 Primary osteoarthritis, right shoulder: Secondary | ICD-10-CM

## 2017-02-06 DIAGNOSIS — M7601 Gluteal tendinitis, right hip: Secondary | ICD-10-CM | POA: Diagnosis not present

## 2017-02-06 DIAGNOSIS — M999 Biomechanical lesion, unspecified: Secondary | ICD-10-CM

## 2017-02-06 NOTE — Progress Notes (Signed)
[name] [place], [place] [address] Phone: [phone] Subjective:    I'm seeing this patient by the request  of:    CC: Follow-up for gluteal tendinitis  [id number]  Jennifer [name] is a 71 y.o. female coming in with complaint of right-sided gluteal tendinitis. Patient was given an injection in May of this year. Patient was doing significantly better at last follow-up. Attempted osteopathic manipulation as well as putting patient in custom orthotics. Patient states that she has been doing well. Stairs still bother her sometimes. Patient states some and overall is not as bad as what she's had previously. Feels that the manipulation is helping.  Her shoulder is still bothering her. Patient does have rotator cuff arthropathy with significant atrophy.    Past Medical History:  Diagnosis Date  . Arthritis   . DJD of shoulder   . Hyperlipidemia   . Insomnia   . Internal hemorrhoids   . Tubular adenoma of colon   . Vitamin D deficiency    Past Surgical History:  Procedure Laterality Date  . FOOT SURGERY Left   . FOOT SURGERY Right   . HIATAL HERNIA REPAIR  1954   age 89  . OOPHORECTOMY    . SHOULDER ARTHROSCOPY Right   . TOTAL ABDOMINAL HYSTERECTOMY W/ BILATERAL SALPINGOOPHORECTOMY  2007   Social History   Social History  . Marital status: Married    Spouse name: N/A  . Number of children: 2  . Years of education: N/A   Social History Main Topics  . Smoking status: Never Smoker  . Smokeless tobacco: Never Used  . Alcohol use 0.0 oz/week     Comment: 3 per week  . Drug use: No  . Sexual activity: Yes   Other Topics Concern  . None   Social History Narrative  . None   Allergies  Allergen Reactions  . Latex Other (See Comments)    Patient preference   . Penicillins     REACTION: Childhood reaction   Family History  Problem Relation Age of Onset  . Hypertension Mother   . Breast cancer Mother   . Heart disease  Mother   . Heart attack Father 42       deceased  . Prostate cancer Maternal Uncle      Past medical history, social, surgical and family history all reviewed in electronic medical record.  No pertanent information unless stated regarding to the chief complaint.   Review of Systems:Review of systems updated and as accurate as of 02/06/17  No headache, visual changes, nausea, vomiting, diarrhea, constipation, dizziness, abdominal pain, skin rash, fevers, chills, night sweats, weight loss, swollen lymph nodes, body aches, joint swelling, chest pain, shortness of breath, mood changes. Positive muscle aches  Objective  Blood pressure 118/72, pulse 64, height 5\' 5"  (1.651 m), weight 126 lb (57.2 kg), SpO2 98 %. Systems examined below as of 02/06/17   General: No apparent distress alert and oriented x3 mood and affect normal, dressed appropriately.  HEENT: Pupils equal, extraocular movements intact  Respiratory: Patient's speak in full sentences and does not appear short of breath  Cardiovascular: No lower extremity edema, non tender, no erythema  Skin: Warm dry intact with no signs of infection or rash on extremities or on axial skeleton.  Abdomen: Soft nontender  Neuro: Cranial nerves II through XII are intact, neurovascularly intact in all extremities with 2+ DTRs and 2+ pulses.  Lymph: No lymphadenopathy of posterior or  anterior cervical chain or axillae bilaterally.  Gait normal with good balance and coordination.  MSK:  Non tender with full range of motion and good stability and symmetric strength and tone of  elbows, wrist, hip, knee and ankles bilaterally. Arthritic changes of the joints Right shoulder shows significant atrophy with crepitus. Decreasing range of motion. Neurovascular intact distally.  Neck pain. Patient does have some mild limitation in range of motion with side bending and rotation bilaterally. Negative Spurling's. Full strength lower extremities in the hands with  grip.  Osteopathic findings C2 flexed rotated and side bent right C4 flexed rotated and side bent left C7 flexed rotated and side bent left T3 extended rotated and side bent right inhaled third rib T6 extended rotated and side bent left L3 flexed rotated and side bent right Sacrum right on right     Impression and Recommendations:     This case required medical decision making of moderate complexity.      Note: This dictation was prepared with Dragon dictation along with smaller phrase technology. Any transcriptional errors that result from this process are unintentional.

## 2017-02-06 NOTE — Patient Instructions (Signed)
Great to see you  For the foot if you have pain try the pennsaid.  Ice is your friend.  Stay active.  I think the back is doing well.  Yes to the flu shot. But wait a week.  See me again in 2-3 months!

## 2017-02-06 NOTE — Assessment & Plan Note (Signed)
Decision today to treat with OMT was based on Physical Exam  After verbal consent patient was treated with HVLA, ME, FPR techniques in cervical, thoracic, lumbar and sacral areas  Patient tolerated the procedure well with improvement in symptoms  Patient given exercises, stretches and lifestyle modifications  See medications in patient instructions if given  Patient will follow up in 4 weeks 

## 2017-02-06 NOTE — Assessment & Plan Note (Signed)
Patient seems to be doing relatively well with the right shoulder. We'll continue to monitor. Attempting osteopathic manipulation and can do injections when needed. Patient was told at this time.

## 2017-02-06 NOTE — Assessment & Plan Note (Signed)
Stable. No need for another injection at this time. Attempted osteopathic manipulation previously beneficial. Likely does have some degenerative disc disease and likely contributing to some of it. Encourage her to maybe try other medications the patient was to continue with only natural approach.

## 2017-02-16 ENCOUNTER — Ambulatory Visit (INDEPENDENT_AMBULATORY_CARE_PROVIDER_SITE_OTHER): Payer: Medicare Other | Admitting: *Deleted

## 2017-02-16 DIAGNOSIS — Z23 Encounter for immunization: Secondary | ICD-10-CM | POA: Diagnosis not present

## 2017-02-17 DIAGNOSIS — H2513 Age-related nuclear cataract, bilateral: Secondary | ICD-10-CM | POA: Diagnosis not present

## 2017-02-17 DIAGNOSIS — H5203 Hypermetropia, bilateral: Secondary | ICD-10-CM | POA: Diagnosis not present

## 2017-03-01 ENCOUNTER — Other Ambulatory Visit: Payer: Self-pay | Admitting: Internal Medicine

## 2017-03-01 DIAGNOSIS — Z1231 Encounter for screening mammogram for malignant neoplasm of breast: Secondary | ICD-10-CM

## 2017-03-15 DIAGNOSIS — D485 Neoplasm of uncertain behavior of skin: Secondary | ICD-10-CM | POA: Diagnosis not present

## 2017-03-15 DIAGNOSIS — D225 Melanocytic nevi of trunk: Secondary | ICD-10-CM | POA: Diagnosis not present

## 2017-03-15 DIAGNOSIS — L570 Actinic keratosis: Secondary | ICD-10-CM | POA: Diagnosis not present

## 2017-03-15 DIAGNOSIS — L708 Other acne: Secondary | ICD-10-CM | POA: Diagnosis not present

## 2017-03-15 DIAGNOSIS — D2272 Melanocytic nevi of left lower limb, including hip: Secondary | ICD-10-CM | POA: Diagnosis not present

## 2017-03-15 DIAGNOSIS — Z23 Encounter for immunization: Secondary | ICD-10-CM | POA: Diagnosis not present

## 2017-03-15 DIAGNOSIS — D1800 Hemangioma unspecified site: Secondary | ICD-10-CM | POA: Diagnosis not present

## 2017-03-15 DIAGNOSIS — L821 Other seborrheic keratosis: Secondary | ICD-10-CM | POA: Diagnosis not present

## 2017-03-15 DIAGNOSIS — L814 Other melanin hyperpigmentation: Secondary | ICD-10-CM | POA: Diagnosis not present

## 2017-03-30 ENCOUNTER — Ambulatory Visit
Admission: RE | Admit: 2017-03-30 | Discharge: 2017-03-30 | Disposition: A | Discharge status: Home / Self Care | Payer: Medicare Other | Source: Ambulatory Visit | Attending: Internal Medicine | Admitting: Internal Medicine

## 2017-03-30 DIAGNOSIS — Z1231 Encounter for screening mammogram for malignant neoplasm of breast: Secondary | ICD-10-CM | POA: Diagnosis not present

## 2017-04-13 ENCOUNTER — Ambulatory Visit: Payer: Medicare Other | Admitting: Family Medicine

## 2017-04-24 NOTE — Progress Notes (Signed)
[name] [place], [place] [address] Phone: [phone] Subjective:    I'm seeing this patient by the request  of:    CC: Back and buttock pain  CXK:[id number]  Jennifer [name] is a 72 y.o. female coming in with complaint of back and buttocks pain.  We have attempted osteopathic manipulation.  Has known degenerative disc disease.  Has been doing very well with conservative therapy.  Patient is also responded fairly well to the gluteal tendon injections previously.  Patient has had custom orthotics.  Patient states foot seems to be doing a little bit better.  Did find different shoes that seems to be helpful.  Also found to have more of a right rotator cuff arthropathy.  Seems to be doing relatively well.  Still has limited range of motion and does have pain daily.  Does not want to do anything more aggressive.  Patient continues to have the back and gluteal pain but seems to be stable.  Has responded fairly well to osteopathic manipulation.  Patient feels that as long as she does the exercises seems to be doing all right.      Past Medical History:  Diagnosis Date  . Arthritis   . DJD of shoulder   . Hyperlipidemia   . Insomnia   . Internal hemorrhoids   . Tubular adenoma of colon   . Vitamin D deficiency    Past Surgical History:  Procedure Laterality Date  . FOOT SURGERY Left   . FOOT SURGERY Right   . HIATAL HERNIA REPAIR  1954   age 50  . OOPHORECTOMY    . SHOULDER ARTHROSCOPY Right   . TOTAL ABDOMINAL HYSTERECTOMY W/ BILATERAL SALPINGOOPHORECTOMY  2007   Social History   Socioeconomic History  . Marital status: Married    Spouse name: Not on file  . Number of children: 2  . Years of education: Not on file  . Highest education level: Not on file  Social Needs  . Financial resource strain: Not on file  . Food insecurity - worry: Not on file  . Food insecurity - inability: Not on file  . Transportation needs - medical: Not  on file  . Transportation needs - non-medical: Not on file  Occupational History  . Not on file  Tobacco Use  . Smoking status: Never Smoker  . Smokeless tobacco: Never Used  Substance and Sexual Activity  . Alcohol use: Yes    Alcohol/week: 0.0 oz    Comment: 3 per week  . Drug use: No  . Sexual activity: Yes  Other Topics Concern  . Not on file  Social History Narrative  . Not on file   Allergies  Allergen Reactions  . Latex Other (See Comments)    Patient preference   . Penicillins     REACTION: Childhood reaction   Family History  Problem Relation Age of Onset  . Hypertension Mother   . Breast cancer Mother   . Heart disease Mother   . Heart attack Father 21       deceased  . Prostate cancer Maternal Uncle      Past medical history, social, surgical and family history all reviewed in electronic medical record.  No pertanent information unless stated regarding to the chief complaint.   Review of Systems:Review of systems updated and as accurate as of 04/24/17  No headache, visual changes, nausea, vomiting, diarrhea, constipation, dizziness, abdominal pain, skin rash, fevers, chills, night sweats,  weight loss, swollen lymph nodes, body aches, joint swelling, muscle aches, chest pain, shortness of breath, mood changes.   Objective  There were no vitals taken for this visit. Systems examined below as of 04/24/17   General: No apparent distress alert and oriented x3 mood and affect normal, dressed appropriately.  HEENT: Pupils equal, extraocular movements intact  Respiratory: Patient's speak in full sentences and does not appear short of breath  Cardiovascular: No lower extremity edema, non tender, no erythema  Skin: Warm dry intact with no signs of infection or rash on extremities or on axial skeleton.  Abdomen: Soft nontender  Neuro: Cranial nerves II through XII are intact, neurovascularly intact in all extremities with 2+ DTRs and 2+ pulses.  Lymph: No  lymphadenopathy of posterior or anterior cervical chain or axillae bilaterally.  Gait normal with good balance and coordination.  MSK:  Non tender with full range of motion and good stability and symmetric strength and tone of shoulders, elbows, wrist, hip, knee and ankles bilaterally.  Arthritic changes of multiple joints significant arthritis with decreased range of motion of the right shoulder  Back Exam:  Inspection: Lordosis of the lumbar spine moderately decreased as well as some degenerative scoliosis Motion: Flexion 35 deg, Extension 25 deg, Side Bending to 35 deg bilaterally,  Rotation to 35 deg bilaterally  SLR laying: Negative  XSLR laying: Negative  Palpable tenderness: Tender to palpation in the paraspinal musculature lumbar spine right greater than left. FABER: Positive right. Sensory change: Gross sensation intact to all lumbar and sacral dermatomes.  Reflexes: 2+ at both patellar tendons, 2+ at achilles tendons, Babinski's downgoing.  Strength at foot  Plantar-flexion: 5/5 Dorsi-flexion: 5/5 Eversion: 5/5 Inversion: 5/5  Leg strength  Quad: 5/5 Hamstring: 5/5 Hip flexor: 5/5 Hip abductors: 5/5  Gait unremarkable.  Osteopathic findings C7 flexed rotated and side bent left T3 extended rotated and side bent right inhaled third rib T5 extended rotated and side bent left L2 flexed rotated and side bent right Sacrum right on right     Impression and Recommendations:     This case required medical decision making of moderate complexity.      Note: This dictation was prepared with Dragon dictation along with smaller phrase technology. Any transcriptional errors that result from this process are unintentional.

## 2017-04-25 ENCOUNTER — Encounter: Payer: Self-pay | Admitting: Family Medicine

## 2017-04-25 ENCOUNTER — Ambulatory Visit (INDEPENDENT_AMBULATORY_CARE_PROVIDER_SITE_OTHER): Payer: Medicare Other | Admitting: Family Medicine

## 2017-04-25 VITALS — BP 100/70 | HR 65 | Ht 65.0 in | Wt 126.0 lb

## 2017-04-25 DIAGNOSIS — M7601 Gluteal tendinitis, right hip: Secondary | ICD-10-CM

## 2017-04-25 DIAGNOSIS — M999 Biomechanical lesion, unspecified: Secondary | ICD-10-CM | POA: Diagnosis not present

## 2017-04-25 NOTE — Assessment & Plan Note (Signed)
Decision today to treat with OMT was based on Physical Exam  After verbal consent patient was treated with HVLA, ME, FPR techniques in cervical, thoracic, lumbar and sacral areas  Patient tolerated the procedure well with improvement in symptoms  Patient given exercises, stretches and lifestyle modifications  See medications in patient instructions if given  Patient will follow up in 4 weeks 

## 2017-04-25 NOTE — Assessment & Plan Note (Signed)
Stable overall.  We discussed icing regimen and home exercises.  Therapy.  See me every 4-8 weeks for further evaluation osteopathic manipulation.

## 2017-04-25 NOTE — Patient Instructions (Signed)
Good to see you  Jennifer [name] is your friend.  Keep it up  I think swimming is fine Shoes the [employer] 2 See me again in 6-8 weeks

## 2017-05-16 ENCOUNTER — Ambulatory Visit (HOSPITAL_COMMUNITY)
Admission: EM | Admit: 2017-05-16 | Discharge: 2017-05-16 | Disposition: A | Discharge status: Home / Self Care | Payer: Medicare Other | Attending: Family Medicine | Admitting: Family Medicine

## 2017-05-16 ENCOUNTER — Encounter (HOSPITAL_COMMUNITY): Payer: Self-pay | Admitting: Emergency Medicine

## 2017-05-16 DIAGNOSIS — L03213 Periorbital cellulitis: Secondary | ICD-10-CM

## 2017-05-16 MED ORDER — SULFAMETHOXAZOLE-TRIMETHOPRIM 800-160 MG PO TABS: 1.0000 | ORAL_TABLET | Freq: Two times a day (BID) | ORAL | 0 refills | Status: AC

## 2017-05-16 NOTE — ED Provider Notes (Signed)
Jennifer [name]    CSN: [id number] Arrival date & time: 05/16/17  1416     History   Chief Complaint Chief Complaint  Patient presents with  . Eye Pain    HPI Jennifer [name] is a 72 y.o. female.   Jennifer [name] presents with complaints of left upper eye lid redness, pain, swelling and itching which started last night. Denies vision change or pain with eye movement. She states the lid generally feels a pressure. No known fevers. Without discharge. Causing mild headache. Also with URI symptoms which have been persistent for the past few weeks. Mild PND and cough. Has not taken any medications for this. Without shortness of breath, fevers, ear pain or sore throat. States has a history of allergies but has not taken anything for this. Wears glasses, does not wear contacts.    ROS per HPI.       Past Medical History:  Diagnosis Date  . Arthritis   . DJD of shoulder   . Hyperlipidemia   . Insomnia   . Internal hemorrhoids   . Tubular adenoma of colon   . Vitamin D deficiency     Patient Active Problem List   Diagnosis Date Noted  . Abnormality of gait 12/01/2016  . Nonallopathic lesion of thoracic region 09/02/2016  . Nonallopathic lesion of lumbosacral region 09/02/2016  . Nonallopathic lesion of sacral region 09/02/2016  . Gluteal tendonitis of right buttock 06/28/2016  . Hallux rigidus of both feet 01/12/2016  . Chronic tonsillar hypertrophy 06/16/2010  . OSTEOPENIA 05/07/2010  . Degenerative joint disease of right shoulder 12/29/2009  . KNEE PAIN 12/29/2009  . ALLERGIC RHINITIS CAUSE UNSPECIFIED 09/04/2009  . CONSTIPATION, INTERMITTENT 09/04/2009  . MEMORY LOSS 09/04/2009  . INSOMNIA 04/25/2008  . HYPERLIPIDEMIA 12/11/2007  . SYMPTOMATIC MENOPAUSAL/FEMALE CLIMACTERIC STATES 12/11/2007  . 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 LIVER FUNCTION TESTS, ABNORMAL 04/06/2007    Past Surgical History:  Procedure Laterality Date  . FOOT SURGERY Left   . FOOT  SURGERY Right   . HIATAL HERNIA REPAIR  1954   age 67  . OOPHORECTOMY    . SHOULDER ARTHROSCOPY Right   . TOTAL ABDOMINAL HYSTERECTOMY W/ BILATERAL SALPINGOOPHORECTOMY  2007    OB History    No data available       Home Medications    Prior to Admission medications   Medication Sig Start Date End Date Taking? Authorizing Provider  Ascorbic Acid (VITAMIN C) 100 MG tablet Take 100 mg by mouth 2 (two) times a week.    Yes [provider]  b complex vitamins tablet Take 1 tablet by mouth 2 (two) times a week.    Yes [provider]  cholecalciferol (VITAMIN D) 1000 units tablet Take 1,000 Units by mouth daily.   Yes [provider]  magnesium oxide (MAG-OX) 400 MG tablet Take 400 mg by mouth daily.    Yes [provider]  omega-3 acid ethyl esters (LOVAZA) 1 G capsule Take 1 g by mouth 2 (two) times a week.    Yes [provider]  sulfamethoxazole-trimethoprim (BACTRIM DS,SEPTRA DS) 800-160 MG tablet Take 1 tablet by mouth 2 (two) times daily for 10 days. 05/16/17 05/26/17  Zigmund Gottron, NP    Family History Family History  Problem Relation Age of Onset  . Hypertension Mother   . Breast cancer Mother   . Heart disease Mother   . Heart attack Father 53       deceased  .  Prostate cancer Maternal Uncle     Social History Social History   Tobacco Use  . Smoking status: Never Smoker  . Smokeless tobacco: Never Used  Substance Use Topics  . Alcohol use: Yes    Alcohol/week: 0.0 oz    Comment: 3 per week  . Drug use: No     Allergies   Latex and Penicillins   Review of Systems Review of Systems   Physical Exam Triage Vital Signs ED Triage Vitals  Enc Vitals Group     BP 05/16/17 1449 125/61     Pulse Rate 05/16/17 1449 64     Resp 05/16/17 1449 16     Temp 05/16/17 1449 98.3 F (36.8 C)     Temp Source 05/16/17 1449 Oral     SpO2 05/16/17 1449 100 %     Weight 05/16/17 1448 122 lb (55.3 kg)     Height 05/16/17  1448 5\' 5"  (1.651 m)     Head Circumference --      Peak Flow --      Pain Score 05/16/17 1448 5     Pain Loc --      Pain Edu? --      Excl. in [place]? --    No data found.  Updated Vital Signs BP 125/61   Pulse 64   Temp 98.3 F (36.8 C) (Oral)   Resp 16   Ht 5\' 5"  (1.651 m)   Wt 122 lb (55.3 kg)   SpO2 100%   BMI 20.30 kg/m   Visual Acuity Right Eye Distance: 20/30 Left Eye Distance: 20/30 Bilateral Distance: 20/25  Right Eye Near:   Left Eye Near:    Bilateral Near:     Physical Exam  Constitutional: She is oriented to person, place, and time. She appears well-developed and well-nourished. No distress.  HENT:  Head: Normocephalic and atraumatic.  Right Ear: Tympanic membrane, external ear and ear canal normal.  Left Ear: Tympanic membrane, external ear and ear canal normal.  Nose: Nose normal.  Mouth/Throat: Uvula is midline, oropharynx is clear and moist and mucous membranes are normal. No tonsillar exudate.  Eyes: Conjunctivae and EOM are normal. Pupils are equal, round, and reactive to light.  Left lid with redness and swelling; without discharge; area of greater more localized swelling at midline, without visible stye; no obvious proptosis  Cardiovascular: Normal rate, regular rhythm and normal heart sounds.  Pulmonary/Chest: Effort normal and breath sounds normal.  Lymphadenopathy:    She has cervical adenopathy.  Neurological: She is alert and oriented to person, place, and time.  Skin: Skin is warm and dry.     UC Treatments / Results  Labs (all labs ordered are listed, but only abnormal results are displayed) Labs Reviewed - No data to display  EKG  EKG Interpretation None       Radiology No results found.  Procedures Procedures (including critical care time)  Medications Ordered in UC Medications - No data to display   Initial Impression / Assessment and Plan / UC Course  I have reviewed the triage vital signs and the nursing  notes.  Pertinent labs & imaging results that were available during my care of the patient were reviewed by me and considered in my medical decision making (see chart for details).     Blepharitis vs internal hordeolum vs cellulitis related to URI symptoms. Penicillin allergy. Bactrim initiated.  Patient has appointment with eye doctor tomorrow. Vision screen WNL at this time.  Without significant pain, non toxic in appearance, afebrile. Return precautions provided. Patient verbalized understanding and agreeable to plan.    Final Clinical Impressions(s) / UC Diagnoses   Final diagnoses:  Preseptal cellulitis of left upper eyelid    ED Discharge Orders        Ordered    sulfamethoxazole-trimethoprim (BACTRIM DS,SEPTRA DS) 800-160 MG tablet  2 times daily     05/16/17 1540       Controlled Substance Prescriptions [place] Controlled Substance Registry consulted? Not Applicable   Zigmund Gottron, NP 05/16/17 1554

## 2017-05-16 NOTE — Discharge Instructions (Signed)
Complete course of antibiotics. May use zyrtec (cetirizine) for decongestant especially with allergies. Drink plenty of fluids. Warm compresses to the eye approximately 3 times a day. Please continue to follow with your eye doctor for reevaluation. If develop increased pain, fevers, change or loss of vision, eye ball pain, please go to Er.

## 2017-05-16 NOTE — ED Triage Notes (Signed)
PT reports left eye swelling, pain, redness and itching that started yesterday.   PT reports no visual disturbances.

## 2017-05-17 DIAGNOSIS — H0014 Chalazion left upper eyelid: Secondary | ICD-10-CM | POA: Diagnosis not present

## 2017-06-12 NOTE — Progress Notes (Signed)
[name] [place], [place] [address] Phone: [phone] Subjective:    I'm seeing this patient by the request  of:    CC: Back pain follow-up  [id number]  MALKIE WILLE is a 72 y.o. female coming in with complaint of back pain.  Found to have more of a gluteal tendinitis.  Has responded fairly well to osteopathic manipulation.  Patient states that she is doing relatively well still has pain in the right shoulder.  Responded well to the osteopathic manipulation.  Will be traveling to week and knows it will be something that can exacerbate her back pain.     Past Medical History:  Diagnosis Date  . Arthritis   . DJD of shoulder   . Hyperlipidemia   . Insomnia   . Internal hemorrhoids   . Tubular adenoma of colon   . Vitamin D deficiency    Past Surgical History:  Procedure Laterality Date  . FOOT SURGERY Left   . FOOT SURGERY Right   . HIATAL HERNIA REPAIR  1954   age 57  . OOPHORECTOMY    . SHOULDER ARTHROSCOPY Right   . TOTAL ABDOMINAL HYSTERECTOMY W/ BILATERAL SALPINGOOPHORECTOMY  2007   Social History   Socioeconomic History  . Marital status: Married    Spouse name: Not on file  . Number of children: 2  . Years of education: Not on file  . Highest education level: Not on file  Social Needs  . Financial resource strain: Not on file  . Food insecurity - worry: Not on file  . Food insecurity - inability: Not on file  . Transportation needs - medical: Not on file  . Transportation needs - non-medical: Not on file  Occupational History  . Not on file  Tobacco Use  . Smoking status: Never Smoker  . Smokeless tobacco: Never Used  Substance and Sexual Activity  . Alcohol use: Yes    Alcohol/week: 0.0 oz    Comment: 3 per week  . Drug use: No  . Sexual activity: Yes  Other Topics Concern  . Not on file  Social History Narrative  . Not on file   Allergies  Allergen Reactions  . Latex Other (See Comments)   Patient preference   . Penicillins     REACTION: Childhood reaction   Family History  Problem Relation Age of Onset  . Hypertension Mother   . Breast cancer Mother   . Heart disease Mother   . Heart attack Father 36       deceased  . Prostate cancer Maternal Uncle      Past medical history, social, surgical and family history all reviewed in electronic medical record.  No pertanent information unless stated regarding to the chief complaint.   Review of Systems:Review of systems updated and as accurate as of 06/12/17  No headache, visual changes, nausea, vomiting, diarrhea, constipation, dizziness, abdominal pain, skin rash, fevers, chills, night sweats, weight loss, swollen lymph nodes, body aches, joint swelling, muscle aches, chest pain, shortness of breath, mood changes.   Objective  There were no vitals taken for this visit. Systems examined below as of 06/12/17   General: No apparent distress alert and oriented x3 mood and affect normal, dressed appropriately.  HEENT: Pupils equal, extraocular movements intact  Respiratory: Patient's speak in full sentences and does not appear short of breath  Cardiovascular: No lower extremity edema, non tender, no erythema  Skin: Warm dry intact with no  signs of infection or rash on extremities or on axial skeleton.  Abdomen: Soft nontender  Neuro: Cranial nerves II through XII are intact, neurovascularly intact in all extremities with 2+ DTRs and 2+ pulses.  Lymph: No lymphadenopathy of posterior or anterior cervical chain or axillae bilaterally.  Gait normal with good balance and coordination.  MSK:  Non tender with full range of motion and good stability and symmetric strength and tone of shoulders, elbows, wrist, hip, knee and ankles bilaterally.  Back exam shows some degenerative scoliosis noted.  Tenderness to palpation in the thoracolumbar and lumbosacral areas.  Limited range of motion of mild increase in kyphosis of the upper back.   Strength is symmetric and full in all extremities.  Osteopathic findings  T3 extended rotated and side bent right inhaled third rib T6 extended rotated and side bent left L3 flexed rotated and side bent right Sacrum right on right     Impression and Recommendations:     This case required medical decision making of moderate complexity.      Note: This dictation was prepared with Dragon dictation along with smaller phrase technology. Any transcriptional errors that result from this process are unintentional.

## 2017-06-13 ENCOUNTER — Encounter: Payer: Self-pay | Admitting: Family Medicine

## 2017-06-13 ENCOUNTER — Ambulatory Visit (INDEPENDENT_AMBULATORY_CARE_PROVIDER_SITE_OTHER): Payer: Medicare Other | Admitting: Family Medicine

## 2017-06-13 VITALS — BP 100/70 | HR 74 | Ht 65.0 in | Wt 126.0 lb

## 2017-06-13 DIAGNOSIS — M7601 Gluteal tendinitis, right hip: Secondary | ICD-10-CM | POA: Diagnosis not present

## 2017-06-13 DIAGNOSIS — M999 Biomechanical lesion, unspecified: Secondary | ICD-10-CM

## 2017-06-13 NOTE — Assessment & Plan Note (Signed)
Decision today to treat with OMT was based on Physical Exam  After verbal consent patient was treated with HVLA, ME, FPR techniques in  thoracic, lumbar and sacral areas  Patient tolerated the procedure well with improvement in symptoms  Patient given exercises, stretches and lifestyle modifications  See medications in patient instructions if given  Patient will follow up in 4-6 weeks 

## 2017-06-13 NOTE — Patient Instructions (Addendum)
Good to see you  Jennifer [name] is your friend.  Stay active.  You are doing great  Try the new change in the orthotic. If still hurts then take it off.  Keep up with the exercises  Have a great trip  See me again in 2 months!

## 2017-06-13 NOTE — Assessment & Plan Note (Signed)
Patient has done relatively well with conservative therapy.  I do believe that there is degenerative disc disease and likely some spinal stenosis that is contributing to the back pain.  Has responded well to osteopathic manipulation O.  Discussed icing regimen and home exercises.  Discussed core strengthening N.  Patient will be having some traveling coming up and likely will not be doing the exercises as much.  Follow-up again in 4-6 weeks

## 2017-07-10 DIAGNOSIS — Z23 Encounter for immunization: Secondary | ICD-10-CM | POA: Diagnosis not present

## 2017-07-10 DIAGNOSIS — L820 Inflamed seborrheic keratosis: Secondary | ICD-10-CM | POA: Diagnosis not present

## 2017-07-19 DIAGNOSIS — M9901 Segmental and somatic dysfunction of cervical region: Secondary | ICD-10-CM | POA: Diagnosis not present

## 2017-07-19 DIAGNOSIS — M9902 Segmental and somatic dysfunction of thoracic region: Secondary | ICD-10-CM | POA: Diagnosis not present

## 2017-07-19 DIAGNOSIS — M47892 Other spondylosis, cervical region: Secondary | ICD-10-CM | POA: Diagnosis not present

## 2017-07-19 DIAGNOSIS — M4604 Spinal enthesopathy, thoracic region: Secondary | ICD-10-CM | POA: Diagnosis not present

## 2017-07-21 DIAGNOSIS — M4604 Spinal enthesopathy, thoracic region: Secondary | ICD-10-CM | POA: Diagnosis not present

## 2017-07-21 DIAGNOSIS — M9901 Segmental and somatic dysfunction of cervical region: Secondary | ICD-10-CM | POA: Diagnosis not present

## 2017-07-21 DIAGNOSIS — M47892 Other spondylosis, cervical region: Secondary | ICD-10-CM | POA: Diagnosis not present

## 2017-07-21 DIAGNOSIS — M9902 Segmental and somatic dysfunction of thoracic region: Secondary | ICD-10-CM | POA: Diagnosis not present

## 2017-07-24 DIAGNOSIS — M9902 Segmental and somatic dysfunction of thoracic region: Secondary | ICD-10-CM | POA: Diagnosis not present

## 2017-07-24 DIAGNOSIS — M4604 Spinal enthesopathy, thoracic region: Secondary | ICD-10-CM | POA: Diagnosis not present

## 2017-07-24 DIAGNOSIS — M47892 Other spondylosis, cervical region: Secondary | ICD-10-CM | POA: Diagnosis not present

## 2017-07-24 DIAGNOSIS — M9901 Segmental and somatic dysfunction of cervical region: Secondary | ICD-10-CM | POA: Diagnosis not present

## 2017-07-27 DIAGNOSIS — M4604 Spinal enthesopathy, thoracic region: Secondary | ICD-10-CM | POA: Diagnosis not present

## 2017-07-27 DIAGNOSIS — M9902 Segmental and somatic dysfunction of thoracic region: Secondary | ICD-10-CM | POA: Diagnosis not present

## 2017-07-27 DIAGNOSIS — M47892 Other spondylosis, cervical region: Secondary | ICD-10-CM | POA: Diagnosis not present

## 2017-07-27 DIAGNOSIS — M9901 Segmental and somatic dysfunction of cervical region: Secondary | ICD-10-CM | POA: Diagnosis not present

## 2017-08-02 DIAGNOSIS — M9901 Segmental and somatic dysfunction of cervical region: Secondary | ICD-10-CM | POA: Diagnosis not present

## 2017-08-02 DIAGNOSIS — M47892 Other spondylosis, cervical region: Secondary | ICD-10-CM | POA: Diagnosis not present

## 2017-08-02 DIAGNOSIS — M9902 Segmental and somatic dysfunction of thoracic region: Secondary | ICD-10-CM | POA: Diagnosis not present

## 2017-08-02 DIAGNOSIS — M4604 Spinal enthesopathy, thoracic region: Secondary | ICD-10-CM | POA: Diagnosis not present

## 2017-08-09 DIAGNOSIS — M9902 Segmental and somatic dysfunction of thoracic region: Secondary | ICD-10-CM | POA: Diagnosis not present

## 2017-08-09 DIAGNOSIS — M4604 Spinal enthesopathy, thoracic region: Secondary | ICD-10-CM | POA: Diagnosis not present

## 2017-08-09 DIAGNOSIS — M47892 Other spondylosis, cervical region: Secondary | ICD-10-CM | POA: Diagnosis not present

## 2017-08-09 DIAGNOSIS — M9901 Segmental and somatic dysfunction of cervical region: Secondary | ICD-10-CM | POA: Diagnosis not present

## 2017-08-14 ENCOUNTER — Ambulatory Visit: Payer: Medicare Other | Admitting: Family Medicine

## 2017-08-25 DIAGNOSIS — M4604 Spinal enthesopathy, thoracic region: Secondary | ICD-10-CM | POA: Diagnosis not present

## 2017-08-25 DIAGNOSIS — M9902 Segmental and somatic dysfunction of thoracic region: Secondary | ICD-10-CM | POA: Diagnosis not present

## 2017-08-25 DIAGNOSIS — M9901 Segmental and somatic dysfunction of cervical region: Secondary | ICD-10-CM | POA: Diagnosis not present

## 2017-08-25 DIAGNOSIS — M47892 Other spondylosis, cervical region: Secondary | ICD-10-CM | POA: Diagnosis not present

## 2017-09-04 DIAGNOSIS — M9902 Segmental and somatic dysfunction of thoracic region: Secondary | ICD-10-CM | POA: Diagnosis not present

## 2017-09-04 DIAGNOSIS — M9901 Segmental and somatic dysfunction of cervical region: Secondary | ICD-10-CM | POA: Diagnosis not present

## 2017-09-04 DIAGNOSIS — M4604 Spinal enthesopathy, thoracic region: Secondary | ICD-10-CM | POA: Diagnosis not present

## 2017-09-04 DIAGNOSIS — M47892 Other spondylosis, cervical region: Secondary | ICD-10-CM | POA: Diagnosis not present

## 2017-09-13 DIAGNOSIS — M47892 Other spondylosis, cervical region: Secondary | ICD-10-CM | POA: Diagnosis not present

## 2017-09-13 DIAGNOSIS — M4604 Spinal enthesopathy, thoracic region: Secondary | ICD-10-CM | POA: Diagnosis not present

## 2017-09-13 DIAGNOSIS — M9902 Segmental and somatic dysfunction of thoracic region: Secondary | ICD-10-CM | POA: Diagnosis not present

## 2017-09-13 DIAGNOSIS — M9901 Segmental and somatic dysfunction of cervical region: Secondary | ICD-10-CM | POA: Diagnosis not present

## 2017-09-18 DIAGNOSIS — N941 Unspecified dyspareunia: Secondary | ICD-10-CM | POA: Diagnosis not present

## 2017-09-18 DIAGNOSIS — R0982 Postnasal drip: Secondary | ICD-10-CM | POA: Diagnosis not present

## 2017-09-18 DIAGNOSIS — M25519 Pain in unspecified shoulder: Secondary | ICD-10-CM | POA: Diagnosis not present

## 2017-09-18 DIAGNOSIS — E559 Vitamin D deficiency, unspecified: Secondary | ICD-10-CM | POA: Diagnosis not present

## 2017-09-18 DIAGNOSIS — G479 Sleep disorder, unspecified: Secondary | ICD-10-CM | POA: Diagnosis not present

## 2017-09-18 DIAGNOSIS — E7800 Pure hypercholesterolemia, unspecified: Secondary | ICD-10-CM | POA: Diagnosis not present

## 2017-09-18 DIAGNOSIS — Z0000 Encounter for general adult medical examination without abnormal findings: Secondary | ICD-10-CM | POA: Diagnosis not present

## 2017-09-18 DIAGNOSIS — R5383 Other fatigue: Secondary | ICD-10-CM | POA: Diagnosis not present

## 2017-09-18 DIAGNOSIS — J309 Allergic rhinitis, unspecified: Secondary | ICD-10-CM | POA: Diagnosis not present

## 2017-09-18 DIAGNOSIS — Z23 Encounter for immunization: Secondary | ICD-10-CM | POA: Diagnosis not present

## 2017-09-18 DIAGNOSIS — Z1389 Encounter for screening for other disorder: Secondary | ICD-10-CM | POA: Diagnosis not present

## 2017-09-20 DIAGNOSIS — M4604 Spinal enthesopathy, thoracic region: Secondary | ICD-10-CM | POA: Diagnosis not present

## 2017-09-20 DIAGNOSIS — M47892 Other spondylosis, cervical region: Secondary | ICD-10-CM | POA: Diagnosis not present

## 2017-09-20 DIAGNOSIS — M9901 Segmental and somatic dysfunction of cervical region: Secondary | ICD-10-CM | POA: Diagnosis not present

## 2017-09-20 DIAGNOSIS — M9902 Segmental and somatic dysfunction of thoracic region: Secondary | ICD-10-CM | POA: Diagnosis not present

## 2017-09-27 DIAGNOSIS — M47892 Other spondylosis, cervical region: Secondary | ICD-10-CM | POA: Diagnosis not present

## 2017-09-27 DIAGNOSIS — M9902 Segmental and somatic dysfunction of thoracic region: Secondary | ICD-10-CM | POA: Diagnosis not present

## 2017-09-27 DIAGNOSIS — M9901 Segmental and somatic dysfunction of cervical region: Secondary | ICD-10-CM | POA: Diagnosis not present

## 2017-09-27 DIAGNOSIS — M4604 Spinal enthesopathy, thoracic region: Secondary | ICD-10-CM | POA: Diagnosis not present

## 2017-10-04 DIAGNOSIS — M9901 Segmental and somatic dysfunction of cervical region: Secondary | ICD-10-CM | POA: Diagnosis not present

## 2017-10-04 DIAGNOSIS — M4604 Spinal enthesopathy, thoracic region: Secondary | ICD-10-CM | POA: Diagnosis not present

## 2017-10-04 DIAGNOSIS — M9902 Segmental and somatic dysfunction of thoracic region: Secondary | ICD-10-CM | POA: Diagnosis not present

## 2017-10-04 DIAGNOSIS — M47892 Other spondylosis, cervical region: Secondary | ICD-10-CM | POA: Diagnosis not present

## 2017-10-10 DIAGNOSIS — M4604 Spinal enthesopathy, thoracic region: Secondary | ICD-10-CM | POA: Diagnosis not present

## 2017-10-10 DIAGNOSIS — M47892 Other spondylosis, cervical region: Secondary | ICD-10-CM | POA: Diagnosis not present

## 2017-10-10 DIAGNOSIS — M9902 Segmental and somatic dysfunction of thoracic region: Secondary | ICD-10-CM | POA: Diagnosis not present

## 2017-10-10 DIAGNOSIS — M9901 Segmental and somatic dysfunction of cervical region: Secondary | ICD-10-CM | POA: Diagnosis not present

## 2017-10-25 DIAGNOSIS — M9901 Segmental and somatic dysfunction of cervical region: Secondary | ICD-10-CM | POA: Diagnosis not present

## 2017-10-25 DIAGNOSIS — M4604 Spinal enthesopathy, thoracic region: Secondary | ICD-10-CM | POA: Diagnosis not present

## 2017-10-25 DIAGNOSIS — M9902 Segmental and somatic dysfunction of thoracic region: Secondary | ICD-10-CM | POA: Diagnosis not present

## 2017-10-25 DIAGNOSIS — M47892 Other spondylosis, cervical region: Secondary | ICD-10-CM | POA: Diagnosis not present

## 2017-11-01 DIAGNOSIS — M9901 Segmental and somatic dysfunction of cervical region: Secondary | ICD-10-CM | POA: Diagnosis not present

## 2017-11-01 DIAGNOSIS — M47892 Other spondylosis, cervical region: Secondary | ICD-10-CM | POA: Diagnosis not present

## 2017-11-01 DIAGNOSIS — M4604 Spinal enthesopathy, thoracic region: Secondary | ICD-10-CM | POA: Diagnosis not present

## 2017-11-01 DIAGNOSIS — M9902 Segmental and somatic dysfunction of thoracic region: Secondary | ICD-10-CM | POA: Diagnosis not present

## 2017-11-13 NOTE — Progress Notes (Signed)
[name] [place],  [address] Phone: [phone] Subjective:    I'm seeing this patient by the request  of:    CC: Back pain  NGE:[id number]  Jennifer [name] is a 72 y.o. female coming in with complaint of back pain. States that she wants another hip injection today. 1 month ago she was in a lot of pain.  Has been found to have gluteal tendinitis as well as a greater trochanteric bursitis.  Patient states the back pain seems to be doing relatively well but unfortunately the hip seems to be worsening.  Rates the severity pain is 7 out of 10.  Waking her up at night.  Affecting daily activities.     Past Medical History:  Diagnosis Date  . Arthritis   . DJD of shoulder   . Hyperlipidemia   . Insomnia   . Internal hemorrhoids   . Tubular adenoma of colon   . Vitamin D deficiency    Past Surgical History:  Procedure Laterality Date  . FOOT SURGERY Left   . FOOT SURGERY Right   . HIATAL HERNIA REPAIR  1954   age 71  . OOPHORECTOMY    . SHOULDER ARTHROSCOPY Right   . TOTAL ABDOMINAL HYSTERECTOMY W/ BILATERAL SALPINGOOPHORECTOMY  2007   Social History   Socioeconomic History  . Marital status: Married    Spouse name: Not on file  . Number of children: 2  . Years of education: Not on file  . Highest education level: Not on file  Occupational History  . Not on file  Social Needs  . Financial resource strain: Not on file  . Food insecurity:    Worry: Not on file    Inability: Not on file  . Transportation needs:    Medical: Not on file    Non-medical: Not on file  Tobacco Use  . Smoking status: Never Smoker  . Smokeless tobacco: Never Used  Substance and Sexual Activity  . Alcohol use: Yes    Alcohol/week: 0.0 oz    Comment: 3 per week  . Drug use: No  . Sexual activity: Yes  Lifestyle  . Physical activity:    Days per week: Not on file    Minutes per session: Not on file  . Stress: Not on file  Relationships  .  Social connections:    Talks on phone: Not on file    Gets together: Not on file    Attends religious service: Not on file    Active member of club or organization: Not on file    Attends meetings of clubs or organizations: Not on file    Relationship status: Not on file  Other Topics Concern  . Not on file  Social History Narrative  . Not on file   Allergies  Allergen Reactions  . Latex Other (See Comments)    Patient preference   . Penicillins     REACTION: Childhood reaction   Family History  Problem Relation Age of Onset  . Hypertension Mother   . Breast cancer Mother   . Heart disease Mother   . Heart attack Father 42       deceased  . Prostate cancer Maternal Uncle      Past medical history, social, surgical and family history all reviewed in electronic medical record.  No pertanent information unless stated regarding to the chief complaint.   Review of Systems:Review of systems updated and as accurate as  of 11/15/17  No headache, visual changes, nausea, vomiting, diarrhea, constipation, dizziness, abdominal pain, skin rash, fevers, chills, night sweats, weight loss, swollen lymph nodes, body aches, joint swelling, muscle aches, chest pain, shortness of breath, mood changes.   Objective  Blood pressure 110/60, pulse 62, height 5\' 5"  (1.651 m), weight 127 lb (57.6 kg), SpO2 98 %. Systems examined below as of 11/15/17   General: No apparent distress alert and oriented x3 mood and affect normal, dressed appropriately.  HEENT: Pupils equal, extraocular movements intact  Respiratory: Patient's speak in full sentences and does not appear short of breath  Cardiovascular: No lower extremity edema, non tender, no erythema  Skin: Warm dry intact with no signs of infection or rash on extremities or on axial skeleton.  Abdomen: Soft nontender  Neuro: Cranial nerves II through XII are intact, neurovascularly intact in all extremities with 2+ DTRs and 2+ pulses.  Lymph: No  lymphadenopathy of posterior or anterior cervical chain or axillae bilaterally.  Gai antalgic gait MSK:  Non tender with full range of motion and good stability and symmetric strength and tone of shoulders, elbows, wrist, knee and ankles bilaterally.  Arthritic changes of multiple joints Right hip exam shows severe tenderness over the insertion of the gluteal tendon on the greater trochanteric area.  Mild pain over the piriformis.  Does have some pain with Corky Sox test.  Negative straight leg test.  Mild loss of internal range of motion compared to the contralateral side but less than 5 degrees.  Procedure: Real-time Ultrasound Guided Injection of right gluteal tendon sheath Device: GE Logiq Q7 Ultrasound guided injection is preferred based studies that show increased duration, increased effect, greater accuracy, decreased procedural pain, increased response rate, and decreased cost with ultrasound guided versus blind injection.  Verbal informed consent obtained.  Time-out conducted.  Noted no overlying erythema, induration, or other signs of local infection.  Skin prepped in a sterile fashion.  Local anesthesia: Topical Ethyl chloride.  With sterile technique and under real time ultrasound guidance: With a 21-gauge 2 inch needle injected with 1 cc of 0.5% Marcaine and 1 cc of Kenalog 40 mg/mL in the gluteal tendon sheath laterally. Completed without difficulty  Pain immediately resolved suggesting accurate placement of the medication.  Advised to call if fevers/chills, erythema, induration, drainage, or persistent bleeding.  Images permanently stored and available for review in the ultrasound unit.  Impression: Technically successful ultrasound guided injection.    Impression and Recommendations:     This case required medical decision making of moderate complexity.      Note: This dictation was prepared with Dragon dictation along with smaller phrase technology. Any transcriptional  errors that result from this process are unintentional.

## 2017-11-15 ENCOUNTER — Encounter: Payer: Self-pay | Admitting: Family Medicine

## 2017-11-15 ENCOUNTER — Ambulatory Visit: Payer: Self-pay

## 2017-11-15 ENCOUNTER — Ambulatory Visit (INDEPENDENT_AMBULATORY_CARE_PROVIDER_SITE_OTHER): Payer: Medicare Other | Admitting: Family Medicine

## 2017-11-15 ENCOUNTER — Encounter

## 2017-11-15 VITALS — BP 110/60 | HR 62 | Ht 65.0 in | Wt 127.0 lb

## 2017-11-15 DIAGNOSIS — M7601 Gluteal tendinitis, right hip: Secondary | ICD-10-CM | POA: Diagnosis not present

## 2017-11-15 DIAGNOSIS — M25559 Pain in unspecified hip: Secondary | ICD-10-CM

## 2017-11-15 NOTE — Assessment & Plan Note (Signed)
Repeat injection given today.  Worsening symptoms.  Discussed icing regimen and home exercises.  Discussed which activities to do which wants to avoid.  Increase activity slowly over the course the next several days.  Encourage patient to do the home exercises.  Follow-up again in 4 to 6 weeks if not completely resolved

## 2017-11-15 NOTE — Patient Instructions (Signed)
Good to see you  We did the injection again and should do well  Continue to be active.  Ice 20 minutes 2 times daily. Usually after activity and before bed. If still in pain see me again in 4 weeks otherwise see me when you need me

## 2017-11-21 DIAGNOSIS — M47892 Other spondylosis, cervical region: Secondary | ICD-10-CM | POA: Diagnosis not present

## 2017-11-21 DIAGNOSIS — M9902 Segmental and somatic dysfunction of thoracic region: Secondary | ICD-10-CM | POA: Diagnosis not present

## 2017-11-21 DIAGNOSIS — M4604 Spinal enthesopathy, thoracic region: Secondary | ICD-10-CM | POA: Diagnosis not present

## 2017-11-21 DIAGNOSIS — M9901 Segmental and somatic dysfunction of cervical region: Secondary | ICD-10-CM | POA: Diagnosis not present

## 2017-12-06 DIAGNOSIS — M47892 Other spondylosis, cervical region: Secondary | ICD-10-CM | POA: Diagnosis not present

## 2017-12-06 DIAGNOSIS — M9902 Segmental and somatic dysfunction of thoracic region: Secondary | ICD-10-CM | POA: Diagnosis not present

## 2017-12-06 DIAGNOSIS — M4604 Spinal enthesopathy, thoracic region: Secondary | ICD-10-CM | POA: Diagnosis not present

## 2017-12-06 DIAGNOSIS — M9901 Segmental and somatic dysfunction of cervical region: Secondary | ICD-10-CM | POA: Diagnosis not present

## 2017-12-08 ENCOUNTER — Ambulatory Visit: Payer: Medicare Other | Admitting: Family Medicine

## 2017-12-26 DIAGNOSIS — M9901 Segmental and somatic dysfunction of cervical region: Secondary | ICD-10-CM | POA: Diagnosis not present

## 2017-12-26 DIAGNOSIS — M4604 Spinal enthesopathy, thoracic region: Secondary | ICD-10-CM | POA: Diagnosis not present

## 2017-12-26 DIAGNOSIS — M47892 Other spondylosis, cervical region: Secondary | ICD-10-CM | POA: Diagnosis not present

## 2017-12-26 DIAGNOSIS — M9902 Segmental and somatic dysfunction of thoracic region: Secondary | ICD-10-CM | POA: Diagnosis not present

## 2018-01-17 DIAGNOSIS — M9902 Segmental and somatic dysfunction of thoracic region: Secondary | ICD-10-CM | POA: Diagnosis not present

## 2018-01-17 DIAGNOSIS — M9901 Segmental and somatic dysfunction of cervical region: Secondary | ICD-10-CM | POA: Diagnosis not present

## 2018-01-17 DIAGNOSIS — M4604 Spinal enthesopathy, thoracic region: Secondary | ICD-10-CM | POA: Diagnosis not present

## 2018-01-17 DIAGNOSIS — M47892 Other spondylosis, cervical region: Secondary | ICD-10-CM | POA: Diagnosis not present

## 2018-02-09 DIAGNOSIS — Z23 Encounter for immunization: Secondary | ICD-10-CM | POA: Diagnosis not present

## 2018-02-15 ENCOUNTER — Other Ambulatory Visit: Payer: Self-pay | Admitting: Internal Medicine

## 2018-02-15 DIAGNOSIS — Z1231 Encounter for screening mammogram for malignant neoplasm of breast: Secondary | ICD-10-CM

## 2018-02-28 DIAGNOSIS — M9901 Segmental and somatic dysfunction of cervical region: Secondary | ICD-10-CM | POA: Diagnosis not present

## 2018-02-28 DIAGNOSIS — M47892 Other spondylosis, cervical region: Secondary | ICD-10-CM | POA: Diagnosis not present

## 2018-02-28 DIAGNOSIS — M4604 Spinal enthesopathy, thoracic region: Secondary | ICD-10-CM | POA: Diagnosis not present

## 2018-02-28 DIAGNOSIS — M9902 Segmental and somatic dysfunction of thoracic region: Secondary | ICD-10-CM | POA: Diagnosis not present

## 2018-03-13 DIAGNOSIS — D225 Melanocytic nevi of trunk: Secondary | ICD-10-CM | POA: Diagnosis not present

## 2018-03-13 DIAGNOSIS — D2272 Melanocytic nevi of left lower limb, including hip: Secondary | ICD-10-CM | POA: Diagnosis not present

## 2018-03-13 DIAGNOSIS — L821 Other seborrheic keratosis: Secondary | ICD-10-CM | POA: Diagnosis not present

## 2018-03-13 DIAGNOSIS — Z23 Encounter for immunization: Secondary | ICD-10-CM | POA: Diagnosis not present

## 2018-03-13 DIAGNOSIS — L814 Other melanin hyperpigmentation: Secondary | ICD-10-CM | POA: Diagnosis not present

## 2018-03-28 DIAGNOSIS — M9901 Segmental and somatic dysfunction of cervical region: Secondary | ICD-10-CM | POA: Diagnosis not present

## 2018-03-28 DIAGNOSIS — M4604 Spinal enthesopathy, thoracic region: Secondary | ICD-10-CM | POA: Diagnosis not present

## 2018-03-28 DIAGNOSIS — M9902 Segmental and somatic dysfunction of thoracic region: Secondary | ICD-10-CM | POA: Diagnosis not present

## 2018-03-28 DIAGNOSIS — M47892 Other spondylosis, cervical region: Secondary | ICD-10-CM | POA: Diagnosis not present

## 2018-04-03 ENCOUNTER — Ambulatory Visit
Admission: RE | Admit: 2018-04-03 | Discharge: 2018-04-03 | Disposition: A | Discharge status: Home / Self Care | Payer: Medicare Other | Source: Ambulatory Visit | Attending: Internal Medicine | Admitting: Internal Medicine

## 2018-04-03 DIAGNOSIS — Z1231 Encounter for screening mammogram for malignant neoplasm of breast: Secondary | ICD-10-CM | POA: Diagnosis not present

## 2018-04-26 DIAGNOSIS — M47892 Other spondylosis, cervical region: Secondary | ICD-10-CM | POA: Diagnosis not present

## 2018-04-26 DIAGNOSIS — M9901 Segmental and somatic dysfunction of cervical region: Secondary | ICD-10-CM | POA: Diagnosis not present

## 2018-04-26 DIAGNOSIS — M9902 Segmental and somatic dysfunction of thoracic region: Secondary | ICD-10-CM | POA: Diagnosis not present

## 2018-04-26 DIAGNOSIS — M4604 Spinal enthesopathy, thoracic region: Secondary | ICD-10-CM | POA: Diagnosis not present

## 2018-05-15 DIAGNOSIS — M9901 Segmental and somatic dysfunction of cervical region: Secondary | ICD-10-CM | POA: Diagnosis not present

## 2018-05-15 DIAGNOSIS — M4604 Spinal enthesopathy, thoracic region: Secondary | ICD-10-CM | POA: Diagnosis not present

## 2018-05-15 DIAGNOSIS — M9902 Segmental and somatic dysfunction of thoracic region: Secondary | ICD-10-CM | POA: Diagnosis not present

## 2018-05-15 DIAGNOSIS — M47892 Other spondylosis, cervical region: Secondary | ICD-10-CM | POA: Diagnosis not present

## 2018-06-15 DIAGNOSIS — M47892 Other spondylosis, cervical region: Secondary | ICD-10-CM | POA: Diagnosis not present

## 2018-06-15 DIAGNOSIS — M4604 Spinal enthesopathy, thoracic region: Secondary | ICD-10-CM | POA: Diagnosis not present

## 2018-06-15 DIAGNOSIS — M9902 Segmental and somatic dysfunction of thoracic region: Secondary | ICD-10-CM | POA: Diagnosis not present

## 2018-06-15 DIAGNOSIS — M9901 Segmental and somatic dysfunction of cervical region: Secondary | ICD-10-CM | POA: Diagnosis not present

## 2018-06-22 DIAGNOSIS — M47892 Other spondylosis, cervical region: Secondary | ICD-10-CM | POA: Diagnosis not present

## 2018-06-22 DIAGNOSIS — M9901 Segmental and somatic dysfunction of cervical region: Secondary | ICD-10-CM | POA: Diagnosis not present

## 2018-06-22 DIAGNOSIS — M9902 Segmental and somatic dysfunction of thoracic region: Secondary | ICD-10-CM | POA: Diagnosis not present

## 2018-06-22 DIAGNOSIS — M4604 Spinal enthesopathy, thoracic region: Secondary | ICD-10-CM | POA: Diagnosis not present

## 2018-10-08 DIAGNOSIS — Z0001 Encounter for general adult medical examination with abnormal findings: Secondary | ICD-10-CM | POA: Diagnosis not present

## 2018-10-08 DIAGNOSIS — E559 Vitamin D deficiency, unspecified: Secondary | ICD-10-CM | POA: Diagnosis not present

## 2018-10-08 DIAGNOSIS — E7800 Pure hypercholesterolemia, unspecified: Secondary | ICD-10-CM | POA: Diagnosis not present

## 2018-10-08 DIAGNOSIS — M8580 Other specified disorders of bone density and structure, unspecified site: Secondary | ICD-10-CM | POA: Diagnosis not present

## 2018-10-08 DIAGNOSIS — Z1389 Encounter for screening for other disorder: Secondary | ICD-10-CM | POA: Diagnosis not present

## 2018-10-08 DIAGNOSIS — G479 Sleep disorder, unspecified: Secondary | ICD-10-CM | POA: Diagnosis not present

## 2018-10-08 DIAGNOSIS — N941 Unspecified dyspareunia: Secondary | ICD-10-CM | POA: Diagnosis not present

## 2018-10-08 DIAGNOSIS — J309 Allergic rhinitis, unspecified: Secondary | ICD-10-CM | POA: Diagnosis not present

## 2018-10-11 ENCOUNTER — Other Ambulatory Visit: Payer: Self-pay | Admitting: Internal Medicine

## 2018-10-11 DIAGNOSIS — Z1231 Encounter for screening mammogram for malignant neoplasm of breast: Secondary | ICD-10-CM

## 2019-01-17 DIAGNOSIS — M47892 Other spondylosis, cervical region: Secondary | ICD-10-CM | POA: Diagnosis not present

## 2019-01-17 DIAGNOSIS — M4604 Spinal enthesopathy, thoracic region: Secondary | ICD-10-CM | POA: Diagnosis not present

## 2019-01-17 DIAGNOSIS — M9902 Segmental and somatic dysfunction of thoracic region: Secondary | ICD-10-CM | POA: Diagnosis not present

## 2019-01-17 DIAGNOSIS — M9901 Segmental and somatic dysfunction of cervical region: Secondary | ICD-10-CM | POA: Diagnosis not present

## 2019-01-30 DIAGNOSIS — H2513 Age-related nuclear cataract, bilateral: Secondary | ICD-10-CM | POA: Diagnosis not present

## 2019-02-11 DIAGNOSIS — Z23 Encounter for immunization: Secondary | ICD-10-CM | POA: Diagnosis not present

## 2019-02-21 DIAGNOSIS — M9903 Segmental and somatic dysfunction of lumbar region: Secondary | ICD-10-CM | POA: Diagnosis not present

## 2019-02-21 DIAGNOSIS — M9901 Segmental and somatic dysfunction of cervical region: Secondary | ICD-10-CM | POA: Diagnosis not present

## 2019-02-21 DIAGNOSIS — M9902 Segmental and somatic dysfunction of thoracic region: Secondary | ICD-10-CM | POA: Diagnosis not present

## 2019-02-21 DIAGNOSIS — M4604 Spinal enthesopathy, thoracic region: Secondary | ICD-10-CM | POA: Diagnosis not present

## 2019-03-26 DIAGNOSIS — L821 Other seborrheic keratosis: Secondary | ICD-10-CM | POA: Diagnosis not present

## 2019-03-26 DIAGNOSIS — Z23 Encounter for immunization: Secondary | ICD-10-CM | POA: Diagnosis not present

## 2019-03-26 DIAGNOSIS — D225 Melanocytic nevi of trunk: Secondary | ICD-10-CM | POA: Diagnosis not present

## 2019-03-26 DIAGNOSIS — L814 Other melanin hyperpigmentation: Secondary | ICD-10-CM | POA: Diagnosis not present

## 2019-03-26 DIAGNOSIS — D2272 Melanocytic nevi of left lower limb, including hip: Secondary | ICD-10-CM | POA: Diagnosis not present

## 2019-04-08 ENCOUNTER — Other Ambulatory Visit: Payer: Self-pay

## 2019-04-08 ENCOUNTER — Ambulatory Visit
Admission: RE | Admit: 2019-04-08 | Discharge: 2019-04-08 | Disposition: A | Discharge status: Home / Self Care | Payer: Medicare Other | Source: Ambulatory Visit | Attending: Internal Medicine | Admitting: Internal Medicine

## 2019-04-08 DIAGNOSIS — Z1231 Encounter for screening mammogram for malignant neoplasm of breast: Secondary | ICD-10-CM

## 2019-05-15 ENCOUNTER — Ambulatory Visit: Payer: Medicare Other

## 2019-05-24 ENCOUNTER — Ambulatory Visit: Payer: Medicare Other | Attending: Internal Medicine

## 2019-05-24 DIAGNOSIS — Z23 Encounter for immunization: Secondary | ICD-10-CM | POA: Insufficient documentation

## 2019-05-24 NOTE — Progress Notes (Signed)
   Covid-19 Vaccination Clinic  Name:  Jennifer [name]    MRN: [id number] DOB: 13-Jul-1945  05/24/2019  [name] was observed post Covid-19 immunization for 15 minutes without incidence. She was provided with Vaccine Information Sheet and instruction to access the V-Safe system.   [name] was instructed to call 911 with any severe reactions post vaccine: Marland Kitchen Difficulty breathing  . Swelling of your face and throat  . A fast heartbeat  . A bad rash all over your body  . Dizziness and weakness    Immunizations Administered    Name Date Dose VIS Date Route   Pfizer COVID-19 Vaccine 05/24/2019 10:52 AM 0.3 mL 03/29/2019 Intramuscular   Manufacturer: [place]   Lot: [id number]   [place]: [id number]

## 2019-06-03 DIAGNOSIS — M9903 Segmental and somatic dysfunction of lumbar region: Secondary | ICD-10-CM | POA: Diagnosis not present

## 2019-06-03 DIAGNOSIS — M9902 Segmental and somatic dysfunction of thoracic region: Secondary | ICD-10-CM | POA: Diagnosis not present

## 2019-06-03 DIAGNOSIS — M4604 Spinal enthesopathy, thoracic region: Secondary | ICD-10-CM | POA: Diagnosis not present

## 2019-06-03 DIAGNOSIS — M9901 Segmental and somatic dysfunction of cervical region: Secondary | ICD-10-CM | POA: Diagnosis not present

## 2019-06-05 ENCOUNTER — Ambulatory Visit: Payer: Medicare Other

## 2019-06-07 DIAGNOSIS — M4604 Spinal enthesopathy, thoracic region: Secondary | ICD-10-CM | POA: Diagnosis not present

## 2019-06-07 DIAGNOSIS — M9901 Segmental and somatic dysfunction of cervical region: Secondary | ICD-10-CM | POA: Diagnosis not present

## 2019-06-07 DIAGNOSIS — M9902 Segmental and somatic dysfunction of thoracic region: Secondary | ICD-10-CM | POA: Diagnosis not present

## 2019-06-07 DIAGNOSIS — M9903 Segmental and somatic dysfunction of lumbar region: Secondary | ICD-10-CM | POA: Diagnosis not present

## 2019-06-10 ENCOUNTER — Ambulatory Visit: Payer: Medicare Other

## 2019-06-10 DIAGNOSIS — M9901 Segmental and somatic dysfunction of cervical region: Secondary | ICD-10-CM | POA: Diagnosis not present

## 2019-06-10 DIAGNOSIS — M4604 Spinal enthesopathy, thoracic region: Secondary | ICD-10-CM | POA: Diagnosis not present

## 2019-06-10 DIAGNOSIS — M9903 Segmental and somatic dysfunction of lumbar region: Secondary | ICD-10-CM | POA: Diagnosis not present

## 2019-06-10 DIAGNOSIS — M9902 Segmental and somatic dysfunction of thoracic region: Secondary | ICD-10-CM | POA: Diagnosis not present

## 2019-06-14 DIAGNOSIS — M4604 Spinal enthesopathy, thoracic region: Secondary | ICD-10-CM | POA: Diagnosis not present

## 2019-06-14 DIAGNOSIS — M9903 Segmental and somatic dysfunction of lumbar region: Secondary | ICD-10-CM | POA: Diagnosis not present

## 2019-06-14 DIAGNOSIS — M9901 Segmental and somatic dysfunction of cervical region: Secondary | ICD-10-CM | POA: Diagnosis not present

## 2019-06-14 DIAGNOSIS — M9902 Segmental and somatic dysfunction of thoracic region: Secondary | ICD-10-CM | POA: Diagnosis not present

## 2019-06-18 ENCOUNTER — Ambulatory Visit: Payer: Medicare Other | Attending: Internal Medicine

## 2019-06-18 ENCOUNTER — Ambulatory Visit (INDEPENDENT_AMBULATORY_CARE_PROVIDER_SITE_OTHER): Payer: Medicare Other | Admitting: Family Medicine

## 2019-06-18 ENCOUNTER — Other Ambulatory Visit: Payer: Self-pay

## 2019-06-18 ENCOUNTER — Encounter: Payer: Self-pay | Admitting: Family Medicine

## 2019-06-18 VITALS — BP 110/80 | HR 69 | Ht 65.0 in | Wt 120.0 lb

## 2019-06-18 DIAGNOSIS — M19011 Primary osteoarthritis, right shoulder: Secondary | ICD-10-CM

## 2019-06-18 DIAGNOSIS — Z23 Encounter for immunization: Secondary | ICD-10-CM

## 2019-06-18 DIAGNOSIS — M5136 Other intervertebral disc degeneration, lumbar region: Secondary | ICD-10-CM | POA: Diagnosis not present

## 2019-06-18 DIAGNOSIS — M999 Biomechanical lesion, unspecified: Secondary | ICD-10-CM

## 2019-06-18 NOTE — Assessment & Plan Note (Signed)
Degenerative disc disease.  Lumbar.  Chronic problem.  Likely an exacerbation causing some mild radicular symptoms into the left gluteal area.  Home exercises given today, discussed which activities to doing which wants to avoid.  Discussed posture and ergonomics, follow-up again in 4 to 8 weeks.  Patient responded fairly well to osteopathic manipulation.

## 2019-06-18 NOTE — Progress Notes (Signed)
[facility] Phone: [phone] Subjective:   Jennifer [name], am serving as a scribe for Dr. Alroy Dust Janika Jedlicka.] This visit occurred during the SARS-CoV-2 public health emergency.  Safety protocols were in place, including screening questions prior to the visit, additional usage of staff PPE, and extensive cleaning of exam room while observing appropriate contact time as indicated for disinfecting solutions.   I'm seeing this patient by the request  of:  Josetta Huddle, MD  CC: Left-sided hip pain, right shoulder pain  [id number]   11/15/2017 Repeat injection given today.  Worsening symptoms.  Discussed icing regimen and home exercises.  Discussed which activities to do which wants to avoid.  Increase activity slowly over the course the next several days.  Encourage patient to do the home exercises.  Follow-up again in 4 to 6 weeks if not completely resolved  Update 06/18/2019 Jennifer [name] is a 74 y.o. female coming in with complaint of right shoulder pain and left hip pain. Last seen for right glute pain. When she rotates to the left and lies spine her hip pain increases. Pain occurring for 3 weeks. Is able to swim without.  Patient states that she has been doing chiropractic care with mild benefits.  Chronic right shoulder pain. Is not bothering her today but wants it evaluated. Believes shoulder is connected to hip pain.  Known severe arthritic changes.  CT scan of the shoulder in 2014 showed bone-on-bone glenohumeral joint changes with cystic changes.  The patient has been doing relatively well.  Has not done any fix type of injections.    Past Medical History:  Diagnosis Date  . Arthritis   . DJD of shoulder   . Hyperlipidemia   . Insomnia   . Internal hemorrhoids   . Tubular adenoma of colon   . Vitamin D deficiency    Past Surgical History:  Procedure Laterality Date  . FOOT SURGERY Left   . FOOT  SURGERY Right   . HIATAL HERNIA REPAIR  1954   age 78  . OOPHORECTOMY    . SHOULDER ARTHROSCOPY Right   . TOTAL ABDOMINAL HYSTERECTOMY W/ BILATERAL SALPINGOOPHORECTOMY  2007   Social History   Socioeconomic History  . Marital status: Married    Spouse name: Not on file  . Number of children: 2  . Years of education: Not on file  . Highest education level: Not on file  Occupational History  . Not on file  Tobacco Use  . Smoking status: Never Smoker  . Smokeless tobacco: Never Used  Substance and Sexual Activity  . Alcohol use: Yes    Alcohol/week: 0.0 standard drinks    Comment: 3 per week  . Drug use: [name]  . Sexual activity: Yes  Other Topics Concern  . Not on file  Social History Narrative  . Not on file   Social Determinants of Health   Financial Resource Strain:   . Difficulty of Paying Living Expenses: Not on file  Food Insecurity:   . Worried About Charity fundraiser in the Last Year: Not on file  . Ran Out of Food in the Last Year: Not on file  Transportation Needs:   . Lack of Transportation (Medical): Not on file  . Lack of Transportation (Non-Medical): Not on file  Physical Activity:   . Days of Exercise per Week: Not on file  . Minutes of Exercise per Session: Not on file  Stress:   .  Feeling of Stress : Not on file  Social Connections:   . Frequency of Communication with Friends and Family: Not on file  . Frequency of Social Gatherings with Friends and Family: Not on file  . Attends Religious Services: Not on file  . Active Member of Clubs or Organizations: Not on file  . Attends Archivist Meetings: Not on file  . Marital Status: Not on file   Allergies  Allergen Reactions  . Latex Other (See Comments)    Patient preference   . Penicillins     REACTION: Childhood reaction   Family History  Problem Relation Age of Onset  . Hypertension Mother   . Breast cancer Mother   . Heart disease Mother   . Heart attack Father 65        deceased  . Prostate cancer Maternal Uncle      Current Outpatient Medications (Cardiovascular):  .  omega-3 acid ethyl esters (LOVAZA) 1 G capsule, Take 1 g by mouth 2 (two) times a week.      Current Outpatient Medications (Other):  Marland Kitchen  Ascorbic Acid (VITAMIN C) 100 MG tablet, Take 100 mg by mouth 2 (two) times a week.  Marland Kitchen  b complex vitamins tablet, Take 1 tablet by mouth 2 (two) times a week.  .  cholecalciferol (VITAMIN D) 1000 units tablet, Take 1,000 Units by mouth daily. .  magnesium oxide (MAG-OX) 400 MG tablet, Take 400 mg by mouth daily.    Reviewed prior external information including notes and imaging from  primary care provider As well as notes that were available from care everywhere and other healthcare systems.  Past medical history, social, surgical and family history all reviewed in electronic medical record.  [name] pertanent information unless stated regarding to the chief complaint.   Review of Systems:  [name] headache, visual changes, nausea, vomiting, diarrhea, constipation, dizziness, abdominal pain, skin rash, fevers, chills, night sweats, weight loss, swollen lymph nodes, body aches, joint swelling, chest pain, shortness of breath, mood changes. POSITIVE muscle aches  Objective  Blood pressure 110/80, pulse 69, height 5\' 5"  (1.651 m), weight 120 lb (54.4 kg), SpO2 99 %.   General: [name] apparent distress alert and oriented x3 mood and affect normal, dressed appropriately.  HEENT: Pupils equal, extraocular movements intact  Respiratory: Patient's speak in full sentences and does not appear short of breath  Cardiovascular: [name] lower extremity edema, non tender, [name] erythema  Skin: Warm dry intact with [name] signs of infection or rash on extremities or on axial skeleton.  Abdomen: Soft nontender  Neuro: Cranial nerves II through XII are intact, neurovascularly intact in all extremities with 2+ DTRs and 2+ pulses.  Lymph: [name] lymphadenopathy of posterior or anterior cervical  chain or axillae bilaterally.  Gait normal with good balance and coordination.  MSK: Significant arthritic changes.  Patient back exam does have some degenerative scoliosis noted.  Tender to palpation in the left gluteal area as well as the paraspinal musculature of the left.  Lumbar area.  Patient has [name] pain in the spinous process  Right shoulder exam does have atrophy and a high riding humeral head.  Patient does have crepitus, rotator cuff strength 3+ out of 5.  Decreased range of motion secondary to arthritic changes and pain in all planes of at least 5 degrees.  Osteopathic findings  T9 extended rotated and side bent left L1-L4 neutral rotated right side bent left Sacrum right on right    Impression and Recommendations:  This case required medical decision making of moderate complexity. The above documentation has been reviewed and is accurate and complete [name], DO       Note: This dictation was prepared with Dragon dictation along with smaller phrase technology. Any transcriptional errors that result from this process are unintentional.

## 2019-06-18 NOTE — Patient Instructions (Signed)
Good to see you Pennsaid small amount over painful spot 2 times daily Read about PRP Tried manipulation Exercise 3 times a week See me again in 4-6 weeks

## 2019-06-18 NOTE — Assessment & Plan Note (Signed)
Decision today to treat with OMT was based on Physical Exam  After verbal consent patient was treated with HVLA, ME, FPR techniques in  thoracic, lumbar and sacral areas  Patient tolerated the procedure well with improvement in symptoms  Patient given exercises, stretches and lifestyle modifications  See medications in patient instructions if given  Patient will follow up in 4-8 weeks 

## 2019-06-18 NOTE — Assessment & Plan Note (Signed)
Severe overall.  Patient does not want any surgical intervention, doing relatively well.  Discussed the possibility of different injections with patient has declined at the moment.  Patient will increase activity tolerated.  Follow-up again in 4 to 8 weeks chronic problem with exacerbation.

## 2019-06-18 NOTE — Progress Notes (Signed)
   Covid-19 Vaccination Clinic  Name:  Jennifer [name]    MRN: [id number] DOB: April 30, 1945  06/18/2019  [name] was observed post Covid-19 immunization for 15 minutes without incident. She was provided with Vaccine Information Sheet and instruction to access the V-Safe system.   [name] was instructed to call 911 with any severe reactions post vaccine: Marland Kitchen Difficulty breathing  . Swelling of face and throat  . A fast heartbeat  . A bad rash all over body  . Dizziness and weakness   Immunizations Administered    Name Date Dose VIS Date Route   Pfizer COVID-19 Vaccine 06/18/2019 10:35 AM 0.3 mL 03/29/2019 Intramuscular   Manufacturer: [place]   Lot: [id number]   [place]: [id number]

## 2019-06-21 ENCOUNTER — Telehealth: Payer: Self-pay | Admitting: Family Medicine

## 2019-06-21 ENCOUNTER — Other Ambulatory Visit: Payer: Self-pay

## 2019-06-21 DIAGNOSIS — M7601 Gluteal tendinitis, right hip: Secondary | ICD-10-CM

## 2019-06-21 DIAGNOSIS — M19011 Primary osteoarthritis, right shoulder: Secondary | ICD-10-CM

## 2019-06-21 NOTE — Telephone Encounter (Signed)
Sent patient MyChart message to confirm.

## 2019-06-21 NOTE — Progress Notes (Unsigned)
amb  

## 2019-06-21 NOTE — Telephone Encounter (Signed)
Pt is going to see Dr. Rozanna Boer Schulenklopper at [facility] for [employer]. He requires an RX be faxed to him at [phone] Left hip and possibly R shoulder

## 2019-07-02 DIAGNOSIS — M25511 Pain in right shoulder: Secondary | ICD-10-CM | POA: Diagnosis not present

## 2019-07-02 DIAGNOSIS — M545 Low back pain: Secondary | ICD-10-CM | POA: Diagnosis not present

## 2019-07-05 DIAGNOSIS — M545 Low back pain: Secondary | ICD-10-CM | POA: Diagnosis not present

## 2019-07-05 DIAGNOSIS — M25511 Pain in right shoulder: Secondary | ICD-10-CM | POA: Diagnosis not present

## 2019-07-09 DIAGNOSIS — M545 Low back pain: Secondary | ICD-10-CM | POA: Diagnosis not present

## 2019-07-09 DIAGNOSIS — M25511 Pain in right shoulder: Secondary | ICD-10-CM | POA: Diagnosis not present

## 2019-07-12 ENCOUNTER — Ambulatory Visit: Payer: Medicare Other | Admitting: Family Medicine

## 2019-07-12 DIAGNOSIS — M25511 Pain in right shoulder: Secondary | ICD-10-CM | POA: Diagnosis not present

## 2019-07-12 DIAGNOSIS — M545 Low back pain: Secondary | ICD-10-CM | POA: Diagnosis not present

## 2019-07-16 ENCOUNTER — Other Ambulatory Visit: Payer: Self-pay

## 2019-07-16 ENCOUNTER — Ambulatory Visit (INDEPENDENT_AMBULATORY_CARE_PROVIDER_SITE_OTHER): Payer: Medicare Other | Admitting: Family Medicine

## 2019-07-16 ENCOUNTER — Encounter: Payer: Self-pay | Admitting: Family Medicine

## 2019-07-16 VITALS — BP 120/78 | HR 62 | Ht 65.0 in | Wt 121.0 lb

## 2019-07-16 DIAGNOSIS — M999 Biomechanical lesion, unspecified: Secondary | ICD-10-CM

## 2019-07-16 DIAGNOSIS — M5136 Other intervertebral disc degeneration, lumbar region: Secondary | ICD-10-CM | POA: Diagnosis not present

## 2019-07-16 NOTE — Assessment & Plan Note (Signed)

## 2019-07-16 NOTE — Assessment & Plan Note (Signed)
Chronic problem : Exacerbation  interventions previously, including medication management: Doing relatively well with   Interventions this visit: Osteopathic manipulation We discussed with patient the importance ergonomics, home exercises, icing regimen, and over-the-counter natural products.   Future considerations but will be based on evaluation and next visit: Consider imaging if worsening    Return to clinic: 6 weeks

## 2019-07-16 NOTE — Progress Notes (Signed)
[facility] [address] [place] Phone: [phone] Subjective:   I Jennifer [name] am serving as a Education administrator for Dr. Hulan Saas.  This visit occurred during the SARS-CoV-2 public health emergency.  Safety protocols were in place, including screening questions prior to the visit, additional usage of staff PPE, and extensive cleaning of exam room while observing appropriate contact time as indicated for disinfecting solutions.   I'm seeing this patient by the request  of:  Josetta Huddle, MD  CC: Low back pain follow-up  [id number]   06/18/2019 Severe overall.  Patient does not want any surgical intervention, doing relatively well.  Discussed the possibility of different injections with patient has declined at the moment.  Patient will increase activity tolerated.  Follow-up again in 4 to 8 weeks chronic problem with exacerbation. Degenerative disc disease.  Lumbar.  Chronic problem.  Likely an exacerbation causing some mild radicular symptoms into the left gluteal area.  Home exercises given today, discussed which activities to doing which wants to avoid.  Discussed posture and ergonomics, follow-up again in 4 to 8 weeks.  Patient responded fairly well to osteopathic manipulation.   Update 07/16/2019 Jennifer [name] is a 74 y.o. female coming in with complaint of back and right shoulder pain. Patient has been going to PT for dry needling. Patient states she is doing really well and the dry needling is working.  Patient feels like she is making some progress.  Continues to have some discomfort in the shoulder.  Wants to avoid any type of surgical intervention.  Has responded well though to osteopathic manipulation in the lower back.  Feels like the shoulder is doing stable at the moment.     Past Medical History:  Diagnosis Date  . Arthritis   . DJD of shoulder   . Hyperlipidemia   . Insomnia   . Internal hemorrhoids   . Tubular  adenoma of colon   . Vitamin D deficiency    Past Surgical History:  Procedure Laterality Date  . FOOT SURGERY Left   . FOOT SURGERY Right   . HIATAL HERNIA REPAIR  1954   age 77  . OOPHORECTOMY    . SHOULDER ARTHROSCOPY Right   . TOTAL ABDOMINAL HYSTERECTOMY W/ BILATERAL SALPINGOOPHORECTOMY  2007   Social History   Socioeconomic History  . Marital status: Married    Spouse name: Not on file  . Number of children: 2  . Years of education: Not on file  . Highest education level: Not on file  Occupational History  . Not on file  Tobacco Use  . Smoking status: Never Smoker  . Smokeless tobacco: Never Used  Substance and Sexual Activity  . Alcohol use: Yes    Alcohol/week: 0.0 standard drinks    Comment: 3 per week  . Drug use: No  . Sexual activity: Yes  Other Topics Concern  . Not on file  Social History Narrative  . Not on file   Social Determinants of Health   Financial Resource Strain:   . Difficulty of Paying Living Expenses:   Food Insecurity:   . Worried About Charity fundraiser in the Last Year:   . Arboriculturist in the Last Year:   Transportation Needs:   . Film/video editor (Medical):   Marland Kitchen Lack of Transportation (Non-Medical):   Physical Activity:   . Days of Exercise per Week:   . Minutes of Exercise per Session:  Stress:   . Feeling of Stress :   Social Connections:   . Frequency of Communication with Friends and Family:   . Frequency of Social Gatherings with Friends and Family:   . Attends Religious Services:   . Active Member of Clubs or Organizations:   . Attends Archivist Meetings:   Marland Kitchen Marital Status:    Allergies  Allergen Reactions  . Latex Other (See Comments)    Patient preference   . Penicillins     REACTION: Childhood reaction   Family History  Problem Relation Age of Onset  . Hypertension Mother   . Breast cancer Mother   . Heart disease Mother   . Heart attack Father 89       deceased  . Prostate cancer  Maternal Uncle      Current Outpatient Medications (Cardiovascular):  .  omega-3 acid ethyl esters (LOVAZA) 1 G capsule, Take 1 g by mouth 2 (two) times a week.      Current Outpatient Medications (Other):  Marland Kitchen  Ascorbic Acid (VITAMIN C) 100 MG tablet, Take 100 mg by mouth 2 (two) times a week.  Marland Kitchen  b complex vitamins tablet, Take 1 tablet by mouth 2 (two) times a week.  .  cholecalciferol (VITAMIN D) 1000 units tablet, Take 1,000 Units by mouth daily. .  magnesium oxide (MAG-OX) 400 MG tablet, Take 400 mg by mouth daily.    Reviewed prior external information including notes and imaging from  primary care provider As well as notes that were available from care everywhere and other healthcare systems.  Past medical history, social, surgical and family history all reviewed in electronic medical record.  No pertanent information unless stated regarding to the chief complaint.   Review of Systems:  No headache, visual changes, nausea, vomiting, diarrhea, constipation, dizziness, abdominal pain, skin rash, fevers, chills, night sweats, weight loss, swollen lymph nodes, body aches, joint swelling, chest pain, shortness of breath, mood changes. POSITIVE muscle aches  Objective  Blood pressure 120/78, pulse 62, height 5\' 5"  (1.651 m), weight 121 lb (54.9 kg), SpO2 98 %.   General: No apparent distress alert and oriented x3 mood and affect normal, dressed appropriately.  HEENT: Pupils equal, extraocular movements intact  Respiratory: Patient's speak in full sentences and does not appear short of breath  Cardiovascular: No lower extremity edema, non tender, no erythema  Neuro: Cranial nerves II through XII are intact, neurovascularly intact in all extremities with 2+ DTRs and 2+ pulses.  Gait normal with good balance and coordination.  MSK: Arthritic changes of multiple joints.  Patient does have atrophy noted right shoulder. Low back exam does show the patient does have significant  degenerative scoliosis noted.  Tender to palpation in the paraspinal musculature of the lumbar spine right greater than left.  Positive Faber on the right side.  Negative straight leg test today.  Osteopathic findings  T6 extended rotated and side bent left L4 flexed rotated and side bent left Sacrum right on right     Impression and Recommendations:      The above documentation has been reviewed and is accurate and complete [name], DO       Note: This dictation was prepared with Dragon dictation along with smaller phrase technology. Any transcriptional errors that result from this process are unintentional.

## 2019-07-16 NOTE — Patient Instructions (Signed)
16th inch heel lifts in both shoes See me again in 7 weeks

## 2019-07-18 DIAGNOSIS — M25511 Pain in right shoulder: Secondary | ICD-10-CM | POA: Diagnosis not present

## 2019-07-18 DIAGNOSIS — M545 Low back pain: Secondary | ICD-10-CM | POA: Diagnosis not present

## 2019-09-03 ENCOUNTER — Ambulatory Visit: Payer: Medicare Other | Admitting: Family Medicine

## 2019-09-10 ENCOUNTER — Ambulatory Visit: Payer: Medicare Other | Admitting: Family Medicine

## 2019-09-10 NOTE — Progress Notes (Deleted)
[place] Phone: [phone] Subjective:    I'm seeing this patient by the request  of:  Josetta Huddle, MD  CC:   [id number]  Jennifer [name] is a 74 y.o. female coming in with complaint of back pain. Last seen on 07/16/2019 for OMT. Patient states        Past Medical History:  Diagnosis Date  . Arthritis   . DJD of shoulder   . Hyperlipidemia   . Insomnia   . Internal hemorrhoids   . Tubular adenoma of colon   . Vitamin D deficiency    Past Surgical History:  Procedure Laterality Date  . FOOT SURGERY Left   . FOOT SURGERY Right   . HIATAL HERNIA REPAIR  1954   age 56  . OOPHORECTOMY    . SHOULDER ARTHROSCOPY Right   . TOTAL ABDOMINAL HYSTERECTOMY W/ BILATERAL SALPINGOOPHORECTOMY  2007   Social History   Socioeconomic History  . Marital status: Married    Spouse name: Not on file  . Number of children: 2  . Years of education: Not on file  . Highest education level: Not on file  Occupational History  . Not on file  Tobacco Use  . Smoking status: Never Smoker  . Smokeless tobacco: Never Used  Substance and Sexual Activity  . Alcohol use: Yes    Alcohol/week: 0.0 standard drinks    Comment: 3 per week  . Drug use: No  . Sexual activity: Yes  Other Topics Concern  . Not on file  Social History Narrative  . Not on file   Social Determinants of Health   Financial Resource Strain:   . Difficulty of Paying Living Expenses:   Food Insecurity:   . Worried About Charity fundraiser in the Last Year:   . Arboriculturist in the Last Year:   Transportation Needs:   . Film/video editor (Medical):   Marland Kitchen Lack of Transportation (Non-Medical):   Physical Activity:   . Days of Exercise per Week:   . Minutes of Exercise per Session:   Stress:   . Feeling of Stress :   Social Connections:   . Frequency of Communication with Friends and Family:   . Frequency of Social Gatherings  with Friends and Family:   . Attends Religious Services:   . Active Member of Clubs or Organizations:   . Attends Archivist Meetings:   Marland Kitchen Marital Status:    Allergies  Allergen Reactions  . Latex Other (See Comments)    Patient preference   . Penicillins     REACTION: Childhood reaction   Family History  Problem Relation Age of Onset  . Hypertension Mother   . Breast cancer Mother   . Heart disease Mother   . Heart attack Father 38       deceased  . Prostate cancer Maternal Uncle      Current Outpatient Medications (Cardiovascular):  .  omega-3 acid ethyl esters (LOVAZA) 1 G capsule, Take 1 g by mouth 2 (two) times a week.      Current Outpatient Medications (Other):  Marland Kitchen  Ascorbic Acid (VITAMIN C) 100 MG tablet, Take 100 mg by mouth 2 (two) times a week.  Marland Kitchen  b complex vitamins tablet, Take 1 tablet by mouth 2 (two) times a week.  .  cholecalciferol (VITAMIN D) 1000 units tablet, Take 1,000 Units by mouth daily. .  magnesium oxide (  MAG-OX) 400 MG tablet, Take 400 mg by mouth daily.    Reviewed prior external information including notes and imaging from  primary care provider As well as notes that were available from care everywhere and other healthcare systems.  Past medical history, social, surgical and family history all reviewed in electronic medical record.  No pertanent information unless stated regarding to the chief complaint.   Review of Systems:  No headache, visual changes, nausea, vomiting, diarrhea, constipation, dizziness, abdominal pain, skin rash, fevers, chills, night sweats, weight loss, swollen lymph nodes, body aches, joint swelling, chest pain, shortness of breath, mood changes. POSITIVE muscle aches  Objective  There were no vitals taken for this visit.   General: No apparent distress alert and oriented x3 mood and affect normal, dressed appropriately.  HEENT: Pupils equal, extraocular movements intact  Respiratory: Patient's speak in  full sentences and does not appear short of breath  Cardiovascular: No lower extremity edema, non tender, no erythema  Neuro: Cranial nerves II through XII are intact, neurovascularly intact in all extremities with 2+ DTRs and 2+ pulses.  Gait normal with good balance and coordination.  MSK:  Non tender with full range of motion and good stability and symmetric strength and tone of shoulders, elbows, wrist, hip, knee and ankles bilaterally.     Impression and Recommendations:     This case required medical decision making of moderate complexity. The above documentation has been reviewed and is accurate and complete [name]       Note: This dictation was prepared with Dragon dictation along with smaller phrase technology. Any transcriptional errors that result from this process are unintentional.

## 2019-10-08 ENCOUNTER — Encounter: Payer: Self-pay | Admitting: Family Medicine

## 2019-10-08 ENCOUNTER — Other Ambulatory Visit: Payer: Self-pay

## 2019-10-08 ENCOUNTER — Ambulatory Visit (INDEPENDENT_AMBULATORY_CARE_PROVIDER_SITE_OTHER): Payer: Medicare Other | Admitting: Family Medicine

## 2019-10-08 VITALS — BP 100/70 | HR 58 | Ht 65.0 in | Wt 125.0 lb

## 2019-10-08 DIAGNOSIS — M1811 Unilateral primary osteoarthritis of first carpometacarpal joint, right hand: Secondary | ICD-10-CM

## 2019-10-08 DIAGNOSIS — M19011 Primary osteoarthritis, right shoulder: Secondary | ICD-10-CM | POA: Diagnosis not present

## 2019-10-08 DIAGNOSIS — M999 Biomechanical lesion, unspecified: Secondary | ICD-10-CM

## 2019-10-08 DIAGNOSIS — M5136 Other intervertebral disc degeneration, lumbar region: Secondary | ICD-10-CM

## 2019-10-08 NOTE — Progress Notes (Signed)
[facility] [address] [place] Phone: [phone] Subjective:   I Jennifer [name] am serving as a Education administrator for Dr. Hulan Saas.  This visit occurred during the SARS-CoV-2 public health emergency.  Safety protocols were in place, including screening questions prior to the visit, additional usage of staff PPE, and extensive cleaning of exam room while observing appropriate contact time as indicated for disinfecting solutions.   I'm seeing this patient by the request  of:  Josetta Huddle, MD  CC: Neck pain, back pain follow-up  [id number]  Jennifer [name] is a 74 y.o. female coming in with complaint of back and neck pain. OMT 07/16/2019 Patient states she has right thumb pain. Patient states she is a little stiff today. Patient states thumb pain is overuse. Patient doesn't have weakness or loss or ROM. Just pain with certain ADLs. 4/10 at its worse   Patient said still having some mild aches and pains of multiple different joints.  Having more discomfort and pain, but continued right shoulder.  Known arthritic changes.  Patient also is having some mild back pain.  Known to have degenerative disc disease.  No significant radicular symptoms but does have some aching sensation.  Not stopping her from any activity.          Reviewed prior external information including notes and imaging from previsou exam, outside providers and external EMR if available.   As well as notes that were available from care everywhere and other healthcare systems.  Past medical history, social, surgical and family history all reviewed in electronic medical record.  No pertanent information unless stated regarding to the chief complaint.   Past Medical History:  Diagnosis Date  . Arthritis   . DJD of shoulder   . Hyperlipidemia   . Insomnia   . Internal hemorrhoids   . Tubular adenoma of colon   . Vitamin D deficiency     Allergies  Allergen  Reactions  . Latex Other (See Comments)    Patient preference   . Penicillins     REACTION: Childhood reaction     Review of Systems:  No headache, visual changes, nausea, vomiting, diarrhea, constipation, dizziness, abdominal pain, skin rash, fevers, chills, night sweats, weight loss, swollen lymph nodes, joint swelling, chest pain, shortness of breath, mood changes. POSITIVE muscle aches, body aches  Objective  Blood pressure 100/70, pulse (!) 58, height 5\' 5"  (1.651 m), weight 125 lb (56.7 kg), SpO2 98 %.   General: No apparent distress alert and oriented x3 mood and affect normal, dressed appropriately.  HEENT: Pupils equal, extraocular movements intact  Respiratory: Patient's speak in full sentences and does not appear short of breath  Cardiovascular: No lower extremity edema, non tender, no erythema  Neuro: Cranial nerves II through XII are intact, neurovascularly intact in all extremities with 2+ DTRs and 2+ pulses.  Gait normal with good balance and coordination.  MSK: Arthritic changes of multiple joints.  Right CMC joint does have positive grind test noted.  Mild thenar eminence wasting noted.  Continued decreased range of motion of the right shoulder with atrophy of the musculature noted.  Low back exam does have degenerative scoliosis noted.  Tightness with Corky Sox especially left greater than right.  Negative straight leg test at the moment.  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  Discussed posture and ergonomics, which activities to do which wants to avoid.  Increase activity as tolerated.  Follow-up again in 4 to 8 weeks  CMC arthritis.  Will monitor.  Potential injections and  bracing necessary.  Given suggestions of bracing at night.  Right shoulder arthritis no change in management  Nonallopathic problems  Decision today to treat with OMT was based on Physical Exam  After verbal consent patient was treated with HVLA, ME, FPR techniques in thoracic, lumbar, and sacral  areas  Patient tolerated the procedure well with improvement in symptoms  Patient given exercises, stretches and lifestyle modifications  See medications in patient instructions if given  Patient will follow up in 4-8 weeks      The above documentation has been reviewed and is accurate and complete [name], DO       Note: This dictation was prepared with Dragon dictation along with smaller phrase technology. Any transcriptional errors that result from this process are unintentional.

## 2019-10-08 NOTE — Patient Instructions (Addendum)
Good to see you Thumb spica splint See me in 2 months for manipulation

## 2019-10-18 ENCOUNTER — Other Ambulatory Visit: Payer: Self-pay | Admitting: Internal Medicine

## 2019-10-18 DIAGNOSIS — E2839 Other primary ovarian failure: Secondary | ICD-10-CM

## 2019-10-18 DIAGNOSIS — M8580 Other specified disorders of bone density and structure, unspecified site: Secondary | ICD-10-CM

## 2019-10-30 ENCOUNTER — Ambulatory Visit (INDEPENDENT_AMBULATORY_CARE_PROVIDER_SITE_OTHER): Payer: Medicare Other | Admitting: Family Medicine

## 2019-10-30 ENCOUNTER — Encounter: Payer: Self-pay | Admitting: Family Medicine

## 2019-10-30 ENCOUNTER — Other Ambulatory Visit: Payer: Self-pay

## 2019-10-30 VITALS — BP 120/70 | HR 64 | Ht 65.0 in | Wt 122.0 lb

## 2019-10-30 DIAGNOSIS — M5136 Other intervertebral disc degeneration, lumbar region: Secondary | ICD-10-CM

## 2019-10-30 DIAGNOSIS — M999 Biomechanical lesion, unspecified: Secondary | ICD-10-CM | POA: Diagnosis not present

## 2019-10-30 NOTE — Patient Instructions (Addendum)
Good to see you Keep on swimming See me again in 5-6 weeks

## 2019-10-30 NOTE — Assessment & Plan Note (Signed)
Stable.  Mild exacerbation here is a chronic problem.  Does not feel like she needs any type of medications though.  Discussed with patient about icing regimen and home exercises, avoid certain activities including repetitive extension.  Continue go swimming just discussed avoiding sitting such as backstroke.  Follow-up again in 4 to 8 weeks.

## 2019-10-30 NOTE — Progress Notes (Signed)
[facility] [address] [place] Phone: [phone] Subjective:   I Jennifer [name] am serving as a Education administrator for Dr. Hulan Saas.  This visit occurred during the SARS-CoV-2 public health emergency.  Safety protocols were in place, including screening questions prior to the visit, additional usage of staff PPE, and extensive cleaning of exam room while observing appropriate contact time as indicated for disinfecting solutions.   I'm seeing this patient by the request  of:  Josetta Huddle, MD  CC: Back and neck pain follow-up  [id number]  Jennifer [name] is a 74 y.o. female coming in with complaint of back and neck pain. OMT 10/08/2019. Patient states she is better. Went to massage therapy yesterday. About 2 days ago she barely made it home. Went swimming and felt pain and nausea. States she is not sure what happened but is feeling better.   Medications patient has been prescribed: Only over-the-counter vitamin supplementations          Reviewed prior external information including notes and imaging from previsou exam, outside providers and external EMR if available.   As well as notes that were available from care everywhere and other healthcare systems.  Past medical history, social, surgical and family history all reviewed in electronic medical record.  No pertanent information unless stated regarding to the chief complaint.   Past Medical History:  Diagnosis Date  . Arthritis   . DJD of shoulder   . Hyperlipidemia   . Insomnia   . Internal hemorrhoids   . Tubular adenoma of colon   . Vitamin D deficiency     Allergies  Allergen Reactions  . Latex Other (See Comments)    Patient preference   . Penicillins     REACTION: Childhood reaction     Review of Systems:  No headache, visual changes, nausea, vomiting, diarrhea, constipation, dizziness, abdominal pain, skin rash, fevers, chills, night sweats, weight loss,  swollen lymph nodes, joint swelling, chest pain, shortness of breath, mood changes. POSITIVE muscle aches, body aches  Objective  Blood pressure 120/70, pulse 64, height 5\' 5"  (1.651 m), weight 122 lb (55.3 kg), SpO2 99 %.   General: No apparent distress alert and oriented x3 mood and affect normal, dressed appropriately.  HEENT: Pupils equal, extraocular movements intact  Respiratory: Patient's speak in full sentences and does not appear short of breath  Cardiovascular: No lower extremity edema, non tender, no erythema  Neuro: Cranial nerves II through XII are intact, neurovascularly intact in all extremities with 2+ DTRs and 2+ pulses.  Gait normal with good balance and coordination.  MSK: Arthritic changes of multiple joints.  Patient's diagnosis is degenerative scoliosis noted.  Patient does have some tightness with FABER test bilaterally right greater than left.  Negative straight leg test.   Osteopathic findings   T6 extended rotated and side bent right L5 flexed rotated and side bent left Sacrum right on right       Assessment and Plan:  No problem-specific Assessment & Plan notes found for this encounter.    Nonallopathic problems  Decision today to treat with OMT was based on Physical Exam  After verbal consent patient was treated with HVLA, ME, FPR techniques in thoracic, lumbar, and sacral  areas  Patient tolerated the procedure well with improvement in symptoms  Patient given exercises, stretches and lifestyle modifications  See medications in patient instructions if given  Patient will follow up in 4-8 weeks  The above documentation has been reviewed and is accurate and complete [name], DO       Note: This dictation was prepared with Dragon dictation along with smaller phrase technology. Any transcriptional errors that result from this process are unintentional.

## 2019-11-01 DIAGNOSIS — M19011 Primary osteoarthritis, right shoulder: Secondary | ICD-10-CM | POA: Diagnosis not present

## 2019-11-08 ENCOUNTER — Other Ambulatory Visit: Payer: Self-pay | Admitting: Orthopedic Surgery

## 2019-11-08 DIAGNOSIS — R52 Pain, unspecified: Secondary | ICD-10-CM

## 2019-12-02 ENCOUNTER — Ambulatory Visit
Admission: RE | Admit: 2019-12-02 | Discharge: 2019-12-02 | Disposition: A | Discharge status: Home / Self Care | Payer: Medicare Other | Source: Ambulatory Visit | Attending: Orthopedic Surgery | Admitting: Orthopedic Surgery

## 2019-12-02 DIAGNOSIS — R52 Pain, unspecified: Secondary | ICD-10-CM

## 2019-12-02 DIAGNOSIS — Z01818 Encounter for other preprocedural examination: Secondary | ICD-10-CM | POA: Diagnosis not present

## 2019-12-02 DIAGNOSIS — M19011 Primary osteoarthritis, right shoulder: Secondary | ICD-10-CM | POA: Diagnosis not present

## 2019-12-06 ENCOUNTER — Other Ambulatory Visit: Payer: Self-pay

## 2019-12-06 ENCOUNTER — Encounter: Payer: Self-pay | Admitting: Family Medicine

## 2019-12-06 ENCOUNTER — Ambulatory Visit (INDEPENDENT_AMBULATORY_CARE_PROVIDER_SITE_OTHER): Payer: Medicare Other | Admitting: Family Medicine

## 2019-12-06 VITALS — BP 104/72 | HR 66 | Ht 65.0 in | Wt 124.0 lb

## 2019-12-06 DIAGNOSIS — M51369 Other intervertebral disc degeneration, lumbar region without mention of lumbar back pain or lower extremity pain: Secondary | ICD-10-CM

## 2019-12-06 DIAGNOSIS — M5136 Other intervertebral disc degeneration, lumbar region: Secondary | ICD-10-CM | POA: Diagnosis not present

## 2019-12-06 DIAGNOSIS — M19011 Primary osteoarthritis, right shoulder: Secondary | ICD-10-CM

## 2019-12-06 DIAGNOSIS — M999 Biomechanical lesion, unspecified: Secondary | ICD-10-CM | POA: Diagnosis not present

## 2019-12-06 NOTE — Progress Notes (Signed)
[place] Phone: [phone] Subjective:   [name], am serving as a scribe for Dr. Hulan Saas. This visit occurred during the SARS-CoV-2 public health emergency.  Safety protocols were in place, including screening questions prior to the visit, additional usage of staff PPE, and extensive cleaning of exam room while observing appropriate contact time as indicated for disinfecting solutions.  I'm seeing this patient by the request  of:  Josetta Huddle, MD  CC: Left shoulder pain, neck pain follow-up  [id number]  Narda Fundora is a 74 y.o. female coming in with complaint of back and neck pain. OMT 10/30/2019. Patient states that she is having left hip pain that is managed with OMT.  Had right shoulder CT from Dr. Tamera Punt. No pain. Wants opinion on results.  CT scan was independently visualized by myself showing the patient does have severe degenerative joint disease of the shoulder noted.  Right hand CMC joint pain.   Patient is only taking over-the-counter medications         Reviewed prior external information including notes and imaging from previsou exam, outside providers and external EMR if available.   As well as notes that were available from care everywhere and other healthcare systems.  Past medical history, social, surgical and family history all reviewed in electronic medical record.  No pertanent information unless stated regarding to the chief complaint.   Past Medical History:  Diagnosis Date  . Arthritis   . DJD of shoulder   . Hyperlipidemia   . Insomnia   . Internal hemorrhoids   . Tubular adenoma of colon   . Vitamin D deficiency     Allergies  Allergen Reactions  . Latex Other (See Comments)    Patient preference   . Penicillins     REACTION: Childhood reaction     Review of Systems:  No headache, visual changes, nausea, vomiting, diarrhea, constipation,  dizziness, abdominal pain, skin rash, fevers, chills, night sweats, weight loss, swollen lymph nodes, body aches, joint swelling, chest pain, shortness of breath, mood changes. POSITIVE muscle aches  Objective  Blood pressure 104/72, pulse 66, height 5\' 5"  (1.651 m), weight 124 lb (56.2 kg), SpO2 98 %.   General: No apparent distress alert and oriented x3 mood and affect normal, dressed appropriately.  HEENT: Pupils equal, extraocular movements intact  Respiratory: Patient's speak in full sentences and does not appear short of breath  Cardiovascular: No lower extremity edema, non tender, no erythema  Neuro: Cranial nerves II through XII are intact, neurovascularly intact in all extremities with 2+ DTRs and 2+ pulses. l Gait mild antalgic MSK: Severe atrophy of the musculature around the right shoulder.  Patient does have limited range of motion in all planes. Back -patient does have degenerative scoliosis noted of the lumbar spine's seems most severe at the thoracolumbar juncture.  Tenderness to palpation over the left sacroiliac joint.  Osteopathic findings   T10 extended rotated and side bent left L2 flexed rotated and side bent right Sacrum left on left       Assessment and Plan:    Nonallopathic problems  Decision today to treat with OMT was based on Physical Exam  After verbal consent patient was treated with HVLA, ME, FPR techniques in thoracic, lumbar, and sacral  areas  Patient tolerated the procedure well with improvement in symptoms  Patient given exercises, stretches and lifestyle modifications  See medications in patient instructions if given  Patient will follow up in 4-8 weeks      The above documentation has been reviewed and is accurate and complete [name], DO       Note: This dictation was prepared with Dragon dictation along with smaller phrase technology. Any transcriptional errors that result from this process are unintentional.

## 2019-12-06 NOTE — Assessment & Plan Note (Signed)
Stable.  Discussed icing regimen and home exercise, which activities to do we discussed avoiding certain activities.  Discussed icing regimen.  Increase activity slowly.  Responding well to manipulation and will continue every 6 to 8-week intervals.  Follow-up again at that time

## 2019-12-06 NOTE — Assessment & Plan Note (Signed)
CT scan of the shoulder shows the patient does have significant arthritic changes.  We discussed with patient about the difference from shoulder replacement and reverse shoulder replacement.  Told her multiple times that I would defer to Dr. Bettina Gavia expertise on what would be best for her.

## 2019-12-06 NOTE — Patient Instructions (Signed)
Overall not bad See what Tamera Punt says See me in 8 weeks

## 2019-12-10 ENCOUNTER — Ambulatory Visit: Payer: Medicare Other | Admitting: Family Medicine

## 2019-12-10 ENCOUNTER — Telehealth: Payer: Self-pay | Admitting: Family Medicine

## 2019-12-10 NOTE — Telephone Encounter (Signed)
Patient called asking if Dr Tamala Julian thinks that the patient will eventually need a shoulder replacement? She said that if it seems to be inevetable then she will go ahead and do it.  Please advise. (Okay to leave a message)

## 2019-12-10 NOTE — Telephone Encounter (Signed)
Spoke with patient. She has an appointment with Dr. Tamera Punt on Friday. She will let us know what he says.

## 2019-12-10 NOTE — Telephone Encounter (Signed)
Yes eventually she will

## 2019-12-13 DIAGNOSIS — M19011 Primary osteoarthritis, right shoulder: Secondary | ICD-10-CM | POA: Diagnosis not present

## 2019-12-21 ENCOUNTER — Encounter: Payer: Self-pay | Admitting: Family Medicine

## 2020-01-15 ENCOUNTER — Ambulatory Visit
Admission: RE | Admit: 2020-01-15 | Discharge: 2020-01-15 | Disposition: A | Discharge status: Home / Self Care | Payer: Medicare Other | Source: Ambulatory Visit | Attending: Internal Medicine | Admitting: Internal Medicine

## 2020-01-15 ENCOUNTER — Other Ambulatory Visit: Payer: Self-pay

## 2020-01-15 DIAGNOSIS — Z780 Asymptomatic menopausal state: Secondary | ICD-10-CM | POA: Diagnosis not present

## 2020-01-15 DIAGNOSIS — M8580 Other specified disorders of bone density and structure, unspecified site: Secondary | ICD-10-CM

## 2020-01-15 DIAGNOSIS — E2839 Other primary ovarian failure: Secondary | ICD-10-CM

## 2020-01-15 DIAGNOSIS — M8588 Other specified disorders of bone density and structure, other site: Secondary | ICD-10-CM | POA: Diagnosis not present

## 2020-01-15 DIAGNOSIS — M810 Age-related osteoporosis without current pathological fracture: Secondary | ICD-10-CM | POA: Diagnosis not present

## 2020-01-19 DIAGNOSIS — Z23 Encounter for immunization: Secondary | ICD-10-CM | POA: Diagnosis not present

## 2020-01-27 DIAGNOSIS — R29898 Other symptoms and signs involving the musculoskeletal system: Secondary | ICD-10-CM | POA: Diagnosis not present

## 2020-01-27 DIAGNOSIS — M810 Age-related osteoporosis without current pathological fracture: Secondary | ICD-10-CM | POA: Diagnosis not present

## 2020-01-27 DIAGNOSIS — M8580 Other specified disorders of bone density and structure, unspecified site: Secondary | ICD-10-CM | POA: Diagnosis not present

## 2020-01-30 ENCOUNTER — Ambulatory Visit (INDEPENDENT_AMBULATORY_CARE_PROVIDER_SITE_OTHER): Payer: Medicare Other | Admitting: Family Medicine

## 2020-01-30 ENCOUNTER — Other Ambulatory Visit: Payer: Self-pay

## 2020-01-30 ENCOUNTER — Encounter: Payer: Self-pay | Admitting: Family Medicine

## 2020-01-30 VITALS — BP 120/62 | HR 60 | Ht 65.0 in | Wt 126.0 lb

## 2020-01-30 DIAGNOSIS — M810 Age-related osteoporosis without current pathological fracture: Secondary | ICD-10-CM | POA: Diagnosis not present

## 2020-01-30 DIAGNOSIS — M5136 Other intervertebral disc degeneration, lumbar region: Secondary | ICD-10-CM

## 2020-01-30 DIAGNOSIS — M51369 Other intervertebral disc degeneration, lumbar region without mention of lumbar back pain or lower extremity pain: Secondary | ICD-10-CM

## 2020-01-30 DIAGNOSIS — M999 Biomechanical lesion, unspecified: Secondary | ICD-10-CM | POA: Diagnosis not present

## 2020-01-30 MED ORDER — VITAMIN D (ERGOCALCIFEROL) 1.25 MG (50000 UNIT) PO CAPS: 50000.0000 [IU] | ORAL_CAPSULE | ORAL | 0 refills | Status: DC

## 2020-01-30 NOTE — Assessment & Plan Note (Signed)
Severe overall the patient wants to continue with conservative therapy.  History of the osteoporosis now started on a once weekly vitamin D.  Warned of potential side effects.  Patient does need to have this increased as well as her calcium.  Discussed icing regimen and home exercises.  If manipulation does not seem to help we will need to consider the possibility of formal physical therapy.  Patient is in agreement with the plan follow-up with me again in 6 weeks

## 2020-01-30 NOTE — Progress Notes (Signed)
[place] Phone: [phone] Subjective:   [name], am serving as a scribe for Dr. Hulan Saas. This visit occurred during the SARS-CoV-2 public health emergency.  Safety protocols were in place, including screening questions prior to the visit, additional usage of staff PPE, and extensive cleaning of exam room while observing appropriate contact time as indicated for disinfecting solutions.   I'm seeing this patient by the request  of:  Josetta Huddle, MD  CC: Back and neck pain  [id number]  Ramyah Pankowski is a 74 y.o. female coming in with complaint of back and neck pain. OMT 12/06/2019. Patient states that she has not had any issues since last visit. Patient wants to be sure we saw her bone density- -2.8 patient's primary care wants to hold on any type of Biologics at this time.  Patient is just to increase her calcium.  Patient feels that the osteopathic manipulation has not been quite as helpful as what it used to be in the past.  The patient feels like she continues to have pain afterwards.  Patient denies any fevers chills or any abnormal weight loss..           Reviewed prior external information including notes and imaging from previsou exam, outside providers and external EMR if available.   As well as notes that were available from care everywhere and other healthcare systems.  Past medical history, social, surgical and family history all reviewed in electronic medical record.  No pertanent information unless stated regarding to the chief complaint.   Past Medical History:  Diagnosis Date  . Arthritis   . DJD of shoulder   . Hyperlipidemia   . Insomnia   . Internal hemorrhoids   . Tubular adenoma of colon   . Vitamin D deficiency     Allergies  Allergen Reactions  . Latex Other (See Comments)    Patient preference   . Penicillins     REACTION: Childhood reaction      Review of Systems:  No headache, visual changes, nausea, vomiting, diarrhea, constipation, dizziness, abdominal pain, skin rash, fevers, chills, night sweats, weight loss, swollen lymph nodes, body aches, joint swelling, chest pain, shortness of breath, mood changes. POSITIVE muscle aches  Objective  Blood pressure 120/62, pulse 60, height 5\' 5"  (1.651 m), weight 126 lb (57.2 kg), SpO2 99 %.   General: No apparent distress alert and oriented x3 mood and affect normal, dressed appropriately.  HEENT: Pupils equal, extraocular movements intact  Respiratory: Patient's speak in full sentences and does not appear short of breath  Cardiovascular: No lower extremity edema, non tender, no erythema  Significant arthritic changes of multiple joints.  Low back has loss of lordosis with significant degenerative scoliosis.  Tightness noted with Corky Sox bilaterally.  Patient does have some pain with extension of the back.  Pain in the thoracolumbar juncture as well  Osteopathic findings   T11 extended rotated and side bent left L4 flexed rotated and side bent left Sacrum right on right       Assessment and Plan:  Degenerative disc disease, lumbar Severe overall the patient wants to continue with conservative therapy.  History of the osteoporosis now started on a once weekly vitamin D.  Warned of potential side effects.  Patient does need to have this increased as well as her calcium.  Discussed icing regimen and home exercises.  If manipulation does not seem to help we will  need to consider the possibility of formal physical therapy.  Patient is in agreement with the plan follow-up with me again in 6 weeks    Nonallopathic problems  Decision today to treat with OMT was based on Physical Exam  After verbal consent patient was treated with  ME, FPR techniques inthoracic, lumbar, and sacral  areas  Patient tolerated the procedure well with improvement in symptoms  Patient given exercises,  stretches and lifestyle modifications  See medications in patient instructions if given  Patient will follow up in  weeks      The above documentation has been reviewed and is accurate and complete [name], DO       Note: This dictation was prepared with Dragon dictation along with smaller phrase technology. Any transcriptional errors that result from this process are unintentional.

## 2020-01-30 NOTE — Patient Instructions (Signed)
Recheck vitamin D in 3 months Once weekly vitamin D  See me again in 6-8 weeks

## 2020-02-17 ENCOUNTER — Other Ambulatory Visit: Payer: Self-pay

## 2020-02-17 ENCOUNTER — Telehealth: Payer: Self-pay | Admitting: Family Medicine

## 2020-02-17 DIAGNOSIS — G8929 Other chronic pain: Secondary | ICD-10-CM

## 2020-02-17 DIAGNOSIS — M545 Low back pain, unspecified: Secondary | ICD-10-CM

## 2020-02-17 NOTE — Telephone Encounter (Signed)
Order placed. Patient notified.  

## 2020-02-17 NOTE — Telephone Encounter (Signed)
Patient called asking if an order could be sent over for dry needling to Dr Rudene Anda at [facility].

## 2020-02-19 ENCOUNTER — Encounter: Payer: Self-pay | Admitting: Family Medicine

## 2020-02-19 ENCOUNTER — Other Ambulatory Visit: Payer: Self-pay

## 2020-02-19 ENCOUNTER — Ambulatory Visit (INDEPENDENT_AMBULATORY_CARE_PROVIDER_SITE_OTHER): Payer: Medicare Other | Admitting: Family Medicine

## 2020-02-19 VITALS — BP 102/74 | HR 69 | Ht 65.0 in | Wt 126.0 lb

## 2020-02-19 DIAGNOSIS — Z23 Encounter for immunization: Secondary | ICD-10-CM | POA: Diagnosis not present

## 2020-02-19 DIAGNOSIS — M5136 Other intervertebral disc degeneration, lumbar region: Secondary | ICD-10-CM | POA: Diagnosis not present

## 2020-02-19 DIAGNOSIS — M999 Biomechanical lesion, unspecified: Secondary | ICD-10-CM

## 2020-02-19 NOTE — Assessment & Plan Note (Signed)
Patient has known degenerative disc disease.  Fairly severe.  Responding well though to the muscle energy with the OMT.  We discussed the over-the-counter medications at this time.  Patient has discontinued once weekly vitamin D.  Encouraged her to continue to stay active.  Found to have more of a greater trochanteric bursitis on the right side.  Patient does not want any type of injection.  Follow-up with me again in 6 to 8 weeks

## 2020-02-19 NOTE — Progress Notes (Signed)
Jennifer Jennifer [name] Phone: [phone] Subjective:   Jennifer Jennifer [name], am serving as a scribe for Dr. Hulan Saas. This visit occurred during the SARS-CoV-2 public health emergency.  Safety protocols were in place, including screening questions prior to the visit, additional usage of staff PPE, and extensive cleaning of exam room while observing appropriate contact time as indicated for disinfecting solutions.   I'm seeing this patient by the request  of:  Jennifer Huddle, MD  CC: Neck and back pain follow-up  [id number]  Jennifer Jennifer [name] is a 74 y.o. female coming in with complaint of back and neck pain. OMT 01/30/2020. Patient states that her right hip pain did not improve after last visit. Patient does have appointment for dry needling in a couple of weeks. Is having pain at night that keeps her awake. Denies any radiating symptoms.  Patient does not want any type of injection.  Medications patient has been prescribed: Vit D now doing over-the-counter  Taking: Over-the-counter at this time         Reviewed prior external information including notes and imaging from previsou exam, outside providers and external EMR if available.   As well as notes that were available from care everywhere and other healthcare systems.  Past medical history, social, surgical and family history all reviewed in electronic medical record.  Jennifer [name] pertanent information unless stated regarding to the chief complaint.   Past Medical History:  Diagnosis Date  . Arthritis   . DJD of shoulder   . Hyperlipidemia   . Insomnia   . Internal hemorrhoids   . Tubular adenoma of colon   . Vitamin D deficiency     Allergies  Allergen Reactions  . Latex Other (See Comments)    Patient preference   . Penicillins     REACTION: Childhood reaction     Review of Systems:  Jennifer [name] headache, visual changes, nausea, vomiting, diarrhea, constipation,  dizziness, abdominal pain, skin rash, fevers, chills, night sweats, weight loss, swollen lymph nodes, body aches, joint swelling, chest pain, shortness of breath, mood changes. POSITIVE muscle aches  Objective  Blood pressure 102/74, pulse 69, height 5\' 5"  (1.651 m), weight 126 lb (57.2 kg), SpO2 99 %.   General: Jennifer [name] apparent distress alert and oriented x3 mood and affect normal, dressed appropriately.  HEENT: Pupils equal, extraocular movements intact  Respiratory: Patient's speak in full sentences and does not appear short of breath  Cardiovascular: Jennifer [name] lower extremity edema, non tender, Jennifer [name] erythema   Gait antalgic Back -significant arthritic changes of multiple joints.  Patient does have significant degenerative scoliosis noted.  Loss of lordosis.  Severe tenderness to palpation over the right greater trochanteric area.  Mild positive Corky Sox.  Negative straight leg test. Osteopathic findings  C2 flexed rotated and side bent right C6 flexed rotated and side bent left T3 extended rotated and side bent right inhaled rib T9 extended rotated and side bent left L2 flexed rotated and side bent right Sacrum right on right       Assessment and Plan:  Degenerative disc disease, lumbar Patient has known degenerative disc disease.  Fairly severe.  Responding well though to the muscle energy with the OMT.  We discussed the over-the-counter medications at this time.  Patient has discontinued once weekly vitamin D.  Encouraged her to continue to stay active.  Found to have more of a greater trochanteric bursitis on the right side.  Patient does not want  any type of injection.  Follow-up with me again in 6 to 8 weeks    Nonallopathic problems  Decision today to treat with OMT was based on Physical Exam  After verbal consent patient was treated with , ME, FPR techniques in  thoracic, lumbar, and sacral  areas  Patient tolerated the procedure well with improvement in symptoms  Patient given  exercises, stretches and lifestyle modifications  See medications in patient instructions if given  Patient will follow up in 4-8 weeks      The above documentation has been reviewed and is accurate and complete Jennifer Pulley, DO       Note: This dictation was prepared with Dragon dictation along with smaller phrase technology. Any transcriptional errors that result from this process are unintentional.

## 2020-02-19 NOTE — Patient Instructions (Signed)
Ice after activity especially walking Lets see how dry needling does See me in 5-6 weeks

## 2020-02-26 DIAGNOSIS — M25511 Pain in right shoulder: Secondary | ICD-10-CM | POA: Diagnosis not present

## 2020-02-26 DIAGNOSIS — M25551 Pain in right hip: Secondary | ICD-10-CM | POA: Diagnosis not present

## 2020-03-05 DIAGNOSIS — M25511 Pain in right shoulder: Secondary | ICD-10-CM | POA: Diagnosis not present

## 2020-03-05 DIAGNOSIS — M25551 Pain in right hip: Secondary | ICD-10-CM | POA: Diagnosis not present

## 2020-03-11 DIAGNOSIS — M25551 Pain in right hip: Secondary | ICD-10-CM | POA: Diagnosis not present

## 2020-03-11 DIAGNOSIS — M25511 Pain in right shoulder: Secondary | ICD-10-CM | POA: Diagnosis not present

## 2020-03-16 ENCOUNTER — Ambulatory Visit: Payer: Medicare Other | Admitting: Family Medicine

## 2020-03-23 DIAGNOSIS — M25551 Pain in right hip: Secondary | ICD-10-CM | POA: Diagnosis not present

## 2020-03-23 DIAGNOSIS — M25511 Pain in right shoulder: Secondary | ICD-10-CM | POA: Diagnosis not present

## 2020-03-26 ENCOUNTER — Ambulatory Visit: Payer: Medicare Other | Admitting: Family Medicine

## 2020-03-30 DIAGNOSIS — L578 Other skin changes due to chronic exposure to nonionizing radiation: Secondary | ICD-10-CM | POA: Diagnosis not present

## 2020-03-30 DIAGNOSIS — L821 Other seborrheic keratosis: Secondary | ICD-10-CM | POA: Diagnosis not present

## 2020-03-30 DIAGNOSIS — L814 Other melanin hyperpigmentation: Secondary | ICD-10-CM | POA: Diagnosis not present

## 2020-03-30 DIAGNOSIS — D2272 Melanocytic nevi of left lower limb, including hip: Secondary | ICD-10-CM | POA: Diagnosis not present

## 2020-03-30 DIAGNOSIS — D225 Melanocytic nevi of trunk: Secondary | ICD-10-CM | POA: Diagnosis not present

## 2020-04-01 DIAGNOSIS — M9903 Segmental and somatic dysfunction of lumbar region: Secondary | ICD-10-CM | POA: Diagnosis not present

## 2020-04-01 DIAGNOSIS — M9902 Segmental and somatic dysfunction of thoracic region: Secondary | ICD-10-CM | POA: Diagnosis not present

## 2020-04-01 DIAGNOSIS — M9901 Segmental and somatic dysfunction of cervical region: Secondary | ICD-10-CM | POA: Diagnosis not present

## 2020-04-01 DIAGNOSIS — M4604 Spinal enthesopathy, thoracic region: Secondary | ICD-10-CM | POA: Diagnosis not present

## 2020-04-13 DIAGNOSIS — M25551 Pain in right hip: Secondary | ICD-10-CM | POA: Diagnosis not present

## 2020-04-13 DIAGNOSIS — M25511 Pain in right shoulder: Secondary | ICD-10-CM | POA: Diagnosis not present

## 2020-04-21 ENCOUNTER — Other Ambulatory Visit: Payer: Self-pay | Admitting: Family Medicine

## 2020-04-23 ENCOUNTER — Ambulatory Visit (INDEPENDENT_AMBULATORY_CARE_PROVIDER_SITE_OTHER): Payer: Medicare Other | Admitting: Family Medicine

## 2020-04-23 ENCOUNTER — Encounter: Payer: Self-pay | Admitting: Family Medicine

## 2020-04-23 ENCOUNTER — Other Ambulatory Visit: Payer: Self-pay

## 2020-04-23 ENCOUNTER — Ambulatory Visit (INDEPENDENT_AMBULATORY_CARE_PROVIDER_SITE_OTHER): Payer: Medicare Other

## 2020-04-23 DIAGNOSIS — M25552 Pain in left hip: Secondary | ICD-10-CM | POA: Diagnosis not present

## 2020-04-23 DIAGNOSIS — M5136 Other intervertebral disc degeneration, lumbar region: Secondary | ICD-10-CM

## 2020-04-23 DIAGNOSIS — M47816 Spondylosis without myelopathy or radiculopathy, lumbar region: Secondary | ICD-10-CM | POA: Diagnosis not present

## 2020-04-23 DIAGNOSIS — M25551 Pain in right hip: Secondary | ICD-10-CM | POA: Diagnosis not present

## 2020-04-23 NOTE — Assessment & Plan Note (Signed)
Degenerative disc disease.  Patient does have some scoliosis as well.  Has done relatively well with the conservative therapy.  Patient does not need manipulation today.  Passed on it today.  Patient will continue the conservative therapy and is able to start doing more weightbearing exercises.  We will get new lumbar and pelvis x-rays to further evaluate.  Follow-up with me again 2 to 3 months

## 2020-04-23 NOTE — Progress Notes (Signed)
  Jennifer [name] [address] [place] [address] Phone: [phone] Subjective:   [name], am serving as a scribe for Dr. Antoine Primas. This visit occurred during the SARS-CoV-2 public health emergency.  Safety protocols were in place, including screening questions prior to the visit, additional usage of staff PPE, and extensive cleaning of exam room while observing appropriate contact time as indicated for disinfecting solutions.   I'm seeing this patient by the request  of:  [name], MD  CC: Back and hip pain follow-up  [id number]  Jennifer [name] is a 75 y.o. female coming in with complaint of back and neck pain. OMT 02/19/2020. Patient requested referral be sent to [facility] to perform dry needling since last visit. Patient states overall feels like she has made improvement.  Patient states very mild discomfort from time to time.  Patient states that she is always aware of it but nothing that stopping her from activity.  Only thing a little different is some mild tingling in the right foot but nothing that stops her from activity.  Has been riding the bike more than anything else          Reviewed prior external information including notes and imaging from previsou exam, outside providers and external EMR if available.   As well as notes that were available from care everywhere and other healthcare systems.  Past medical history, social, surgical and family history all reviewed in electronic medical record.  No pertanent information unless stated regarding to the chief complaint.   Past Medical History:  Diagnosis Date  . Arthritis   . DJD of shoulder   . Hyperlipidemia   . Insomnia   . Internal hemorrhoids   . Tubular adenoma of colon   . Vitamin D deficiency     Allergies  Allergen Reactions  . Latex Other (See Comments)    Patient preference   . Penicillins     REACTION: Childhood reaction     Review of  Systems:  No headache, visual changes, nausea, vomiting, diarrhea, constipation, dizziness, abdominal pain, skin rash, fevers, chills, night sweats, weight loss, swollen lymph nodes, body aches, joint swelling, chest pain, shortness of breath, mood changes. POSITIVE muscle aches  Objective  Blood pressure 102/72, pulse 71, height 5\' 5"  (1.651 m), weight 126 lb (57.2 kg), SpO2 99 %.   General: No apparent distress alert and oriented x3 mood and affect normal, dressed appropriately.  HEENT: Pupils equal, extraocular movements intact  Respiratory: Patient's speak in full sentences and does not appear short of breath  Cardiovascular: No lower extremity edema, non tender, no erythema  Mild antalgic gait. Left hip mild tenderness over the gluteal area on the left side.  Mild pain in the paraspinal musculature of the lumbar spine.  Mild tightness with straight leg test but very minimal.  5-5 strength in lower extremities.       Assessment and Plan:        The above documentation has been reviewed and is accurate and complete , DO       Note: This dictation was prepared with Dragon dictation along with smaller phrase technology. Any transcriptional errors that result from this process are unintentional.

## 2020-04-23 NOTE — Patient Instructions (Signed)
Good to see you Ok to add elliptical  No manipulation today because you are doing so well See me again in 2-3 months

## 2020-04-24 ENCOUNTER — Encounter: Payer: Self-pay | Admitting: Family Medicine

## 2020-05-08 ENCOUNTER — Other Ambulatory Visit: Payer: Self-pay | Admitting: Internal Medicine

## 2020-05-08 DIAGNOSIS — Z1231 Encounter for screening mammogram for malignant neoplasm of breast: Secondary | ICD-10-CM

## 2020-05-12 DIAGNOSIS — M810 Age-related osteoporosis without current pathological fracture: Secondary | ICD-10-CM | POA: Diagnosis not present

## 2020-05-12 DIAGNOSIS — Z8639 Personal history of other endocrine, nutritional and metabolic disease: Secondary | ICD-10-CM | POA: Diagnosis not present

## 2020-05-12 DIAGNOSIS — I700 Atherosclerosis of aorta: Secondary | ICD-10-CM | POA: Diagnosis not present

## 2020-06-22 ENCOUNTER — Ambulatory Visit
Admission: RE | Admit: 2020-06-22 | Discharge: 2020-06-22 | Disposition: A | Discharge status: Home / Self Care | Payer: Medicare Other | Source: Ambulatory Visit | Attending: Internal Medicine | Admitting: Internal Medicine

## 2020-06-22 ENCOUNTER — Other Ambulatory Visit: Payer: Self-pay

## 2020-06-22 DIAGNOSIS — Z1231 Encounter for screening mammogram for malignant neoplasm of breast: Secondary | ICD-10-CM

## 2020-07-15 ENCOUNTER — Other Ambulatory Visit: Payer: Self-pay | Admitting: *Deleted

## 2020-07-15 ENCOUNTER — Ambulatory Visit: Payer: Medicare Other | Admitting: Family Medicine

## 2020-07-15 DIAGNOSIS — I7090 Unspecified atherosclerosis: Secondary | ICD-10-CM

## 2020-07-27 ENCOUNTER — Ambulatory Visit (INDEPENDENT_AMBULATORY_CARE_PROVIDER_SITE_OTHER)
Admission: RE | Admit: 2020-07-27 | Discharge: 2020-07-27 | Disposition: A | Discharge status: Home / Self Care | Payer: Medicare Other | Source: Ambulatory Visit | Attending: Surgery | Admitting: Surgery

## 2020-07-27 ENCOUNTER — Ambulatory Visit (HOSPITAL_COMMUNITY)
Admission: RE | Admit: 2020-07-27 | Discharge: 2020-07-27 | Disposition: A | Discharge status: Home / Self Care | Payer: Medicare Other | Source: Ambulatory Visit | Attending: Surgery | Admitting: Surgery

## 2020-07-27 ENCOUNTER — Other Ambulatory Visit: Payer: Self-pay

## 2020-07-27 DIAGNOSIS — I7090 Unspecified atherosclerosis: Secondary | ICD-10-CM

## 2020-08-03 ENCOUNTER — Ambulatory Visit (INDEPENDENT_AMBULATORY_CARE_PROVIDER_SITE_OTHER): Payer: Medicare Other | Admitting: Surgery

## 2020-08-03 ENCOUNTER — Encounter: Payer: Self-pay | Admitting: Surgery

## 2020-08-03 ENCOUNTER — Other Ambulatory Visit: Payer: Self-pay

## 2020-08-03 VITALS — BP 115/73 | HR 65 | Temp 97.6°F | Resp 16 | Ht 65.0 in | Wt 126.2 lb

## 2020-08-03 DIAGNOSIS — I739 Peripheral vascular disease, unspecified: Secondary | ICD-10-CM

## 2020-08-03 NOTE — Progress Notes (Signed)
[facility] [place]  Patient name: Jennifer [name] MRN: [id number] DOB: 19-Dec-1945 Sex: female   REQUESTING PROVIDER:   Dr. Eilene Ghazi   REASON FOR CONSULT:    atherosclerosis  HISTORY OF PRESENT ILLNESS:   Jennifer [name] is a 75 y.o. female, who is referred for cardiovascular evaluation.  She has a father who died of a heart attack in his early 34s and a sister who had severe atherosclerotic vascular disease in her 71s.  She remains very active.  She was recently started on a statin.  She states that she may be having some minor muscle aches.  She is a non-smoker.  PAST MEDICAL HISTORY    Past Medical History:  Diagnosis Date  . Arthritis   . DJD of shoulder   . Hyperlipidemia   . Insomnia   . Internal hemorrhoids   . Tubular adenoma of colon   . Vitamin D deficiency      FAMILY HISTORY   Family History  Problem Relation Age of Onset  . Hypertension Mother   . Breast cancer Mother   . Heart disease Mother   . Heart attack Father 19       deceased  . Prostate cancer Maternal Uncle     SOCIAL HISTORY:   Social History   Socioeconomic History  . Marital status: Married    Spouse name: Not on file  . Number of children: 2  . Years of education: Not on file  . Highest education level: Not on file  Occupational History  . Not on file  Tobacco Use  . Smoking status: Never Smoker  . Smokeless tobacco: Never Used  Substance and Sexual Activity  . Alcohol use: Yes    Alcohol/week: 0.0 standard drinks    Comment: 3 per week  . Drug use: No  . Sexual activity: Yes  Other Topics Concern  . Not on file  Social History Narrative  . Not on file   Social Determinants of Health   Financial Resource Strain: Not on file  Food Insecurity: Not on file  Transportation Needs: Not on file  Physical Activity: Not on file  Stress: Not on file  Social Connections: Not on file  Intimate Partner  Violence: Not on file    ALLERGIES:    Allergies  Allergen Reactions  . Latex Other (See Comments)    Patient preference   . Penicillins     REACTION: Childhood reaction    CURRENT MEDICATIONS:    Current Outpatient Medications  Medication Sig Dispense Refill  . Ascorbic Acid (VITAMIN C) 100 MG tablet Take 100 mg by mouth 2 (two) times a week.     Marland Kitchen b complex vitamins tablet Take 1 tablet by mouth 2 (two) times a week.     . cholecalciferol (VITAMIN D) 1000 units tablet Take 1,000 Units by mouth daily.    . magnesium oxide (MAG-OX) 400 MG tablet Take 400 mg by mouth daily.    Marland Kitchen omega-3 acid ethyl esters (LOVAZA) 1 G capsule Take 1 g by mouth 2 (two) times a week.      No current facility-administered medications for this visit.    REVIEW OF SYSTEMS:   [X]  denotes positive finding, [ ]  denotes negative finding Cardiac  Comments:  Chest pain or chest pressure:    Shortness of breath upon exertion:    Short of breath when lying flat:    Irregular heart rhythm:  Vascular    Pain in calf, thigh, or hip brought on by ambulation:    Pain in feet at night that wakes you up from your sleep:     Blood clot in your veins:    Leg swelling:         Pulmonary    Oxygen at home:    Productive cough:     Wheezing:         Neurologic    Sudden weakness in arms or legs:     Sudden numbness in arms or legs:     Sudden onset of difficulty speaking or slurred speech:    Temporary loss of vision in one eye:     Problems with dizziness:         Gastrointestinal    Blood in stool:      Vomited blood:         Genitourinary    Burning when urinating:     Blood in urine:        Psychiatric    Major depression:         Hematologic    Bleeding problems:    Problems with blood clotting too easily:        Skin    Rashes or ulcers:        Constitutional    Fever or chills:     PHYSICAL EXAM:   Vitals:   08/03/20 0938  BP: 115/73  Pulse: 65  Resp: 16  Temp:  97.6 F (36.4 C)  TempSrc: Temporal  SpO2: 98%  Weight: 126 lb 3.2 oz (57.2 kg)  Height: 5\' 5"  (1.651 m)    GENERAL: The patient is a well-nourished female, in no acute distress. The vital signs are documented above. CARDIAC: There is a regular rate and rhythm.  PULMONARY: Nonlabored respirations MUSCULOSKELETAL: There are no major deformities or cyanosis. NEUROLOGIC: No focal weakness or paresthesias are detected. SKIN: There are no ulcers or rashes noted. PSYCHIATRIC: The patient has a normal affect.  STUDIES:   I have reviewed the following: Right: Resting right ankle-brachial index is within normal range. No  evidence of significant right lower extremity arterial disease. The right  toe-brachial index is abnormal, borderline normal range.   Left: Resting left ankle-brachial index is within normal range. No  evidence of significant left lower extremity arterial disease. The left  toe-brachial index is abnormal, borderline normal range.    Aorta: Abdominal Aorta: No evidence of an abdominal aortic aneurysm was  visualized.   ASSESSMENT and PLAN   No evidence of abdominal aortic aneurysm was seen on ultrasound.  In addition she had normal ABIs with triphasic waveforms.  I had encouraged her to get her carotid arteries screened with [facility].  She is very concerned about her cardiac risks given her family history.  I have asked her to speak with Dr. Inda Merlin, her primary care physician, regarding additional cardiac testing or referral to cardiology.   Leia Alf, MD, FACS [facility] [phone] Pager [phone]

## 2020-08-21 DIAGNOSIS — H5203 Hypermetropia, bilateral: Secondary | ICD-10-CM | POA: Diagnosis not present

## 2020-08-21 DIAGNOSIS — H2513 Age-related nuclear cataract, bilateral: Secondary | ICD-10-CM | POA: Diagnosis not present

## 2020-08-21 DIAGNOSIS — H25013 Cortical age-related cataract, bilateral: Secondary | ICD-10-CM | POA: Diagnosis not present

## 2020-08-21 DIAGNOSIS — H35362 Drusen (degenerative) of macula, left eye: Secondary | ICD-10-CM | POA: Diagnosis not present

## 2020-09-12 DIAGNOSIS — U071 COVID-19: Secondary | ICD-10-CM | POA: Diagnosis not present

## 2020-09-20 DIAGNOSIS — L740 Miliaria rubra: Secondary | ICD-10-CM | POA: Diagnosis not present

## 2020-09-20 DIAGNOSIS — B354 Tinea corporis: Secondary | ICD-10-CM | POA: Diagnosis not present

## 2020-10-15 DIAGNOSIS — M810 Age-related osteoporosis without current pathological fracture: Secondary | ICD-10-CM | POA: Diagnosis not present

## 2020-10-15 DIAGNOSIS — Z8639 Personal history of other endocrine, nutritional and metabolic disease: Secondary | ICD-10-CM | POA: Diagnosis not present

## 2020-10-15 DIAGNOSIS — I700 Atherosclerosis of aorta: Secondary | ICD-10-CM | POA: Diagnosis not present

## 2020-10-15 DIAGNOSIS — Z1389 Encounter for screening for other disorder: Secondary | ICD-10-CM | POA: Diagnosis not present

## 2020-10-15 DIAGNOSIS — J309 Allergic rhinitis, unspecified: Secondary | ICD-10-CM | POA: Diagnosis not present

## 2020-10-15 DIAGNOSIS — E559 Vitamin D deficiency, unspecified: Secondary | ICD-10-CM | POA: Diagnosis not present

## 2020-10-15 DIAGNOSIS — G479 Sleep disorder, unspecified: Secondary | ICD-10-CM | POA: Diagnosis not present

## 2020-10-15 DIAGNOSIS — M19019 Primary osteoarthritis, unspecified shoulder: Secondary | ICD-10-CM | POA: Diagnosis not present

## 2020-10-15 DIAGNOSIS — Z0000 Encounter for general adult medical examination without abnormal findings: Secondary | ICD-10-CM | POA: Diagnosis not present

## 2020-11-13 DIAGNOSIS — H43812 Vitreous degeneration, left eye: Secondary | ICD-10-CM | POA: Diagnosis not present

## 2020-11-16 DIAGNOSIS — M19011 Primary osteoarthritis, right shoulder: Secondary | ICD-10-CM | POA: Diagnosis not present

## 2020-11-19 ENCOUNTER — Ambulatory Visit (INDEPENDENT_AMBULATORY_CARE_PROVIDER_SITE_OTHER): Payer: Medicare Other | Admitting: Family Medicine

## 2020-11-19 ENCOUNTER — Encounter: Payer: Self-pay | Admitting: Family Medicine

## 2020-11-19 ENCOUNTER — Other Ambulatory Visit: Payer: Self-pay

## 2020-11-19 VITALS — BP 110/62 | HR 65 | Ht 65.0 in | Wt 125.0 lb

## 2020-11-19 DIAGNOSIS — M5136 Other intervertebral disc degeneration, lumbar region: Secondary | ICD-10-CM

## 2020-11-19 DIAGNOSIS — M9904 Segmental and somatic dysfunction of sacral region: Secondary | ICD-10-CM | POA: Diagnosis not present

## 2020-11-19 DIAGNOSIS — M19011 Primary osteoarthritis, right shoulder: Secondary | ICD-10-CM

## 2020-11-19 DIAGNOSIS — M9903 Segmental and somatic dysfunction of lumbar region: Secondary | ICD-10-CM

## 2020-11-19 DIAGNOSIS — M9901 Segmental and somatic dysfunction of cervical region: Secondary | ICD-10-CM | POA: Diagnosis not present

## 2020-11-19 DIAGNOSIS — M9902 Segmental and somatic dysfunction of thoracic region: Secondary | ICD-10-CM

## 2020-11-19 NOTE — Assessment & Plan Note (Signed)
Chronic problem with mild exacerbation.  Patient is still trying to avoid anti-inflammatories but discussed patient can take them if necessary.  Discussed icing regimen and home exercises.  Increase activity slowly.  Patient is still responding fairly well to osteopathic manipulation and follow-up again in 2 months

## 2020-11-19 NOTE — Patient Instructions (Addendum)
Good to see you  Stay active Okay to keep swimming Get you back on the routine See me again in 6 weeks

## 2020-11-19 NOTE — Assessment & Plan Note (Signed)
Chronic problem but still not exacerbated.  Patient wants to hold off any injection at the moment.  Patient wants to avoid any surgical intervention.  We will continue to monitor with patient.

## 2020-11-19 NOTE — Progress Notes (Signed)
  [name] [place] Phone: [phone] Subjective:   Jennifer [name], am serving as a scribe for Dr. Hulan Saas.  I'm seeing this patient by the request  of:  Josetta Huddle, MD  CC: back and neck pain   [id number]  Jennifer [name] is a 75 y.o. female coming in with complaint of back and neck pain. Last seen 04/23/2020. Patient states that she has been having hip pain and the right shoulder neck area have been a little tight and sore.  Patient did see a another provider for the right shoulder pain.  Still holding out avoiding the replacement at this time.  Medications patient has been prescribed: None          Past Medical History:  Diagnosis Date   Arthritis    DJD of shoulder    Hyperlipidemia    Insomnia    Internal hemorrhoids    Tubular adenoma of colon    Vitamin D deficiency     Allergies  Allergen Reactions   Latex Other (See Comments)    Patient preference    Penicillins     REACTION: Childhood reaction     Review of Systems:  No headache, visual changes, nausea, vomiting, diarrhea, constipation, dizziness, abdominal pain, skin rash, fevers, chills, night sweats, weight loss, swollen lymph nodes, body aches, joint swelling, chest pain, shortness of breath, mood changes. POSITIVE muscle aches  Objective  Blood pressure 110/62, pulse 65, height '5\' 5"'$  (1.651 m), weight 125 lb (56.7 kg), SpO2 98 %.   General: No apparent distress alert and oriented x3 mood and affect normal, dressed appropriately.  HEENT: Pupils equal, extraocular movements intact  Respiratory: Patient's speak in full sentences and does not appear short of breath  Cardiovascular: No lower extremity edema, non tender, no erythema  Patient's right shoulder does have significant arthritic changes and some atrophy noted.  Tightness noted in the parascapular region on the right side and minorly on the paraspinal musculature  of the lumbar spine right greater than left.  Osteopathic findings  C2 flexed rotated and side bent right C6 flexed rotated and side bent left T6 extended rotated and side bent left L2 flexed rotated and side bent right Sacrum right on right       Assessment and Plan:  Degenerative disc disease, lumbar Chronic problem with mild exacerbation.  Patient is still trying to avoid anti-inflammatories but discussed patient can take them if necessary.  Discussed icing regimen and home exercises.  Increase activity slowly.  Patient is still responding fairly well to osteopathic manipulation and follow-up again in 2 months   Nonallopathic problems  Decision today to treat with OMT was based on Physical Exam  After verbal consent patient was treated with  ME, FPR techniques in cervical, rib, thoracic, lumbar, and sacral  areas focused on muscle energy on the .  Patient tolerated the procedure well with improvement in symptoms  Patient given exercises, stretches and lifestyle modifications  See medications in patient instructions if given  Patient will follow up in 4-8 weeks      The above documentation has been reviewed and is accurate and complete [name], DO       Note: This dictation was prepared with Dragon dictation along with smaller phrase technology. Any transcriptional errors that result from this process are unintentional.

## 2020-12-11 DIAGNOSIS — M9902 Segmental and somatic dysfunction of thoracic region: Secondary | ICD-10-CM | POA: Diagnosis not present

## 2020-12-11 DIAGNOSIS — M9903 Segmental and somatic dysfunction of lumbar region: Secondary | ICD-10-CM | POA: Diagnosis not present

## 2020-12-11 DIAGNOSIS — M4604 Spinal enthesopathy, thoracic region: Secondary | ICD-10-CM | POA: Diagnosis not present

## 2020-12-11 DIAGNOSIS — M9901 Segmental and somatic dysfunction of cervical region: Secondary | ICD-10-CM | POA: Diagnosis not present

## 2020-12-16 ENCOUNTER — Ambulatory Visit: Payer: Medicare Other | Admitting: Sports Medicine

## 2020-12-18 DIAGNOSIS — M9902 Segmental and somatic dysfunction of thoracic region: Secondary | ICD-10-CM | POA: Diagnosis not present

## 2020-12-18 DIAGNOSIS — M9903 Segmental and somatic dysfunction of lumbar region: Secondary | ICD-10-CM | POA: Diagnosis not present

## 2020-12-18 DIAGNOSIS — M4604 Spinal enthesopathy, thoracic region: Secondary | ICD-10-CM | POA: Diagnosis not present

## 2020-12-18 DIAGNOSIS — M9901 Segmental and somatic dysfunction of cervical region: Secondary | ICD-10-CM | POA: Diagnosis not present

## 2020-12-21 ENCOUNTER — Encounter: Payer: Self-pay | Admitting: Internal Medicine

## 2020-12-24 DIAGNOSIS — Z23 Encounter for immunization: Secondary | ICD-10-CM | POA: Diagnosis not present

## 2020-12-25 DIAGNOSIS — M9901 Segmental and somatic dysfunction of cervical region: Secondary | ICD-10-CM | POA: Diagnosis not present

## 2020-12-25 DIAGNOSIS — M9903 Segmental and somatic dysfunction of lumbar region: Secondary | ICD-10-CM | POA: Diagnosis not present

## 2020-12-25 DIAGNOSIS — M4604 Spinal enthesopathy, thoracic region: Secondary | ICD-10-CM | POA: Diagnosis not present

## 2020-12-25 DIAGNOSIS — M9902 Segmental and somatic dysfunction of thoracic region: Secondary | ICD-10-CM | POA: Diagnosis not present

## 2020-12-30 NOTE — Progress Notes (Signed)
Jennifer [name] [facility] [address] [place] Phone: [phone] Subjective:   [name], am serving as a scribe for Dr. Hulan Saas. This visit occurred during the SARS-CoV-2 public health emergency.  Safety protocols were in place, including screening questions prior to the visit, additional usage of staff PPE, and extensive cleaning of exam room while observing appropriate contact time as indicated for disinfecting solutions.   I'm seeing this patient by the request  of:  Josetta Huddle, MD  CC: back and neck pain   [id number]  Jennifer [name] is a 75 y.o. female coming in with complaint of back and neck pain. OMT on 11/19/2020. Also seen for right shoulder pain. Chronic problem but still not exacerbated.  Patient wants to hold off any injection at the moment.  Patient wants to avoid any surgical intervention.  We will continue to monitor with patient.   Patient states her hip is doing better, but not fully pain free. He shoulder pain is still very apparent, but maybe getting slightly better. All other neck and back pains remain the same. No new complaints.  Medications patient has been prescribed: None        Reviewed prior external information including notes and imaging from previsou exam, outside providers and external EMR if available.   As well as notes that were available from care everywhere and other healthcare systems.  Past medical history, social, surgical and family history all reviewed in electronic medical record.  No pertanent information unless stated regarding to the chief complaint.   Past Medical History:  Diagnosis Date   Arthritis    DJD of shoulder    Hyperlipidemia    Insomnia    Internal hemorrhoids    Tubular adenoma of colon    Vitamin D deficiency     Allergies  Allergen Reactions   Latex Other (See Comments)    Patient preference    Penicillins     REACTION: Childhood reaction     Review  of Systems:  No headache, visual changes, nausea, vomiting, diarrhea, constipation, dizziness, abdominal pain, skin rash, fevers, chills, night sweats, weight loss, swollen lymph nodes, body aches, joint swelling, chest pain, shortness of breath, mood changes. POSITIVE muscle aches  Objective  Blood pressure 120/64, pulse 79, weight 125 lb (56.7 kg), SpO2 98 %.   General: No apparent distress alert and oriented x3 mood and affect normal, dressed appropriately.  HEENT: Pupils equal, extraocular movements intact  Respiratory: Patient's speak in full sentences and does not appear short of breath  Cardiovascular: No lower extremity edema, non tender, no erythema  Arthritic changes of the right shoulder noted.  Patient continues to have tightness of the paraspinal musculature of the lumbar spine.  Seems to be more right greater than left.  Patient does have scoliosis noted.  Osteopathic findings  T3 extended rotated and side bent left T9 extended rotated and side bent left L2 flexed rotated and side bent right Sacrum right on right       Assessment and Plan:  Degenerative disc disease, lumbar Degenerative disease but seems to be doing relatively well.  Discussed icing regimen and home exercises.  Discussed avoiding certain activities.  Patient will be traveling internationally.  We discussed which activities to do which wants to avoid.  Patient will increase activity slowly.  Follow-up with me again in 6 to 8 weeks   Nonallopathic problems  Decision today to treat with OMT was based on Physical Exam  After verbal consent patient was treated with HVLA, ME, FPR techniques in  thoracic, lumbar, and sacral  areas  Patient tolerated the procedure well with improvement in symptoms  Patient given exercises, stretches and lifestyle modifications  See medications in patient instructions if given  Patient will follow up in 4-8 weeks      The above documentation has been reviewed and is  accurate and complete [name], DO        Note: This dictation was prepared with Dragon dictation along with smaller phrase technology. Any transcriptional errors that result from this process are unintentional.

## 2020-12-31 ENCOUNTER — Ambulatory Visit (INDEPENDENT_AMBULATORY_CARE_PROVIDER_SITE_OTHER): Payer: Medicare Other | Admitting: Family Medicine

## 2020-12-31 ENCOUNTER — Encounter: Payer: Self-pay | Admitting: Family Medicine

## 2020-12-31 ENCOUNTER — Other Ambulatory Visit: Payer: Self-pay

## 2020-12-31 VITALS — BP 120/64 | HR 79 | Wt 125.0 lb

## 2020-12-31 DIAGNOSIS — M9903 Segmental and somatic dysfunction of lumbar region: Secondary | ICD-10-CM | POA: Diagnosis not present

## 2020-12-31 DIAGNOSIS — M9902 Segmental and somatic dysfunction of thoracic region: Secondary | ICD-10-CM

## 2020-12-31 DIAGNOSIS — M5136 Other intervertebral disc degeneration, lumbar region: Secondary | ICD-10-CM

## 2020-12-31 DIAGNOSIS — M51369 Other intervertebral disc degeneration, lumbar region without mention of lumbar back pain or lower extremity pain: Secondary | ICD-10-CM

## 2020-12-31 DIAGNOSIS — M9904 Segmental and somatic dysfunction of sacral region: Secondary | ICD-10-CM | POA: Diagnosis not present

## 2020-12-31 NOTE — Assessment & Plan Note (Signed)
Degenerative disease but seems to be doing relatively well.  Discussed icing regimen and home exercises.  Discussed avoiding certain activities.  Patient will be traveling internationally.  We discussed which activities to do which wants to avoid.  Patient will increase activity slowly.  Follow-up with me again in 6 to 8 weeks

## 2020-12-31 NOTE — Patient Instructions (Signed)
Good to see you! Have a wonderful trip Continue the exercises See you again in 6-7 weeks

## 2021-01-03 DIAGNOSIS — Z23 Encounter for immunization: Secondary | ICD-10-CM | POA: Diagnosis not present

## 2021-01-04 ENCOUNTER — Telehealth: Payer: Self-pay | Admitting: Internal Medicine

## 2021-01-04 NOTE — Telephone Encounter (Signed)
Dr. Rachelle Hora called on behalf of the patient requesting her to establish care with Dr. Quay Burow.   Please advise.

## 2021-01-04 NOTE — Telephone Encounter (Signed)
Yes, I will accept

## 2021-01-07 DIAGNOSIS — H43812 Vitreous degeneration, left eye: Secondary | ICD-10-CM | POA: Diagnosis not present

## 2021-01-27 DIAGNOSIS — M9903 Segmental and somatic dysfunction of lumbar region: Secondary | ICD-10-CM | POA: Diagnosis not present

## 2021-01-27 DIAGNOSIS — M9902 Segmental and somatic dysfunction of thoracic region: Secondary | ICD-10-CM | POA: Diagnosis not present

## 2021-01-27 DIAGNOSIS — M4604 Spinal enthesopathy, thoracic region: Secondary | ICD-10-CM | POA: Diagnosis not present

## 2021-01-27 DIAGNOSIS — M9901 Segmental and somatic dysfunction of cervical region: Secondary | ICD-10-CM | POA: Diagnosis not present

## 2021-02-05 DIAGNOSIS — M9901 Segmental and somatic dysfunction of cervical region: Secondary | ICD-10-CM | POA: Diagnosis not present

## 2021-02-05 DIAGNOSIS — M9902 Segmental and somatic dysfunction of thoracic region: Secondary | ICD-10-CM | POA: Diagnosis not present

## 2021-02-05 DIAGNOSIS — M4604 Spinal enthesopathy, thoracic region: Secondary | ICD-10-CM | POA: Diagnosis not present

## 2021-02-05 DIAGNOSIS — M9903 Segmental and somatic dysfunction of lumbar region: Secondary | ICD-10-CM | POA: Diagnosis not present

## 2021-02-16 ENCOUNTER — Ambulatory Visit: Payer: Medicare Other | Admitting: Family Medicine

## 2021-02-25 DIAGNOSIS — M9902 Segmental and somatic dysfunction of thoracic region: Secondary | ICD-10-CM | POA: Diagnosis not present

## 2021-02-25 DIAGNOSIS — M4604 Spinal enthesopathy, thoracic region: Secondary | ICD-10-CM | POA: Diagnosis not present

## 2021-02-25 DIAGNOSIS — M9901 Segmental and somatic dysfunction of cervical region: Secondary | ICD-10-CM | POA: Diagnosis not present

## 2021-02-25 DIAGNOSIS — M9903 Segmental and somatic dysfunction of lumbar region: Secondary | ICD-10-CM | POA: Diagnosis not present

## 2021-02-28 DIAGNOSIS — I700 Atherosclerosis of aorta: Secondary | ICD-10-CM | POA: Insufficient documentation

## 2021-02-28 NOTE — Progress Notes (Signed)
Subjective:    Patient ID: Jennifer [name], female    DOB: [birth year], 75 y.o.   MRN: [id number]  This visit occurred during the SARS-CoV-2 public health emergency.  Safety protocols were in place, including screening questions prior to the visit, additional usage of staff PPE, and extensive cleaning of exam room while observing appropriate contact time as indicated for disinfecting solutions.     HPI She is here to establish with a new pcp.  The patient is here for follow up of their chronic medical problems, including HLD  Chronic PND from allergies.  Has not wanted to take any medication for it.  Does have some associated enlarged lymph nodes and a cough intermittently.  R shoulder issues - dec ROM but no pain - dr Tamala Julian  Active - swims two/week, walking, weights 2/week, some yoga.      Medications and allergies reviewed with patient and updated if appropriate.  Patient Active Problem List   Diagnosis Date Noted   Vitamin D deficiency 03/02/2021   Hardening of the aorta (main artery of the heart) ([place]) 02/28/2021   Osteoporosis 01/30/2020   Arthritis of carpometacarpal (CMC) joint of right thumb 10/08/2019   Degenerative disc disease, lumbar 06/18/2019   Abnormality of gait 12/01/2016   Nonallopathic lesion of thoracic region 09/02/2016   Nonallopathic lesion of lumbosacral region 09/02/2016   Nonallopathic lesion of sacral region 09/02/2016   Hallux rigidus of both feet 01/12/2016   Chronic tonsillar hypertrophy 06/16/2010   Degenerative joint disease of right shoulder-Dr. Tamala Julian 12/29/2009   Allergic rhinitis 09/04/2009   CONSTIPATION, INTERMITTENT 09/04/2009   Hyperlipidemia 12/11/2007    Current Outpatient Medications on File Prior to Visit  Medication Sig Dispense Refill   Ascorbic Acid (VITAMIN C) 100 MG tablet Take 100 mg by mouth 2 (two) times a week.      b complex vitamins tablet Take 1 tablet by mouth 2 (two) times a week.      cholecalciferol  (VITAMIN D) 1000 units tablet Take 1,000 Units by mouth daily.     magnesium oxide (MAG-OX) 400 MG tablet Take 400 mg by mouth daily.     omega-3 acid ethyl esters (LOVAZA) 1 G capsule Take 1 g by mouth 2 (two) times a week.      rosuvastatin (CRESTOR) 5 MG tablet Take 5 mg by mouth 3 (three) times a week.     No current facility-administered medications on file prior to visit.    Past Medical History:  Diagnosis Date   Arthritis    DJD of shoulder    Hyperlipidemia    Insomnia    Internal hemorrhoids    Tubular adenoma of colon    Vitamin D deficiency     Past Surgical History:  Procedure Laterality Date   FOOT SURGERY Left    FOOT SURGERY Right    HIATAL HERNIA REPAIR  1954   age 59   OOPHORECTOMY     SHOULDER ARTHROSCOPY Right    TOTAL ABDOMINAL HYSTERECTOMY W/ BILATERAL SALPINGOOPHORECTOMY  2007    Social History   Socioeconomic History   Marital status: Married    Spouse name: Not on file   Number of children: 2   Years of education: Not on file   Highest education level: Not on file  Occupational History   Not on file  Tobacco Use   Smoking status: Never   Smokeless tobacco: Never  Substance and Sexual Activity   Alcohol use: Yes  Alcohol/week: 0.0 standard drinks    Comment: 3 per week   Drug use: No   Sexual activity: Yes  Other Topics Concern   Not on file  Social History Narrative   Not on file   Social Determinants of Health   Financial Resource Strain: Not on file  Food Insecurity: Not on file  Transportation Needs: Not on file  Physical Activity: Not on file  Stress: Not on file  Social Connections: Not on file    Family History  Problem Relation Age of Onset   Hypertension Mother    Breast cancer Mother    Heart disease Mother    Heart disease Father    Heart attack Father 76       deceased   Prostate cancer Maternal Uncle     Review of Systems  Constitutional:  Negative for chills, fatigue and fever.  HENT:  Positive for  hearing loss (hearing aids) and postnasal drip.   Respiratory:  Negative for cough, shortness of breath and wheezing.   Cardiovascular:  Negative for chest pain, palpitations and leg swelling.  Gastrointestinal:  Positive for constipation. Negative for abdominal pain, blood in stool, diarrhea and nausea.       No gerd  Genitourinary:  Negative for dysuria and hematuria.  Musculoskeletal:  Positive for arthralgias.  Skin:  Negative for rash.  Neurological:  Negative for dizziness, light-headedness and headaches.  Psychiatric/Behavioral:  Positive for sleep disturbance (at times). Negative for dysphoric mood. The patient is not nervous/anxious.       Objective:   Vitals:   03/02/21 0924  BP: 118/62  Pulse: 68  Temp: 98 F (36.7 C)  SpO2: 100%   BP Readings from Last 3 Encounters:  03/02/21 118/62  12/31/20 120/64  11/19/20 110/62   Wt Readings from Last 3 Encounters:  03/02/21 129 lb (58.5 kg)  12/31/20 125 lb (56.7 kg)  11/19/20 125 lb (56.7 kg)   Body mass index is 21.47 kg/m.   Physical Exam    Constitutional: Appears well-developed and well-nourished. No distress.  HENT:  Head: Normocephalic and atraumatic.  Neck: Neck supple. No tracheal deviation present. No thyromegaly present.  No cervical lymphadenopathy Cardiovascular: Normal rate, regular rhythm and normal heart sounds.   No murmur heard. No carotid bruit .  No edema Pulmonary/Chest: Effort normal and breath sounds normal. No respiratory distress. No has no wheezes. No rales.  Abdomen: soft, nt, nd Skin: Skin is warm and dry. Not diaphoretic.  Psychiatric: Normal mood and affect. Behavior is normal.      Assessment & Plan:    See Problem List for Assessment and Plan of chronic medical problems.

## 2021-02-28 NOTE — Patient Instructions (Addendum)
      Medications changes include :  none      Please followup in 1 yea

## 2021-03-02 ENCOUNTER — Other Ambulatory Visit: Payer: Self-pay

## 2021-03-02 ENCOUNTER — Encounter: Payer: Self-pay | Admitting: Internal Medicine

## 2021-03-02 ENCOUNTER — Ambulatory Visit (INDEPENDENT_AMBULATORY_CARE_PROVIDER_SITE_OTHER): Payer: Medicare Other | Admitting: Internal Medicine

## 2021-03-02 DIAGNOSIS — J309 Allergic rhinitis, unspecified: Secondary | ICD-10-CM

## 2021-03-02 DIAGNOSIS — Z9079 Acquired absence of other genital organ(s): Secondary | ICD-10-CM | POA: Diagnosis not present

## 2021-03-02 DIAGNOSIS — Z90710 Acquired absence of both cervix and uterus: Secondary | ICD-10-CM

## 2021-03-02 DIAGNOSIS — E559 Vitamin D deficiency, unspecified: Secondary | ICD-10-CM | POA: Diagnosis not present

## 2021-03-02 DIAGNOSIS — E7849 Other hyperlipidemia: Secondary | ICD-10-CM | POA: Diagnosis not present

## 2021-03-02 DIAGNOSIS — Z90722 Acquired absence of ovaries, bilateral: Secondary | ICD-10-CM

## 2021-03-02 DIAGNOSIS — M810 Age-related osteoporosis without current pathological fracture: Secondary | ICD-10-CM

## 2021-03-02 NOTE — Assessment & Plan Note (Addendum)
Chronic DEXA up-to-date-2021-LFN -2.8 Taking vitamin D daily Advised taking some calcium She is exercising regularly Repeat DEXA 2023

## 2021-03-02 NOTE — Assessment & Plan Note (Addendum)
Chronic Continue Crestor 5 mg 3 times weekly-she will let me know when she needs a refill and I will start prescribing She is exercising regularly

## 2021-03-02 NOTE — Assessment & Plan Note (Signed)
Chronic Taking vitamin D daily-continue  

## 2021-03-02 NOTE — Assessment & Plan Note (Signed)
Chronic postnasal drainage with associated cough Has associated cervical lymphadenopathy Would prefer not to take any allergy medications

## 2021-03-19 DIAGNOSIS — M9903 Segmental and somatic dysfunction of lumbar region: Secondary | ICD-10-CM | POA: Diagnosis not present

## 2021-03-19 DIAGNOSIS — M9902 Segmental and somatic dysfunction of thoracic region: Secondary | ICD-10-CM | POA: Diagnosis not present

## 2021-03-19 DIAGNOSIS — M9901 Segmental and somatic dysfunction of cervical region: Secondary | ICD-10-CM | POA: Diagnosis not present

## 2021-03-19 DIAGNOSIS — M4604 Spinal enthesopathy, thoracic region: Secondary | ICD-10-CM | POA: Diagnosis not present

## 2021-03-22 ENCOUNTER — Other Ambulatory Visit: Payer: Self-pay

## 2021-03-22 ENCOUNTER — Ambulatory Visit (AMBULATORY_SURGERY_CENTER): Payer: Medicare Other | Admitting: *Deleted

## 2021-03-22 VITALS — Ht 65.0 in | Wt 124.0 lb

## 2021-03-22 DIAGNOSIS — Z86010 Personal history of colonic polyps: Secondary | ICD-10-CM

## 2021-03-22 MED ORDER — PLENVU 140 G PO SOLR: 1.0000 | ORAL | 0 refills | Status: DC

## 2021-03-22 NOTE — Progress Notes (Signed)
PV completed over the phone. Pt verified name, DOB, address and insurance during PV today.  Pt mailed instruction packet with copy of consent form to read and not return, and instructions.  Plenvu coupon mailed in packet.  Pt encouraged to call with questions or issues.  If pt has My chart, procedure instructions sent via My Chart  aLSO EMAILED  INSTRUCTIONS PER P[T REQUEST TO  [email or web address]  No egg or soy allergy known to patient  No issues known to pt with past sedation with any surgeries or procedures Patient denies ever being told they had issues or difficulty with intubation  No FH of Malignant Hyperthermia Pt is not on diet pills Pt is not on  home 02  Pt is not on blood thinners  Pt denies issues with constipation chronic- she has to be aware of this issue- Magnesium helps  No A fib or A flutter  Pt is fully vaccinated  for Covid   Plenvu  Coupon given to pt in PV today , Code to Pharmacy and  NO PA's for preps discussed with pt In PV today  Discussed with pt there will be an out-of-pocket cost for prep and that varies from $0 to 70 +  dollars - pt verbalized understanding   Due to the COVID-19 pandemic we are asking patients to follow certain guidelines in PV and the [place]   Pt aware of COVID protocols and [facility] guidelines

## 2021-03-25 ENCOUNTER — Telehealth: Payer: Self-pay | Admitting: Internal Medicine

## 2021-03-25 NOTE — Telephone Encounter (Signed)
Patient called.  She went to pick up prep at pharmacy, but says the pharmacy did not have the discount coupon information.  Can you please supply the pharmacy with the coupon code?  Patient said the prescription was over $100 when she went to pick it up.  Thank you.

## 2021-03-25 NOTE — Telephone Encounter (Signed)
Pharmacy states the patient already picked up plenvu for $60.  Spoke with the patient she does have the prep.

## 2021-03-31 NOTE — Progress Notes (Signed)
[place] Phone: [phone] Subjective:   [name], am serving as a scribe for Dr. Hulan Saas.  This visit occurred during the SARS-CoV-2 public health emergency.  Safety protocols were in place, including screening questions prior to the visit, additional usage of staff PPE, and extensive cleaning of exam room while observing appropriate contact time as indicated for disinfecting solutions.   I'm seeing this patient by the request  of:  [name], MD  CC: Neck and back pain follow-up  EQA:[id number]  Petrona Wyeth is a 75 y.o. female coming in with complaint of back and neck pain. OMT on 12/31/2020. Patient states that she has had intermittent pain in B hips since last visit. States that R hip has started to bother her. Pain begins in lower back and radiates into hip and R quad.  States that it does seem to be stable overall.  Pain in 4th toes on both feet that feels like a "shock." Unable to walk barefoot. Is working on exercises for her feet.   Medications patient has been prescribed: None  Taking:         Past Medical History:  Diagnosis Date   Allergy    seasonal   Anemia    Arthritis    Cataract    forming   DJD of shoulder    Hyperlipidemia    Insomnia    Internal hemorrhoids    Tubular adenoma of colon    Vitamin D deficiency     Allergies  Allergen Reactions   Latex Other (See Comments)    Patient preference  Sensitivity    Penicillins     REACTION: Childhood reaction     Review of Systems:  No headache, visual changes, nausea, vomiting, diarrhea, constipation, dizziness, abdominal pain, skin rash, fevers, chills, night sweats, weight loss, swollen lymph nodes, body aches, joint swelling, chest pain, shortness of breath, mood changes. POSITIVE muscle aches  Objective  Blood pressure 128/62, pulse 70, height 5\' 5"  (1.651 m), weight 127 lb (57.6 kg), SpO2 98  %.   General: No apparent distress alert and oriented x3 mood and affect normal, dressed appropriately.  HEENT: Pupils equal, extraocular movements intact  Respiratory: Patient's speak in full sentences and does not appear short of breath  Cardiovascular: No lower extremity edema, non tender, no erythema  Patient does have some scoliosis of the back.  Loss of lordosis of the back.  Patient does have tightness noted right greater than left of the sacroiliac joint.  Negative straight leg test noted today.  Osteopathic findings  C4 flexed rotated and side bent left T3 extended rotated and side bent right inhaled rib L2 flexed rotated and side bent right L5 flexed rotated and side bent left Sacrum right on right       Assessment and Plan:  Degenerative disc disease, lumbar Significant degenerative disc disease.  Discussed icing regimen and home exercises, discussed posture and alignment.  Patient is seeing a Restaurant manager, fast food.  Did discuss what to potentially have been focused on.  Increase activity slowly.  Follow-up again in 6 to 8 weeks  Hallux rigidus of both feet Discussed shoes that I think will be beneficial in the house.  Avoid being barefoot.  Follow-up again in 6 to 8 weeks   Nonallopathic problems  Decision today to treat with OMT was based on Physical Exam  After verbal consent patient was treated with HVLA, ME, FPR techniques  in cervical, rib, thoracic, lumbar, and sacral  areas  Patient tolerated the procedure well with improvement in symptoms  Patient given exercises, stretches and lifestyle modifications  See medications in patient instructions if given  Patient will follow up in 4-8 weeks      The above documentation has been reviewed and is accurate and complete [name], DO        Note: This dictation was prepared with Dragon dictation along with smaller phrase technology. Any transcriptional errors that result from this process are unintentional.

## 2021-04-01 ENCOUNTER — Encounter: Payer: Self-pay | Admitting: Family Medicine

## 2021-04-01 ENCOUNTER — Other Ambulatory Visit: Payer: Self-pay

## 2021-04-01 ENCOUNTER — Ambulatory Visit (INDEPENDENT_AMBULATORY_CARE_PROVIDER_SITE_OTHER): Payer: Medicare Other | Admitting: Family Medicine

## 2021-04-01 VITALS — BP 128/62 | HR 70 | Ht 65.0 in | Wt 127.0 lb

## 2021-04-01 DIAGNOSIS — M9903 Segmental and somatic dysfunction of lumbar region: Secondary | ICD-10-CM

## 2021-04-01 DIAGNOSIS — M2021 Hallux rigidus, right foot: Secondary | ICD-10-CM

## 2021-04-01 DIAGNOSIS — M9901 Segmental and somatic dysfunction of cervical region: Secondary | ICD-10-CM | POA: Diagnosis not present

## 2021-04-01 DIAGNOSIS — M5136 Other intervertebral disc degeneration, lumbar region: Secondary | ICD-10-CM

## 2021-04-01 DIAGNOSIS — M2022 Hallux rigidus, left foot: Secondary | ICD-10-CM | POA: Diagnosis not present

## 2021-04-01 DIAGNOSIS — M9902 Segmental and somatic dysfunction of thoracic region: Secondary | ICD-10-CM

## 2021-04-01 DIAGNOSIS — M9904 Segmental and somatic dysfunction of sacral region: Secondary | ICD-10-CM

## 2021-04-01 DIAGNOSIS — M9908 Segmental and somatic dysfunction of rib cage: Secondary | ICD-10-CM | POA: Diagnosis not present

## 2021-04-01 NOTE — Patient Instructions (Addendum)
HOKA or OOFOS recovery sandals in the house Happy Holidays Tell chiro to focus on SI joint and hip flexors See me again in 6-8 weeks

## 2021-04-01 NOTE — Assessment & Plan Note (Signed)
Significant degenerative disc disease.  Discussed icing regimen and home exercises, discussed posture and alignment.  Patient is seeing a Restaurant manager, fast food.  Did discuss what to potentially have been focused on.  Increase activity slowly.  Follow-up again in 6 to 8 weeks

## 2021-04-01 NOTE — Assessment & Plan Note (Signed)
Discussed shoes that I think will be beneficial in the house.  Avoid being barefoot.  Follow-up again in 6 to 8 weeks

## 2021-04-05 ENCOUNTER — Ambulatory Visit (AMBULATORY_SURGERY_CENTER): Payer: Medicare Other | Admitting: Internal Medicine

## 2021-04-05 ENCOUNTER — Encounter: Payer: Self-pay | Admitting: Internal Medicine

## 2021-04-05 VITALS — BP 121/60 | HR 63 | Temp 96.9°F | Resp 16 | Ht 65.0 in | Wt 123.0 lb

## 2021-04-05 DIAGNOSIS — D120 Benign neoplasm of cecum: Secondary | ICD-10-CM

## 2021-04-05 DIAGNOSIS — Z86010 Personal history of colonic polyps: Secondary | ICD-10-CM

## 2021-04-05 DIAGNOSIS — E785 Hyperlipidemia, unspecified: Secondary | ICD-10-CM | POA: Diagnosis not present

## 2021-04-05 MED ORDER — SODIUM CHLORIDE 0.9 % IV SOLN: 500.0000 mL | Freq: Once | INTRAVENOUS | Status: DC

## 2021-04-05 NOTE — Progress Notes (Signed)
Report to PACU, RN, vss, BBS= Clear.  

## 2021-04-05 NOTE — Patient Instructions (Signed)
Please read handouts provided. Continue present medications. Await pathology results.   YOU HAD AN ENDOSCOPIC PROCEDURE TODAY AT THE [facility]:   Refer to the procedure report that was given to you for any specific questions about what was found during the examination.  If the procedure report does not answer your questions, please call your gastroenterologist to clarify.  If you requested that your care partner not be given the details of your procedure findings, then the procedure report has been included in a sealed envelope for you to review at your convenience later.  YOU SHOULD EXPECT: Some feelings of bloating in the abdomen. Passage of more gas than usual.  Walking can help get rid of the air that was put into your GI tract during the procedure and reduce the bloating. If you had a lower endoscopy (such as a colonoscopy or flexible sigmoidoscopy) you may notice spotting of blood in your stool or on the toilet paper. If you underwent a bowel prep for your procedure, you may not have a normal bowel movement for a few days.  Please Note:  You might notice some irritation and congestion in your nose or some drainage.  This is from the oxygen used during your procedure.  There is no need for concern and it should clear up in a day or so.  SYMPTOMS TO REPORT IMMEDIATELY:  Following lower endoscopy (colonoscopy or flexible sigmoidoscopy):  Excessive amounts of blood in the stool  Significant tenderness or worsening of abdominal pains  Swelling of the abdomen that is new, acute  Fever of 100F or higher   For urgent or emergent issues, a gastroenterologist can be reached at any hour by calling [phone]. Do not use MyChart messaging for urgent concerns.    DIET:  We do recommend a small meal at first, but then you may proceed to your regular diet.  Drink plenty of fluids but you should avoid alcoholic beverages for 24 hours.  ACTIVITY:  You should plan to take it easy  for the rest of today and you should NOT DRIVE or use heavy machinery until tomorrow (because of the sedation medicines used during the test).    FOLLOW UP: Our staff will call the number listed on your records 48-72 hours following your procedure to check on you and address any questions or concerns that you may have regarding the information given to you following your procedure. If we do not reach you, we will leave a message.  We will attempt to reach you two times.  During this call, we will ask if you have developed any symptoms of COVID 19. If you develop any symptoms (ie: fever, flu-like symptoms, shortness of breath, cough etc.) before then, please call [phone].  If you test positive for Covid 19 in the 2 weeks post procedure, please call and report this information to us.    If any biopsies were taken you will be contacted by phone or by letter within the next 1-3 weeks.  Please call us at [phone] if you have not heard about the biopsies in 3 weeks.    SIGNATURES/CONFIDENTIALITY: You and/or your care partner have signed paperwork which will be entered into your electronic medical record.  These signatures attest to the fact that that the information above on your After Visit Summary has been reviewed and is understood.  Full responsibility of the confidentiality of this discharge information lies with you and/or your care-partner.  

## 2021-04-05 NOTE — Progress Notes (Signed)
GASTROENTEROLOGY PROCEDURE H&P NOTE   Primary Care Physician: [name], MD    Reason for Procedure:  Personal history of adenoma of the colon  Plan:    Colonoscopy  Patient is appropriate for endoscopic procedure(s) in the ambulatory ([facility]) setting.  The nature of the procedure, as well as the risks, benefits, and alternatives were carefully and thoroughly reviewed with the patient. Ample time for discussion and questions allowed. The patient understood, was satisfied, and agreed to proceed.     HPI: Jennifer [name] is a 75 y.o. female who presents for surveillance colonoscopy.  Medical history as below.  Tolerated the prep.  No recent chest pain or shortness of breath.  No abdominal pain today.  Past Medical History:  Diagnosis Date   Allergy    seasonal   Anemia    Arthritis    Cataract    forming   DJD of shoulder    Hyperlipidemia    Insomnia    Internal hemorrhoids    Tubular adenoma of colon    Vitamin D deficiency     Past Surgical History:  Procedure Laterality Date   COLONOSCOPY     FOOT SURGERY Left    FOOT SURGERY Right    HIATAL HERNIA REPAIR  04/18/1952   age 61   OOPHORECTOMY     POLYPECTOMY     SHOULDER ARTHROSCOPY Right    TOTAL ABDOMINAL HYSTERECTOMY W/ BILATERAL SALPINGOOPHORECTOMY  04/18/2005   WISDOM TOOTH EXTRACTION     as teenager    Prior to Admission medications   Medication Sig Start Date End Date Taking? Authorizing Provider  b complex vitamins tablet Take 1 tablet by mouth 2 (two) times a week.    Yes [provider]  Calcium Carb-Cholecalciferol (CALCIUM 500 + D3 PO) Take by mouth.   Yes [provider]  cholecalciferol (VITAMIN D) 1000 units tablet Take 1,000 Units by mouth daily.   Yes [provider]  magnesium oxide (MAG-OX) 400 MG tablet Take 400 mg by mouth daily.   Yes [provider]  omega-3 acid ethyl esters (LOVAZA) 1 G capsule Take 1 g by mouth 2 (two) times a week.     Yes [provider]  rosuvastatin (CRESTOR) 5 MG tablet Take 5 mg by mouth 3 (three) times a week. 11/06/20  Yes [provider]  Ascorbic Acid (VITAMIN C) 100 MG tablet Take 100 mg by mouth 2 (two) times a week.     [provider]    Current Outpatient Medications  Medication Sig Dispense Refill   b complex vitamins tablet Take 1 tablet by mouth 2 (two) times a week.      Calcium Carb-Cholecalciferol (CALCIUM 500 + D3 PO) Take by mouth.     cholecalciferol (VITAMIN D) 1000 units tablet Take 1,000 Units by mouth daily.     magnesium oxide (MAG-OX) 400 MG tablet Take 400 mg by mouth daily.     omega-3 acid ethyl esters (LOVAZA) 1 G capsule Take 1 g by mouth 2 (two) times a week.      rosuvastatin (CRESTOR) 5 MG tablet Take 5 mg by mouth 3 (three) times a week.     Ascorbic Acid (VITAMIN C) 100 MG tablet Take 100 mg by mouth 2 (two) times a week.      Current Facility-Administered Medications  Medication Dose Route Frequency Provider Last Rate Last Admin   0.9 %  sodium chloride infusion  500 mL Intravenous Once Kenlea Woodell, Ulice Dash  M, MD        Allergies as of 04/05/2021 - Review Complete 04/05/2021  Allergen Reaction Noted   Latex Other (See Comments) 03/12/2013   Penicillins      Family History  Problem Relation Age of Onset   Hypertension Mother    Breast cancer Mother    Heart disease Mother    Heart disease Father    Heart attack Father 55       deceased   Prostate cancer Maternal Uncle    Colon cancer Neg Hx    Colon polyps Neg Hx    Esophageal cancer Neg Hx    Rectal cancer Neg Hx    Stomach cancer Neg Hx     Social History   Socioeconomic History   Marital status: Married    Spouse name: Not on file   Number of children: 2   Years of education: Not on file   Highest education level: Not on file  Occupational History   Not on file  Tobacco Use   Smoking status: Never   Smokeless tobacco: Never  Substance and Sexual Activity   Alcohol  use: Yes    Alcohol/week: 0.0 standard drinks    Comment: 3 per week   Drug use: No   Sexual activity: Yes  Other Topics Concern   Not on file  Social History Narrative   Not on file   Social Determinants of Health   Financial Resource Strain: Not on file  Food Insecurity: Not on file  Transportation Needs: Not on file  Physical Activity: Not on file  Stress: Not on file  Social Connections: Not on file  Intimate Partner Violence: Not on file    Physical Exam: Vital signs in last 24 hours: @BP  126/64    Pulse 69    Temp (!) 96.9 F (36.1 C) (Temporal)    Ht 5\' 5"  (1.651 m)    Wt 123 lb (55.8 kg)    SpO2 98%    BMI 20.47 kg/m  GEN: NAD EYE: Sclerae anicteric ENT: MMM CV: Non-tachycardic Pulm: CTA b/l GI: Soft, NT/ND NEURO:  Alert & Oriented x 3   Zenovia Jarred, MD [facility]  04/05/2021 9:32 AM

## 2021-04-05 NOTE — Progress Notes (Signed)
GASTROENTEROLOGY PROCEDURE H&P NOTE   Primary Care Physician: [name], MD    Reason for Procedure:  History of adenomatous colon polyp  Plan:    Colonoscopy  Patient is appropriate for endoscopic procedure(s) in the ambulatory ([facility]) setting.  The nature of the procedure, as well as the risks, benefits, and alternatives were carefully and thoroughly reviewed with the patient. Ample time for discussion and questions allowed. The patient understood, was satisfied, and agreed to proceed.     HPI: Jennifer [name] is a 75 y.o. female who presents for colonoscopy.  No recent chest pain or shortness of breath.  No abdominal pain.  Medical history as below  Still with some rectal prolapse symptom and intermittent painless red blood per rectum  Past Medical History:  Diagnosis Date   Allergy    seasonal   Anemia    Arthritis    Cataract    forming   DJD of shoulder    Hyperlipidemia    Insomnia    Internal hemorrhoids    Tubular adenoma of colon    Vitamin D deficiency     Past Surgical History:  Procedure Laterality Date   COLONOSCOPY     FOOT SURGERY Left    FOOT SURGERY Right    HIATAL HERNIA REPAIR  04/18/1952   age 52   OOPHORECTOMY     POLYPECTOMY     SHOULDER ARTHROSCOPY Right    TOTAL ABDOMINAL HYSTERECTOMY W/ BILATERAL SALPINGOOPHORECTOMY  04/18/2005   WISDOM TOOTH EXTRACTION     as teenager    Prior to Admission medications   Medication Sig Start Date End Date Taking? Authorizing Provider  b complex vitamins tablet Take 1 tablet by mouth 2 (two) times a week.    Yes [provider]  Calcium Carb-Cholecalciferol (CALCIUM 500 + D3 PO) Take by mouth.   Yes [provider]  cholecalciferol (VITAMIN D) 1000 units tablet Take 1,000 Units by mouth daily.   Yes [provider]  magnesium oxide (MAG-OX) 400 MG tablet Take 400 mg by mouth daily.   Yes [provider]  omega-3 acid ethyl esters (LOVAZA) 1 G capsule  Take 1 g by mouth 2 (two) times a week.    Yes [provider]  rosuvastatin (CRESTOR) 5 MG tablet Take 5 mg by mouth 3 (three) times a week. 11/06/20  Yes [provider]  Ascorbic Acid (VITAMIN C) 100 MG tablet Take 100 mg by mouth 2 (two) times a week.     [provider]    Current Outpatient Medications  Medication Sig Dispense Refill   b complex vitamins tablet Take 1 tablet by mouth 2 (two) times a week.      Calcium Carb-Cholecalciferol (CALCIUM 500 + D3 PO) Take by mouth.     cholecalciferol (VITAMIN D) 1000 units tablet Take 1,000 Units by mouth daily.     magnesium oxide (MAG-OX) 400 MG tablet Take 400 mg by mouth daily.     omega-3 acid ethyl esters (LOVAZA) 1 G capsule Take 1 g by mouth 2 (two) times a week.      rosuvastatin (CRESTOR) 5 MG tablet Take 5 mg by mouth 3 (three) times a week.     Ascorbic Acid (VITAMIN C) 100 MG tablet Take 100 mg by mouth 2 (two) times a week.      Current Facility-Administered Medications  Medication Dose Route Frequency Provider Last Rate Last Admin   0.9 %  sodium chloride infusion  500 mL Intravenous Once Vala Raffo, Lajuan Lines, MD        Allergies as of 04/05/2021 - Review Complete 04/05/2021  Allergen Reaction Noted   Latex Other (See Comments) 03/12/2013   Penicillins      Family History  Problem Relation Age of Onset   Hypertension Mother    Breast cancer Mother    Heart disease Mother    Heart disease Father    Heart attack Father 98       deceased   Prostate cancer Maternal Uncle    Colon cancer Neg Hx    Colon polyps Neg Hx    Esophageal cancer Neg Hx    Rectal cancer Neg Hx    Stomach cancer Neg Hx     Social History   Socioeconomic History   Marital status: Married    Spouse name: Not on file   Number of children: 2   Years of education: Not on file   Highest education level: Not on file  Occupational History   Not on file  Tobacco Use   Smoking status: Never   Smokeless tobacco: Never   Substance and Sexual Activity   Alcohol use: Yes    Alcohol/week: 0.0 standard drinks    Comment: 3 per week   Drug use: No   Sexual activity: Yes  Other Topics Concern   Not on file  Social History Narrative   Not on file   Social Determinants of Health   Financial Resource Strain: Not on file  Food Insecurity: Not on file  Transportation Needs: Not on file  Physical Activity: Not on file  Stress: Not on file  Social Connections: Not on file  Intimate Partner Violence: Not on file    Physical Exam: Vital signs in last 24 hours: @BP  112/60    Pulse 68    Temp (!) 96.9 F (36.1 C) (Temporal)    Resp 14    Ht 5\' 5"  (1.651 m)    Wt 123 lb (55.8 kg)    SpO2 100%    BMI 20.47 kg/m  GEN: NAD EYE: Sclerae anicteric ENT: MMM CV: Non-tachycardic Pulm: CTA b/l GI: Soft, NT/ND NEURO:  Alert & Oriented x 3   Zenovia Jarred, MD [facility]  04/05/2021 9:58 AM

## 2021-04-05 NOTE — Progress Notes (Signed)
Called to room to assist during endoscopic procedure.  Patient ID and intended procedure confirmed with present staff. Received instructions for my participation in the procedure from the performing physician.  

## 2021-04-05 NOTE — Op Note (Signed)
[facility] Patient Name: Jennifer [name] Procedure Date: 04/05/2021 9:33 AM MRN: [id number] Endoscopist: Jerene Bears , MD Age: 75 Referring MD:  Date of Birth: 10/04/1945 Gender: Female Account #: [id number] Procedure:                Colonoscopy Indications:              High risk colon cancer surveillance: Personal                            history of non-advanced adenoma, Last colonoscopy:                            July 2017 Medicines:                Monitored Anesthesia Care Procedure:                Pre-Anesthesia Assessment:                           - Prior to the procedure, a History and Physical                            was performed, and patient medications and                            allergies were reviewed. The patient's tolerance of                            previous anesthesia was also reviewed. The risks                            and benefits of the procedure and the sedation                            options and risks were discussed with the patient.                            All questions were answered, and informed consent                            was obtained. Prior Anticoagulants: The patient has                            taken no previous anticoagulant or antiplatelet                            agents. ASA Grade Assessment: II - A patient with                            mild systemic disease. After reviewing the risks                            and benefits, the patient was deemed in  satisfactory condition to undergo the procedure.                           After obtaining informed consent, the colonoscope                            was passed under direct vision. Throughout the                            procedure, the patient's blood pressure, pulse, and                            oxygen saturations were monitored continuously. The                            Olympus CF-HQ190L [id number] was introduced through                             the anus and advanced to the cecum, identified by                            appendiceal orifice and ileocecal valve. The                            colonoscopy was performed without difficulty. The                            patient tolerated the procedure well. The quality                            of the bowel preparation was good after irrigation                            and lavage. The ileocecal valve, appendiceal                            orifice, and rectum were photographed. Scope In: 9:39:11 AM Scope Out: 9:57:56 AM Scope Withdrawal Time: 0 hours 9 minutes 40 seconds  Total Procedure Duration: 0 hours 18 minutes 45 seconds  Findings:                 The digital rectal exam was normal.                           A 5 mm polyp was found in the ileocecal valve. The                            polyp was sessile. The polyp was removed with a                            cold snare. Resection and retrieval were complete.                           An area of inflamed prolapsed mucosa was  again seen                            in the distal rectum. Scarring from previous                            hemorrhoidal banding is seen.                           The exam was otherwise without abnormality. Complications:            No immediate complications. Estimated Blood Loss:     Estimated blood loss: none. Impression:               - One 5 mm polyp at the ileocecal valve, removed                            with a cold snare. Resected and retrieved.                           - Rectal prolapsed mucosa with associated                            inflammation.                           - The examination was otherwise normal. Recommendation:           - Patient has a contact number available for                            emergencies. The signs and symptoms of potential                            delayed complications were discussed with the                            patient.  Return to normal activities tomorrow.                            Written discharge instructions were provided to the                            patient.                           - Resume previous diet.                           - Continue present medications.                           - Await pathology results.                           - If rectal prolapse is a problem then surgical  referral can be provided.                           - No recommendation at this time regarding repeat                            colonoscopy due to age at next surveillance                            interval. Jerene Bears, MD 04/05/2021 10:14:02 AM This report has been signed electronically.

## 2021-04-05 NOTE — Progress Notes (Signed)
Pt's states no medical or surgical changes since previsit or office visit. 

## 2021-04-06 ENCOUNTER — Encounter: Payer: Self-pay | Admitting: Internal Medicine

## 2021-04-06 DIAGNOSIS — K623 Rectal prolapse: Secondary | ICD-10-CM | POA: Insufficient documentation

## 2021-04-07 ENCOUNTER — Telehealth: Payer: Self-pay

## 2021-04-07 NOTE — Telephone Encounter (Signed)
°  Follow up Call-  Call back number 04/05/2021  Post procedure Call Back phone  # [phone]  Permission to leave phone message Yes  Some recent data might be hidden     Patient questions:  Do you have a fever, pain , or abdominal swelling? No. Pain Score  0 *  Have you tolerated food without any problems? Yes.    Have you been able to return to your normal activities? Yes.    Do you have any questions about your discharge instructions: Diet   No. Medications  No. Follow up visit  No.  Do you have questions or concerns about your Care? No.  Actions: * If pain score is 4 or above: No action needed, pain <4.  Have you developed a fever since your procedure? no  2.   Have you had an respiratory symptoms (SOB or cough) since your procedure? no  3.   Have you tested positive for COVID 19 since your procedure no  4.   Have you had any family members/close contacts diagnosed with the COVID 19 since your procedure?  no   If yes to any of these questions please route to Joylene John, RN and Joella Prince, RN

## 2021-04-08 ENCOUNTER — Encounter: Payer: Self-pay | Admitting: Internal Medicine

## 2021-04-17 ENCOUNTER — Encounter: Payer: Self-pay | Admitting: Internal Medicine

## 2021-04-23 DIAGNOSIS — M9903 Segmental and somatic dysfunction of lumbar region: Secondary | ICD-10-CM | POA: Diagnosis not present

## 2021-04-23 DIAGNOSIS — M9902 Segmental and somatic dysfunction of thoracic region: Secondary | ICD-10-CM | POA: Diagnosis not present

## 2021-04-23 DIAGNOSIS — M4604 Spinal enthesopathy, thoracic region: Secondary | ICD-10-CM | POA: Diagnosis not present

## 2021-04-23 DIAGNOSIS — M9901 Segmental and somatic dysfunction of cervical region: Secondary | ICD-10-CM | POA: Diagnosis not present

## 2021-05-07 DIAGNOSIS — M9902 Segmental and somatic dysfunction of thoracic region: Secondary | ICD-10-CM | POA: Diagnosis not present

## 2021-05-07 DIAGNOSIS — M9903 Segmental and somatic dysfunction of lumbar region: Secondary | ICD-10-CM | POA: Diagnosis not present

## 2021-05-07 DIAGNOSIS — M9901 Segmental and somatic dysfunction of cervical region: Secondary | ICD-10-CM | POA: Diagnosis not present

## 2021-05-07 DIAGNOSIS — M4604 Spinal enthesopathy, thoracic region: Secondary | ICD-10-CM | POA: Diagnosis not present

## 2021-05-10 ENCOUNTER — Other Ambulatory Visit: Payer: Self-pay | Admitting: Internal Medicine

## 2021-05-10 DIAGNOSIS — Z1231 Encounter for screening mammogram for malignant neoplasm of breast: Secondary | ICD-10-CM

## 2021-05-12 DIAGNOSIS — L812 Freckles: Secondary | ICD-10-CM | POA: Diagnosis not present

## 2021-05-12 DIAGNOSIS — D1801 Hemangioma of skin and subcutaneous tissue: Secondary | ICD-10-CM | POA: Diagnosis not present

## 2021-05-12 DIAGNOSIS — L821 Other seborrheic keratosis: Secondary | ICD-10-CM | POA: Diagnosis not present

## 2021-05-12 DIAGNOSIS — D2272 Melanocytic nevi of left lower limb, including hip: Secondary | ICD-10-CM | POA: Diagnosis not present

## 2021-05-12 DIAGNOSIS — L438 Other lichen planus: Secondary | ICD-10-CM | POA: Diagnosis not present

## 2021-05-19 NOTE — Progress Notes (Signed)
[place] Phone: [phone] Subjective:   Jennifer Jennifer [name], am serving as a scribe for Dr. Hulan Saas.This visit occurred during the SARS-CoV-2 public health emergency.  Safety protocols were in place, including screening questions prior to the visit, additional usage of staff PPE, and extensive cleaning of exam room while observing appropriate contact time as indicated for disinfecting solutions.  I'm seeing this patient by the request  of:  [name], MD  CC: Neck and back pain follow-up  [id number]  Jennifer Jennifer [name] is a 76 y.o. female coming in with complaint of back and neck pain. OMT on 04/01/2021. Patient states that she has some hip pain but back pain has been better. She get a nerve sensation in the 4th toes when talking barefoot.   Medications patient has been prescribed: None  Taking:         Reviewed prior external information including notes and imaging from previsou exam, outside providers and external EMR if available.   As well as notes that were available from care everywhere and other healthcare systems.  Past medical history, social, surgical and family history all reviewed in electronic medical record.  Jennifer [name] pertanent information unless stated regarding to the chief complaint.   Past Medical History:  Diagnosis Date   Allergy    seasonal   Anemia    Arthritis    Cataract    forming   DJD of shoulder    Hyperlipidemia    Insomnia    Internal hemorrhoids    Tubular adenoma of colon    Vitamin D deficiency     Allergies  Allergen Reactions   Latex Other (See Comments)    Patient preference  Sensitivity    Penicillins     REACTION: Childhood reaction     Review of Systems:  Jennifer [name] headache, visual changes, nausea, vomiting, diarrhea, constipation, dizziness, abdominal pain, skin rash, fevers, chills, night sweats, weight loss, swollen lymph nodes, bodyaches, joint  swelling, chest pain, shortness of breath, mood changes. POSITIVE muscle aches  Objective  Blood pressure 114/70, pulse 64, height 5\' 5"  (1.651 m), weight 128 lb (58.1 kg), SpO2 99 %.   General: Jennifer [name] apparent distress alert and oriented x3 mood and affect normal, dressed appropriately.  HEENT: Pupils equal, extraocular movements intact  Respiratory: Patient's speak in full sentences and does not appear short of breath  Cardiovascular: Jennifer [name] lower extremity edema, non tender, Jennifer [name] erythema  Neck exam does have some mild loss of lordosis.  Patient does have significant degenerative scoliosis of the lumbar spine.  Patient does have tenderness to palpation. Foot exam shows breakdown of the transverse arch noted.  Positive squeeze test on the left side.  Patient does have inversion of the toes on the stretch fourth and fifth of the left side.  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  Patient did have orthotics made greater than 4 years ago and I do think that new ones could be beneficial.  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  Discussed icing regimen and home exercises, which activities to do which wants to avoid.  Follow-up with me again in 4 to 6 weeks  Degenerative disc  disease, lumbar Chronic problem overall.  Does have mild exacerbation.  Discussed the over-the-counter medications again.  Continue to stay active.  Responds well to manipulation.  Follow-up again in 6 to 8 weeks   Nonallopathic problems  Decision today to treat with OMT was based on Physical Exam  After verbal consent patient was treated with HVLA, ME, FPR techniques in  thoracic, lumbar, and sacral  areas  Patient tolerated the procedure well with improvement in  symptoms  Patient given exercises, stretches and lifestyle modifications  See medications in patient instructions if given  Patient will follow up in 4-8 weeks      The above documentation has been reviewed and is accurate and complete [name], DO       Note: This dictation was prepared with Dragon dictation along with smaller phrase technology. Any transcriptional errors that result from this process are unintentional.

## 2021-05-20 ENCOUNTER — Ambulatory Visit (INDEPENDENT_AMBULATORY_CARE_PROVIDER_SITE_OTHER): Payer: Medicare Other | Admitting: Family Medicine

## 2021-05-20 ENCOUNTER — Other Ambulatory Visit: Payer: Self-pay

## 2021-05-20 ENCOUNTER — Encounter: Payer: Self-pay | Admitting: Family Medicine

## 2021-05-20 VITALS — BP 114/70 | HR 64 | Ht 65.0 in | Wt 128.0 lb

## 2021-05-20 DIAGNOSIS — M79671 Pain in right foot: Secondary | ICD-10-CM

## 2021-05-20 DIAGNOSIS — M79672 Pain in left foot: Secondary | ICD-10-CM

## 2021-05-20 DIAGNOSIS — M9903 Segmental and somatic dysfunction of lumbar region: Secondary | ICD-10-CM

## 2021-05-20 DIAGNOSIS — M9904 Segmental and somatic dysfunction of sacral region: Secondary | ICD-10-CM

## 2021-05-20 DIAGNOSIS — M5136 Other intervertebral disc degeneration, lumbar region: Secondary | ICD-10-CM | POA: Diagnosis not present

## 2021-05-20 DIAGNOSIS — R269 Unspecified abnormalities of gait and mobility: Secondary | ICD-10-CM

## 2021-05-20 DIAGNOSIS — M9902 Segmental and somatic dysfunction of thoracic region: Secondary | ICD-10-CM

## 2021-05-20 NOTE — Assessment & Plan Note (Signed)
Chronic problem overall.  Does have mild exacerbation.  Discussed the over-the-counter medications again.  Continue to stay active.  Responds well to manipulation.  Follow-up again in 6 to 8 weeks

## 2021-05-20 NOTE — Assessment & Plan Note (Signed)
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  Patient did have orthotics made greater than 4 years ago and I do think that new ones could be beneficial.  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  Discussed icing regimen and home exercises, which activities to do which wants to avoid.  Follow-up with me again in 4 to 6 weeks

## 2021-05-20 NOTE — Patient Instructions (Signed)
Orthotics They will call you Keep doing what you are doing Watch swim stroke See me in 2-3 months

## 2021-06-04 DIAGNOSIS — M9902 Segmental and somatic dysfunction of thoracic region: Secondary | ICD-10-CM | POA: Diagnosis not present

## 2021-06-04 DIAGNOSIS — M9901 Segmental and somatic dysfunction of cervical region: Secondary | ICD-10-CM | POA: Diagnosis not present

## 2021-06-04 DIAGNOSIS — M4604 Spinal enthesopathy, thoracic region: Secondary | ICD-10-CM | POA: Diagnosis not present

## 2021-06-04 DIAGNOSIS — M9903 Segmental and somatic dysfunction of lumbar region: Secondary | ICD-10-CM | POA: Diagnosis not present

## 2021-06-08 ENCOUNTER — Other Ambulatory Visit: Payer: Self-pay

## 2021-06-08 ENCOUNTER — Ambulatory Visit (INDEPENDENT_AMBULATORY_CARE_PROVIDER_SITE_OTHER): Payer: Medicare Other | Admitting: Sports Medicine

## 2021-06-08 VITALS — BP 117/65 | Ht 65.0 in | Wt 123.0 lb

## 2021-06-08 DIAGNOSIS — M216X9 Other acquired deformities of unspecified foot: Secondary | ICD-10-CM

## 2021-06-08 DIAGNOSIS — M2021 Hallux rigidus, right foot: Secondary | ICD-10-CM

## 2021-06-08 DIAGNOSIS — R269 Unspecified abnormalities of gait and mobility: Secondary | ICD-10-CM

## 2021-06-08 DIAGNOSIS — M2022 Hallux rigidus, left foot: Secondary | ICD-10-CM

## 2021-06-08 NOTE — Progress Notes (Signed)
Subjective: Jennifer [name] is a pleasant 76 year-old female who presents for evaluation of custom orthotics.  She was seen by Dr. Gardenia Phlegm on 05/20/2021 and noted to have breakdown of transverse arch with possible development of neuroma.  She had orthotics made greater than 4-5 years ago and would like new ones today.  Patient was fitted for a: standard, cushioned, semi-rigid orthotic. The orthotic was heated and afterward the patient stood on the orthotic blank positioned on the orthotic stand. The patient was positioned in subtalar neutral position and 10 degrees of ankle dorsiflexion in a weight bearing stance. After completion of molding, a stable base was applied to the orthotic blank. The blank was ground to a stable position for weight bearing. Size: 7 Base: blue EVA Posting: 1st ray post bilaterally Additional orthotic padding: Small metatarsal pads bilaterally  Total time spent with the patient was 30 minutes with greater than 50% of the time spent in face-to-face consultation discussing orthotic construction, instruction, and sitting. Gait was neutral with orthotics in place. Patient found them to be comfortable. Follow-up as needed.  Elba Barman, DO PGY-4, Sports Medicine Fellow [facility]  I observed and examined the patient with the [facility] resident and agree with assessment and plan.  Note reviewed and modified by me. Ila Mcgill, MD

## 2021-06-08 NOTE — Assessment & Plan Note (Signed)
See the orthotic preparation note She needs the added corrections as placed

## 2021-06-23 ENCOUNTER — Ambulatory Visit
Admission: RE | Admit: 2021-06-23 | Discharge: 2021-06-23 | Disposition: A | Discharge status: Home / Self Care | Payer: Medicare Other | Source: Ambulatory Visit | Attending: Internal Medicine | Admitting: Internal Medicine

## 2021-06-23 DIAGNOSIS — Z1231 Encounter for screening mammogram for malignant neoplasm of breast: Secondary | ICD-10-CM | POA: Diagnosis not present

## 2021-06-24 DIAGNOSIS — M9902 Segmental and somatic dysfunction of thoracic region: Secondary | ICD-10-CM | POA: Diagnosis not present

## 2021-06-24 DIAGNOSIS — M4604 Spinal enthesopathy, thoracic region: Secondary | ICD-10-CM | POA: Diagnosis not present

## 2021-06-24 DIAGNOSIS — M9903 Segmental and somatic dysfunction of lumbar region: Secondary | ICD-10-CM | POA: Diagnosis not present

## 2021-06-24 DIAGNOSIS — M9901 Segmental and somatic dysfunction of cervical region: Secondary | ICD-10-CM | POA: Diagnosis not present

## 2021-06-25 ENCOUNTER — Encounter: Payer: Self-pay | Admitting: Internal Medicine

## 2021-07-21 NOTE — Progress Notes (Signed)
?[name] D.O. ?[facility] ?[place] ?Phone: [phone] ?Subjective:   ?I, [name], am serving as a scribe for Dr. Hulan Saas. ? ?This visit occurred during the SARS-CoV-2 public health emergency.  Safety protocols were in place, including screening questions prior to the visit, additional usage of staff PPE, and extensive cleaning of exam room while observing appropriate contact time as indicated for disinfecting solutions.  ?I'm seeing this patient by the request  of:  [name], MD ? ?CC: Back and neck pain follow-up ? ?[id number]  ?Jennifer [name] is a 76 y.o. female coming in with complaint of back and neck pain. OMT 05/20/2021. Patient states that Dr. Oneida Alar made her some custom orthotics. Feels tight and knows her spine is ready for manipulation.  ? ?Medications patient has been prescribed: None ? ?Taking: ? ? ?  ? ? ? ? ?Reviewed prior external information including notes and imaging from previsou exam, outside providers and external EMR if available.  ? ?As well as notes that were available from care everywhere and other healthcare systems. ? ?Past medical history, social, surgical and family history all reviewed in electronic medical record.  No pertanent information unless stated regarding to the chief complaint.  ? ?Past Medical History:  ?Diagnosis Date  ? Allergy   ? seasonal  ? Anemia   ? Arthritis   ? Cataract   ? forming  ? DJD of shoulder   ? Hyperlipidemia   ? Insomnia   ? Internal hemorrhoids   ? Tubular adenoma of colon   ? Vitamin D deficiency   ?  ?Allergies  ?Allergen Reactions  ? Latex Other (See Comments)  ?  Patient preference  ?Sensitivity   ? Penicillins   ?  REACTION: Childhood reaction  ? ? ? ?Review of Systems: ? No headache, visual changes, nausea, vomiting, diarrhea, constipation, dizziness, abdominal pain, skin rash, fevers, chills, night sweats, weight loss, swollen lymph nodes, body aches, joint swelling, chest  pain, shortness of breath, mood changes. POSITIVE muscle aches ? ?Objective  ?Blood pressure 96/64, pulse 68, height '5\' 5"'$  (1.651 m), weight 123 lb (55.8 kg), SpO2 98 %. ?  ?General: No apparent distress alert and oriented x3 mood and affect normal, dressed appropriately.  ?HEENT: Pupils equal, extraocular movements intact  ?Respiratory: Patient's speak in full sentences and does not appear short of breath  ?Cardiovascular: No lower extremity edema, non tender, no erythema  ?Arthritic changes of multiple joints.  Patient does have significant breakdown of the longitudinal arch and the transverse arch bilaterally of the feet.  Patient back exam does have some scoliosis noted.  Still has the arthritic changes of the shoulder noted that is no change though since previous exam.  Crepitus noted. ?Osteopathic findings ? ?C2 flexed rotated and side bent right ?C5 flexed rotated and side bent left ?T3 extended rotated and side bent right inhaled rib ?T9 extended rotated and side bent left ?L2 flexed rotated and side bent right ?Sacrum right on right ? ? ? ? ?  ?Assessment and Plan: ? ?Degenerative disc disease, lumbar ?Degenerative disc disease.  Discussed with patient this is more of a chronic problem with mild exacerbation.  Continues to stay active otherwise.  Discussed which activities to do which ones to avoid.  Increase activity slowly.  Follow-up again in 6 to 8 weeks  ? ?Nonallopathic problems ? ?Decision today to treat with OMT was based on Physical Exam ? ?After verbal consent patient  was treated with HVLA, ME, FPR techniques in cervical, rib, thoracic, lumbar, and sacral  areas ? ?Patient tolerated the procedure well with improvement in symptoms ? ?Patient given exercises, stretches and lifestyle modifications ? ?See medications in patient instructions if given ? ?Patient will follow up in 4-8 weeks ? ?  ? ? ?The above documentation has been reviewed and is accurate and complete [name], DO ? ? ?  ? ?  Note: This dictation was prepared with Dragon dictation along with smaller phrase technology. Any transcriptional errors that result from this process are unintentional.    ?  ?  ? ?

## 2021-07-22 ENCOUNTER — Encounter: Payer: Self-pay | Admitting: Family Medicine

## 2021-07-22 ENCOUNTER — Ambulatory Visit (INDEPENDENT_AMBULATORY_CARE_PROVIDER_SITE_OTHER): Payer: Medicare Other | Admitting: Family Medicine

## 2021-07-22 VITALS — BP 96/64 | HR 68 | Ht 65.0 in | Wt 123.0 lb

## 2021-07-22 DIAGNOSIS — M9903 Segmental and somatic dysfunction of lumbar region: Secondary | ICD-10-CM | POA: Diagnosis not present

## 2021-07-22 DIAGNOSIS — M5136 Other intervertebral disc degeneration, lumbar region: Secondary | ICD-10-CM | POA: Diagnosis not present

## 2021-07-22 DIAGNOSIS — M9901 Segmental and somatic dysfunction of cervical region: Secondary | ICD-10-CM

## 2021-07-22 DIAGNOSIS — M9908 Segmental and somatic dysfunction of rib cage: Secondary | ICD-10-CM | POA: Diagnosis not present

## 2021-07-22 DIAGNOSIS — M9904 Segmental and somatic dysfunction of sacral region: Secondary | ICD-10-CM

## 2021-07-22 DIAGNOSIS — M9902 Segmental and somatic dysfunction of thoracic region: Secondary | ICD-10-CM | POA: Diagnosis not present

## 2021-07-22 NOTE — Patient Instructions (Signed)
Great to see you ?Overall doing well ?Do not do toe spaces ?See Dr. Glennon Mac in 3-4 weeks ?See me in 6-8 weeks ?

## 2021-07-22 NOTE — Assessment & Plan Note (Signed)
Degenerative disc disease.  Discussed with patient this is more of a chronic problem with mild exacerbation.  Continues to stay active otherwise.  Discussed which activities to do which ones to avoid.  Increase activity slowly.  Follow-up again in 6 to 8 weeks ?

## 2021-08-02 DIAGNOSIS — M9902 Segmental and somatic dysfunction of thoracic region: Secondary | ICD-10-CM | POA: Diagnosis not present

## 2021-08-02 DIAGNOSIS — M4604 Spinal enthesopathy, thoracic region: Secondary | ICD-10-CM | POA: Diagnosis not present

## 2021-08-02 DIAGNOSIS — M9903 Segmental and somatic dysfunction of lumbar region: Secondary | ICD-10-CM | POA: Diagnosis not present

## 2021-08-02 DIAGNOSIS — M9901 Segmental and somatic dysfunction of cervical region: Secondary | ICD-10-CM | POA: Diagnosis not present

## 2021-08-10 NOTE — Progress Notes (Signed)
? Benito Mccreedy D.Merril Abbe ?[facility] ?[place] ?Phone: [phone] ?  ?Assessment and Plan:   ?  ?1. Degenerative disc disease, lumbar ?2. Somatic dysfunction of thoracic region ?3. Somatic dysfunction of lumbar region ?4. Somatic dysfunction of pelvic region ?5. Somatic dysfunction of rib region ?6. Somatic dysfunction of sacral region ?- chronic with exacerbation, subsequent visit ?- multiple MSK complaints with most prominent being right lower back after travel and flights recently without red flag symptom ?- Start meloxicam 7.5 mg daily x1 week.  May use remaining meloxicam as needed once daily for pain control.  Do not to use additional NSAIDs while taking meloxicam.  May use Tylenol [id number] mg 2 to 3 times a day for breakthrough pain. ?- Patient has received significant relief with OMT in the past.  Elects for repeat OMT today.  Tolerated well per note below. ?- Decision today to treat with OMT was based on Physical Exam ?  ?After verbal consent patient was treated with HVLA (high velocity low amplitude), ME (muscle energy), FPR (flex positional release), ST (soft tissue), PC/PD (Pelvic Compression/ Pelvic Decompression) techniques in sacrum, rib, thoracic, lumbar, and pelvic areas. Patient tolerated the procedure well with improvement in symptoms.  Patient educated on potential side effects of soreness and recommended to rest, hydrate, and use Tylenol as needed for pain control. ?  ?Pertinent previous records reviewed include lumbar xray 04/23/2020, pelvis xray 04/23/2020 ?  ?Follow Up: 2 weeks for repeat OMT and review if meloxicam was effective   ? ?  ?Subjective:   ?I, Pincus Badder, am serving as a Education administrator for [employer] ? ?Chief Complaint: back and neck pain  ? ?HPI:  ?07/22/2021 ?Taquila Leys is a 76 y.o. female coming in with complaint of back and neck pain. OMT 05/20/2021. Patient states that Dr. Oneida Alar made her some custom orthotics. Feels  tight and knows her spine is ready for manipulation.  ? ?08/11/2021 ?Patient states that she is okay she has lower back pain out the wazoo, had a deep massage, has a facet issue and a vertebrae that is more compressed she is worried that this is the beginning of something chronic ,would like a tune up as well  ? ?Relevant Historical Information: osteoporosis ? ?Additional pertinent review of systems negative. ? ?Current Outpatient Medications  ?Medication Sig Dispense Refill  ? Ascorbic Acid (VITAMIN C) 100 MG tablet Take 100 mg by mouth 2 (two) times a week.     ? b complex vitamins tablet Take 1 tablet by mouth 2 (two) times a week.     ? Calcium Carb-Cholecalciferol (CALCIUM 500 + D3 PO) Take by mouth.    ? cholecalciferol (VITAMIN D) 1000 units tablet Take 1,000 Units by mouth daily.    ? magnesium oxide (MAG-OX) 400 MG tablet Take 400 mg by mouth daily.    ? meloxicam (MOBIC) 7.5 MG tablet Take 1 tablet (7.5 mg total) by mouth daily. 30 tablet 0  ? omega-3 acid ethyl esters (LOVAZA) 1 G capsule Take 1 g by mouth 2 (two) times a week.     ? rosuvastatin (CRESTOR) 5 MG tablet Take 5 mg by mouth 3 (three) times a week.    ? ?No current facility-administered medications for this visit.  ?  ?  ?Objective:   ?  ?Vitals:  ? 08/11/21 1257  ?BP: 124/80  ?Pulse: 67  ?SpO2: 99%  ?Weight: 123 lb (55.8 kg)  ?Height: '5\' 5"'$  (1.651  m)  ?  ?  ?Body mass index is 20.47 kg/m?.  ?  ?Physical Exam:   ?  ?General: Well-appearing, cooperative, sitting comfortably in no acute distress.  ? ?OMT Physical Exam: ? ?ASIS Compression Test: Positive Right ?Sacrum: TTP bilateral sacral base. + sphinx  ?Rib: ribs 5-7 stuck in inhalation ?Thoracic: TTP paraspinal,T5-7RRSL ?Lumbar: TTP paraspinal,L2RRSR ?Pelvis: Right anterior innominate with outflare ? ?Electronically signed by:  ?Benito Mccreedy D.Merril Abbe ?[facility] ?1:31 PM 08/11/21 ?

## 2021-08-11 ENCOUNTER — Ambulatory Visit (INDEPENDENT_AMBULATORY_CARE_PROVIDER_SITE_OTHER): Payer: Medicare Other | Admitting: Sports Medicine

## 2021-08-11 VITALS — BP 124/80 | HR 67 | Ht 65.0 in | Wt 123.0 lb

## 2021-08-11 DIAGNOSIS — M9905 Segmental and somatic dysfunction of pelvic region: Secondary | ICD-10-CM | POA: Diagnosis not present

## 2021-08-11 DIAGNOSIS — M9908 Segmental and somatic dysfunction of rib cage: Secondary | ICD-10-CM | POA: Diagnosis not present

## 2021-08-11 DIAGNOSIS — M9903 Segmental and somatic dysfunction of lumbar region: Secondary | ICD-10-CM | POA: Diagnosis not present

## 2021-08-11 DIAGNOSIS — M9904 Segmental and somatic dysfunction of sacral region: Secondary | ICD-10-CM

## 2021-08-11 DIAGNOSIS — M5136 Other intervertebral disc degeneration, lumbar region: Secondary | ICD-10-CM | POA: Diagnosis not present

## 2021-08-11 DIAGNOSIS — M9902 Segmental and somatic dysfunction of thoracic region: Secondary | ICD-10-CM

## 2021-08-11 MED ORDER — MELOXICAM 7.5 MG PO TABS: 7.5000 mg | ORAL_TABLET | Freq: Every day | ORAL | 0 refills | Status: DC

## 2021-08-11 NOTE — Patient Instructions (Addendum)
Good to see you  ?Start meloxicam 7.5 mg daily x 1 week.  If still having pain after 1 weeks, complete 2 nd -week of meloxicam. May use remaining meloxicam as needed once daily for pain control.  Do not to use additional NSAIDs while taking meloxicam.  May use Tylenol [id number] mg 2 to 3 times a day for breakthrough pain. ?Low back HEP ?2-3 week follow up  ? ?

## 2021-08-16 DIAGNOSIS — Z23 Encounter for immunization: Secondary | ICD-10-CM | POA: Diagnosis not present

## 2021-08-18 ENCOUNTER — Telehealth: Payer: Self-pay | Admitting: Internal Medicine

## 2021-08-18 NOTE — Telephone Encounter (Signed)
LVM for pt to rtn my call to schedule AWV with [facility]. Please schedule this appt if pt calls the office.  °

## 2021-08-27 NOTE — Progress Notes (Signed)
?[name] D.O. ?[facility] ?[place] ?Phone: [phone] ?Subjective:   ?I, Jennifer [name], am serving as a scribe for Dr. Hulan Saas. ? ?This visit occurred during the SARS-CoV-2 public health emergency.  Safety protocols were in place, including screening questions prior to the visit, additional usage of staff PPE, and extensive cleaning of exam room while observing appropriate contact time as indicated for disinfecting solutions.  ? ? ?I'm seeing this patient by the request  of:  [name], MD ? ?CC: Back and neck pain follow-up ? ?[id number]  ?Jennifer [name] is a 76 y.o. female coming in with complaint of back and neck pain. OMT on 08/11/2021 with Dr. Glennon Mac. Patient states that is having some soreness in lower back but no new injury. Increase in pain in L hip. Has been doing exercises from Dr. Glennon Mac and this has been helping.  ? ?Medications patient has been prescribed: None ? ?Taking: ? ? ?  ? ? ? ? ?Reviewed prior external information including notes and imaging from previsou exam, outside providers and external EMR if available.  ? ?As well as notes that were available from care everywhere and other healthcare systems. ? ?Past medical history, social, surgical and family history all reviewed in electronic medical record.  No pertanent information unless stated regarding to the chief complaint.  ? ?Past Medical History:  ?Diagnosis Date  ? Allergy   ? seasonal  ? Anemia   ? Arthritis   ? Cataract   ? forming  ? DJD of shoulder   ? Hyperlipidemia   ? Insomnia   ? Internal hemorrhoids   ? Tubular adenoma of colon   ? Vitamin D deficiency   ?  ?Allergies  ?Allergen Reactions  ? Latex Other (See Comments)  ?  Patient preference  ?Sensitivity   ? Penicillins   ?  REACTION: Childhood reaction  ? ? ? ?Review of Systems: ? No headache, visual changes, nausea, vomiting, diarrhea, constipation, dizziness, abdominal pain, skin rash, fevers, chills, night  sweats, weight loss, swollen lymph nodes, body aches, joint swelling, chest pain, shortness of breath, mood changes. POSITIVE muscle aches ? ?Objective  ?Blood pressure 124/88, pulse 63, height '5\' 5"'$  (1.651 m), weight 127 lb (57.6 kg), SpO2 99 %. ?  ?General: No apparent distress alert and oriented x3 mood and affect normal, dressed appropriately.  ?HEENT: Pupils equal, extraocular movements intact  ?Respiratory: Patient's speak in full sentences and does not appear short of breath  ?Cardiovascular: No lower extremity edema, non tender, no erythema  ?Arthritic changes of multiple joints noted.  Severe arthritic changes of the right shoulder noted. ?Back - low back exam noted with loss of lordosis and scoliosis  ?Lacks 10 degrees of extension ? ?Osteopathic findings ? ?C2 flexed rotated and side bent right ?C5 flexed rotated and side bent left ?T3 extended rotated and side bent right inhaled rib ?T9 extended rotated and side bent left ?L2-L5 neutral rotated right side bent left ?Sacrum left on left ? ? ? ?  ?Assessment and Plan: ? ?Degenerative disc disease, lumbar ?Chronic, with exacerbation again.  Discussed with patient icing regimen and home exercises.  Will be traveling to [place] in the future though.  Does respond to manipulation we do start it as well.  We discussed the possibility of other treatment options which would include injection.  Patient will follow-up with me again in 6 weeks otherwise the patient does have the meloxicam for breakthrough. ? ?Degenerative  joint disease of right shoulder-Dr. Tamala Julian ?Chronic but stable.  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  Continue conservative therapy  ? ?Nonallopathic problems ? ?Decision today to treat with OMT was based on Physical Exam ? ?After verbal consent patient was treated with HVLA, ME, FPR techniques in cervical, rib, thoracic, lumbar, and sacral  areas ? ?Patient tolerated the procedure well with improvement in  symptoms ? ?Patient given exercises, stretches and lifestyle modifications ? ?See medications in patient instructions if given ? ?Patient will follow up in 4-8 weeks ? ?  ? ? ?The above documentation has been reviewed and is accurate and complete [name], DO ? ? ? ?  ? ? Note: This dictation was prepared with Dragon dictation along with smaller phrase technology. Any transcriptional errors that result from this process are unintentional.    ?  ?  ? ?

## 2021-08-30 ENCOUNTER — Ambulatory Visit: Payer: Medicare Other

## 2021-08-30 DIAGNOSIS — Z0000 Encounter for general adult medical examination without abnormal findings: Secondary | ICD-10-CM

## 2021-08-30 DIAGNOSIS — H43812 Vitreous degeneration, left eye: Secondary | ICD-10-CM | POA: Diagnosis not present

## 2021-08-30 DIAGNOSIS — H25013 Cortical age-related cataract, bilateral: Secondary | ICD-10-CM | POA: Diagnosis not present

## 2021-08-30 DIAGNOSIS — H2513 Age-related nuclear cataract, bilateral: Secondary | ICD-10-CM | POA: Diagnosis not present

## 2021-08-30 DIAGNOSIS — H5203 Hypermetropia, bilateral: Secondary | ICD-10-CM | POA: Diagnosis not present

## 2021-08-30 NOTE — Patient Instructions (Signed)
Ms. Novakovich , ?Thank you for taking time to come for your Medicare Wellness Visit. I appreciate your ongoing commitment to your health goals. Please review the following plan we discussed and let me know if I can assist you in the future.  ? ?Screening recommendations/referrals: ?Colonoscopy: 04/05/2021; discontinued due to age ?Mammogram: 06/23/2021; due every year ?Bone Density: 01/15/2020; due every 2-3 years ?Recommended yearly ophthalmology/optometry visit for glaucoma screening and checkup ?Recommended yearly dental visit for hygiene and checkup ? ?Vaccinations: ?Influenza vaccine: 01/03/2021; due every Fall Season beginning 11/16/2021 ?Pneumococcal vaccine: 06/11/2014, 08/09/2016 ?Tdap vaccine: 09/18/2017; due every 10 years ?Shingles vaccine: 06/13/2017, 09/18/2017   ?Covid-19: 05/24/2019, 06/18/2019, 02/19/2020, 12/24/2020 ? ?Advanced directives: Yes; documents on file ? ?Conditions/risks identified: Yes ? ?Next appointment: Please schedule your next Medicare Wellness Visit with your Nurse Health Advisor in 1 year by calling [phone]. ? ? ?Preventive Care 41 Years and Older, Female ?Preventive care refers to lifestyle choices and visits with your health care provider that can promote health and wellness. ?What does preventive care include? ?A yearly physical exam. This is also called an annual well check. ?Dental exams once or twice a year. ?Routine eye exams. Ask your health care provider how often you should have your eyes checked. ?Personal lifestyle choices, including: ?Daily care of your teeth and gums. ?Regular physical activity. ?Eating a healthy diet. ?Avoiding tobacco and drug use. ?Limiting alcohol use. ?Practicing safe sex. ?Taking low-dose aspirin every day. ?Taking vitamin and mineral supplements as recommended by your health care provider. ?What happens during an annual well check? ?The services and screenings done by your health care provider during your annual well check will depend on your age, overall  health, lifestyle risk factors, and family history of disease. ?Counseling  ?Your health care provider may ask you questions about your: ?Alcohol use. ?Tobacco use. ?Drug use. ?Emotional well-being. ?Home and relationship well-being. ?Sexual activity. ?Eating habits. ?History of falls. ?Memory and ability to understand (cognition). ?Work and work Statistician. ?Reproductive health. ?Screening  ?You may have the following tests or measurements: ?Height, weight, and BMI. ?Blood pressure. ?Lipid and cholesterol levels. These may be checked every 5 years, or more frequently if you are over 40 years old. ?Skin check. ?Lung cancer screening. You may have this screening every year starting at age 15 if you have a 30-pack-year history of smoking and currently smoke or have quit within the past 15 years. ?Fecal occult blood test (FOBT) of the stool. You may have this test every year starting at age 75. ?Flexible sigmoidoscopy or colonoscopy. You may have a sigmoidoscopy every 5 years or a colonoscopy every 10 years starting at age 75. ?Hepatitis C blood test. ?Hepatitis B blood test. ?Sexually transmitted disease (STD) testing. ?Diabetes screening. This is done by checking your blood sugar (glucose) after you have not eaten for a while (fasting). You may have this done every 1-3 years. ?Bone density scan. This is done to screen for osteoporosis. You may have this done starting at age 45. ?Mammogram. This may be done every 1-2 years. Talk to your health care provider about how often you should have regular mammograms. ?Talk with your health care provider about your test results, treatment options, and if necessary, the need for more tests. ?Vaccines  ?Your health care provider may recommend certain vaccines, such as: ?Influenza vaccine. This is recommended every year. ?Tetanus, diphtheria, and acellular pertussis (Tdap, Td) vaccine. You may need a Td booster every 10 years. ?Zoster vaccine. You may need this after  age  26. ?Pneumococcal 13-valent conjugate (PCV13) vaccine. One dose is recommended after age 62. ?Pneumococcal polysaccharide (PPSV23) vaccine. One dose is recommended after age 29. ?Talk to your health care provider about which screenings and vaccines you need and how often you need them. ?This information is not intended to replace advice given to you by your health care provider. Make sure you discuss any questions you have with your health care provider. ?Document Released: 05/01/2015 Document Revised: 12/23/2015 Document Reviewed: 02/03/2015 ?Elsevier Interactive Patient Education ? 2017 [place]. ? ?Fall Prevention in the Home ?Falls can cause injuries. They can happen to people of all ages. There are many things you can do to make your home safe and to help prevent falls. ?What can I do on the outside of my home? ?Regularly fix the edges of walkways and driveways and fix any cracks. ?Remove anything that might make you trip as you walk through a door, such as a raised step or threshold. ?Trim any bushes or trees on the path to your home. ?Use bright outdoor lighting. ?Clear any walking paths of anything that might make someone trip, such as rocks or tools. ?Regularly check to see if handrails are loose or broken. Make sure that both sides of any steps have handrails. ?Any raised decks and porches should have guardrails on the edges. ?Have any leaves, snow, or ice cleared regularly. ?Use sand or salt on walking paths during winter. ?Clean up any spills in your garage right away. This includes oil or grease spills. ?What can I do in the bathroom? ?Use night lights. ?Install grab bars by the toilet and in the tub and shower. Do not use towel bars as grab bars. ?Use non-skid mats or decals in the tub or shower. ?If you need to sit down in the shower, use a plastic, non-slip stool. ?Keep the floor dry. Clean up any water that spills on the floor as soon as it happens. ?Remove soap buildup in the tub or shower  regularly. ?Attach bath mats securely with double-sided non-slip rug tape. ?Do not have throw rugs and other things on the floor that can make you trip. ?What can I do in the bedroom? ?Use night lights. ?Make sure that you have a light by your bed that is easy to reach. ?Do not use any sheets or blankets that are too big for your bed. They should not hang down onto the floor. ?Have a firm chair that has side arms. You can use this for support while you get dressed. ?Do not have throw rugs and other things on the floor that can make you trip. ?What can I do in the kitchen? ?Clean up any spills right away. ?Avoid walking on wet floors. ?Keep items that you use a lot in easy-to-reach places. ?If you need to reach something above you, use a strong step stool that has a grab bar. ?Keep electrical cords out of the way. ?Do not use floor polish or wax that makes floors slippery. If you must use wax, use non-skid floor wax. ?Do not have throw rugs and other things on the floor that can make you trip. ?What can I do with my stairs? ?Do not leave any items on the stairs. ?Make sure that there are handrails on both sides of the stairs and use them. Fix handrails that are broken or loose. Make sure that handrails are as long as the stairways. ?Check any carpeting to make sure that it is firmly attached to the stairs.  Fix any carpet that is loose or worn. ?Avoid having throw rugs at the top or bottom of the stairs. If you do have throw rugs, attach them to the floor with carpet tape. ?Make sure that you have a light switch at the top of the stairs and the bottom of the stairs. If you do not have them, ask someone to add them for you. ?What else can I do to help prevent falls? ?Wear shoes that: ?Do not have high heels. ?Have rubber bottoms. ?Are comfortable and fit you well. ?Are closed at the toe. Do not wear sandals. ?If you use a stepladder: ?Make sure that it is fully opened. Do not climb a closed stepladder. ?Make sure that  both sides of the stepladder are locked into place. ?Ask someone to hold it for you, if possible. ?Clearly mark and make sure that you can see: ?Any grab bars or handrails. ?First and last steps. ?Where the edge of eac

## 2021-08-30 NOTE — Progress Notes (Signed)
?I connected with Jennifer [name] today by telephone and verified that I am speaking with the correct person using two identifiers. ?Location patient: home ?Location provider: work ?Persons participating in the virtual visit: patient, provider. ?  ?I discussed the limitations, risks, security and privacy concerns of performing an evaluation and management service by telephone and the availability of in person appointments. I also discussed with the patient that there may be a patient responsible charge related to this service. The patient expressed understanding and verbally consented to this telephonic visit.  ?  ?Interactive audio and video telecommunications were attempted between this provider and patient, however failed, due to patient having technical difficulties OR patient did not have access to video capability.  We continued and completed visit with audio only. ? ?Some vital signs may be absent or patient reported.  ? ?Time Spent with patient on telephone encounter: 30 minutes ? ?Subjective:  ? Jennifer [name] is a 76 y.o. female who presents for Medicare Annual (Subsequent) preventive examination. ? ?Review of Systems    ? ?Cardiac Risk Factors include: advanced age (>68mn, >>20women);dyslipidemia;family history of premature cardiovascular disease ? ?   ?Objective:  ?  ?There were no vitals filed for this visit. ?There is no height or weight on file to calculate BMI. ? ? ?  08/30/2021  ?  1:05 PM  ?Advanced Directives  ?Does Patient Have a Medical Advance Directive? Yes  ?Type of Advance Directive Living will;Healthcare Power of Attorney  ?Does patient want to make changes to medical advance directive? No - Patient declined  ?Copy of HFreeportin Chart? Yes - validated most recent copy scanned in chart (See row information)  ? ? ?Current Medications (verified) ?Outpatient Encounter Medications as of 08/30/2021  ?Medication Sig  ? Ascorbic Acid (VITAMIN C) 100 MG tablet Take 100 mg by  mouth 2 (two) times a week.   ? b complex vitamins tablet Take 1 tablet by mouth 2 (two) times a week.   ? Calcium Carb-Cholecalciferol (CALCIUM 500 + D3 PO) Take by mouth.  ? cholecalciferol (VITAMIN D) 1000 units tablet Take 1,000 Units by mouth daily.  ? magnesium oxide (MAG-OX) 400 MG tablet Take 400 mg by mouth daily.  ? meloxicam (MOBIC) 7.5 MG tablet Take 1 tablet (7.5 mg total) by mouth daily.  ? omega-3 acid ethyl esters (LOVAZA) 1 G capsule Take 1 g by mouth 2 (two) times a week.   ? rosuvastatin (CRESTOR) 5 MG tablet Take 5 mg by mouth 3 (three) times a week.  ? ?No facility-administered encounter medications on file as of 08/30/2021.  ? ? ?Allergies (verified) ?Latex and Penicillins  ? ?History: ?Past Medical History:  ?Diagnosis Date  ? Allergy   ? seasonal  ? Anemia   ? Arthritis   ? Cataract   ? forming  ? DJD of shoulder   ? Hyperlipidemia   ? Insomnia   ? Internal hemorrhoids   ? Tubular adenoma of colon   ? Vitamin D deficiency   ? ?Past Surgical History:  ?Procedure Laterality Date  ? COLONOSCOPY    ? FOOT SURGERY Left   ? FOOT SURGERY Right   ? HIATAL HERNIA REPAIR  04/18/1952  ? age 76 ? OOPHORECTOMY    ? POLYPECTOMY    ? SHOULDER ARTHROSCOPY Right   ? TOTAL ABDOMINAL HYSTERECTOMY W/ BILATERAL SALPINGOOPHORECTOMY  04/18/2005  ? WISDOM TOOTH EXTRACTION    ? as teenager  ? ?Family History  ?Problem Relation  Age of Onset  ? Hypertension Mother   ? Breast cancer Mother   ? Heart disease Mother   ? Heart disease Father   ? Heart attack Father 37  ?     deceased  ? Prostate cancer Maternal Uncle   ? Colon cancer Neg Hx   ? Colon polyps Neg Hx   ? Esophageal cancer Neg Hx   ? Rectal cancer Neg Hx   ? Stomach cancer Neg Hx   ? ?Social History  ? ?Socioeconomic History  ? Marital status: Married  ?  Spouse name: Not on file  ? Number of children: 2  ? Years of education: Not on file  ? Highest education level: Not on file  ?Occupational History  ? Not on file  ?Tobacco Use  ? Smoking status: Never  ?  Smokeless tobacco: Never  ?Substance and Sexual Activity  ? Alcohol use: Yes  ?  Alcohol/week: 0.0 standard drinks  ?  Comment: 3 per week  ? Drug use: No  ? Sexual activity: Yes  ?Other Topics Concern  ? Not on file  ?Social History Narrative  ? Not on file  ? ?Social Determinants of Health  ? ?Financial Resource Strain: Low Risk   ? Difficulty of Paying Living Expenses: Not hard at all  ?Food Insecurity: No Food Insecurity  ? Worried About Charity fundraiser in the Last Year: Never true  ? Ran Out of Food in the Last Year: Never true  ?Transportation Needs: No Transportation Needs  ? Lack of Transportation (Medical): No  ? Lack of Transportation (Non-Medical): No  ?Physical Activity: Sufficiently Active  ? Days of Exercise per Week: 5 days  ? Minutes of Exercise per Session: 30 min  ?Stress: No Stress Concern Present  ? Feeling of Stress : Not at all  ?Social Connections: Socially Integrated  ? Frequency of Communication with Friends and Family: More than three times a week  ? Frequency of Social Gatherings with Friends and Family: More than three times a week  ? Attends Religious Services: More than 4 times per year  ? Active Member of Clubs or Organizations: Yes  ? Attends Archivist Meetings: More than 4 times per year  ? Marital Status: Married  ? ? ?Tobacco Counseling ?Counseling given: Not Answered ? ? ?Clinical Intake: ? ?Pre-visit preparation completed: Yes ? ?Pain : No/denies pain ? ?  ? ?Nutritional Risks: None ?Diabetes: No ? ?How often do you need to have someone help you when you read instructions, pamphlets, or other written materials from your doctor or pharmacy?: 1 - Never ?What is the last grade level you completed in school?: B.A. Degree ? ?Diabetic? no ? ?Interpreter Needed?: No ? ?Information entered by :: Lisette Abu, LPN ? ? ?Activities of Daily Living ? ?  08/30/2021  ?  1:13 PM 03/02/2021  ?  9:25 AM  ?In your present state of health, do you have any difficulty performing the  following activities:  ?Hearing? 1 1  ?Comment wears hearing aids   ?Vision? 0 0  ?Difficulty concentrating or making decisions? 0 0  ?Comment cognitive intact   ?Walking or climbing stairs? 0 0  ?Dressing or bathing? 0 0  ?Doing errands, shopping? 0 0  ?Preparing Food and eating ? N   ?Using the Toilet? N   ?In the past six months, have you accidently leaked urine? N   ?Do you have problems with loss of bowel control? N   ?Managing your  Medications? N   ?Managing your Finances? N   ?Housekeeping or managing your Housekeeping? N   ? ? ?Patient Care Team: ?[name], MD as PCP - General (Internal Medicine) ? ?Indicate any recent Medical Services you may have received from other than [facility] providers in the past year (date may be approximate). ? ?   ?Assessment:  ? This is a routine wellness examination for Jennifer [name]. ? ?Hearing/Vision screen ?No results found. ? ?Dietary issues and exercise activities discussed: ?Current Exercise Habits: Home exercise routine, Type of exercise: walking, Time (Minutes): 30, Frequency (Times/Week): 5, Weekly Exercise (Minutes/Week): 150, Intensity: Moderate, Exercise limited by: None identified ? ? Goals Addressed   ? ?  ?  ?  ?  ? This Visit's Progress  ?  My goal is to continue to eat healthy, stay independent and active.     ? ?  ?Depression Screen ? ?  08/30/2021  ?  1:08 PM 03/02/2021  ?  9:25 AM 05/24/2013  ?  2:21 PM 05/01/2012  ? 11:33 AM  ?PHQ 2/9 Scores  ?PHQ - 2 Score 0 0 0 0  ?  ?Fall Risk ? ?  08/30/2021  ?  1:06 PM 03/02/2021  ?  9:24 AM 05/24/2013  ?  2:21 PM 05/01/2012  ? 11:33 AM  ?Fall Risk   ?Falls in the past year? 0 0 No No  ?Number falls in past yr: 0 0    ?Injury with Fall? 0 0    ?Risk for fall due to : No Fall Risks     ?Follow up Falls evaluation completed     ? ? ?FALL RISK PREVENTION PERTAINING TO THE HOME: ? ?Any stairs in or around the home? No  ?If so, are there any without handrails? No  ?Home free of loose throw rugs in walkways, pet beds, electrical cords, etc?  Yes  ?Adequate lighting in your home to reduce risk of falls? Yes  ? ?ASSISTIVE DEVICES UTILIZED TO PREVENT FALLS: ? ?Life alert? No  ?Use of a cane, walker or w/c? No  ?Grab bars in the bathroom? Yes  ?Sh

## 2021-08-31 ENCOUNTER — Ambulatory Visit (INDEPENDENT_AMBULATORY_CARE_PROVIDER_SITE_OTHER): Payer: Medicare Other | Admitting: Family Medicine

## 2021-08-31 VITALS — BP 124/88 | HR 63 | Ht 65.0 in | Wt 127.0 lb

## 2021-08-31 DIAGNOSIS — M9902 Segmental and somatic dysfunction of thoracic region: Secondary | ICD-10-CM | POA: Diagnosis not present

## 2021-08-31 DIAGNOSIS — M9903 Segmental and somatic dysfunction of lumbar region: Secondary | ICD-10-CM | POA: Diagnosis not present

## 2021-08-31 DIAGNOSIS — M9904 Segmental and somatic dysfunction of sacral region: Secondary | ICD-10-CM

## 2021-08-31 DIAGNOSIS — M9908 Segmental and somatic dysfunction of rib cage: Secondary | ICD-10-CM

## 2021-08-31 DIAGNOSIS — M19011 Primary osteoarthritis, right shoulder: Secondary | ICD-10-CM | POA: Diagnosis not present

## 2021-08-31 DIAGNOSIS — M5136 Other intervertebral disc degeneration, lumbar region: Secondary | ICD-10-CM | POA: Diagnosis not present

## 2021-08-31 DIAGNOSIS — M9901 Segmental and somatic dysfunction of cervical region: Secondary | ICD-10-CM | POA: Diagnosis not present

## 2021-08-31 NOTE — Assessment & Plan Note (Signed)
Chronic but stable.  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  Continue conservative therapy ?

## 2021-08-31 NOTE — Assessment & Plan Note (Signed)
Chronic, with exacerbation again.  Discussed with patient icing regimen and home exercises.  Will be traveling to [place] in the future though.  Does respond to manipulation we do start it as well.  We discussed the possibility of other treatment options which would include injection.  Patient will follow-up with me again in 6 weeks otherwise the patient does have the meloxicam for breakthrough. ?

## 2021-08-31 NOTE — Patient Instructions (Signed)
Good to see you! ?Jennifer [name] with family ?See you again in 5-6 weeks ?

## 2021-09-02 ENCOUNTER — Ambulatory Visit: Payer: Medicare Other | Admitting: Family Medicine

## 2021-09-17 DIAGNOSIS — M4604 Spinal enthesopathy, thoracic region: Secondary | ICD-10-CM | POA: Diagnosis not present

## 2021-09-17 DIAGNOSIS — M9903 Segmental and somatic dysfunction of lumbar region: Secondary | ICD-10-CM | POA: Diagnosis not present

## 2021-09-17 DIAGNOSIS — M9902 Segmental and somatic dysfunction of thoracic region: Secondary | ICD-10-CM | POA: Diagnosis not present

## 2021-09-17 DIAGNOSIS — M9901 Segmental and somatic dysfunction of cervical region: Secondary | ICD-10-CM | POA: Diagnosis not present

## 2021-10-01 NOTE — Progress Notes (Unsigned)
Jennifer [name] [address] [place] Phone: [phone] Subjective:   [name], am serving as a scribe for Dr. Hulan Saas.  I'm seeing this patient by the request  of:  [name], MD  CC: back and neck pain   NID:[id number]  [name] is a 76 y.o. female coming in with complaint of back and neck pain. OMT on 08/31/2021. Patient states same per usual and feet. No new complaints.  Patient still having tightness in the lower back but not stopping her from activities at the moment.  Continues to take vitamin D.  Patient states nothing that is stopping her from activity at this moment.  Medications patient has been prescribed: None  Taking:         Reviewed prior external information including notes and imaging from previsou exam, outside providers and external EMR if available.   As well as notes that were available from care everywhere and other healthcare systems.  Past medical history, social, surgical and family history all reviewed in electronic medical record.  No pertanent information unless stated regarding to the chief complaint.   Past Medical History:  Diagnosis Date   Allergy    seasonal   Anemia    Arthritis    Cataract    forming   DJD of shoulder    Hyperlipidemia    Insomnia    Internal hemorrhoids    Tubular adenoma of colon    Vitamin D deficiency     Allergies  Allergen Reactions   Latex Other (See Comments)    Patient preference  Sensitivity    Penicillins     REACTION: Childhood reaction     Review of Systems:  No headache, visual changes, nausea, vomiting, diarrhea, constipation, dizziness, abdominal pain, skin rash, fevers, chills, night sweats, weight loss, swollen lymph nodes, body aches, joint swelling, chest pain, shortness of breath, mood changes. POSITIVE muscle aches  Objective  Blood pressure 118/70, pulse 81, height '5\' 5"'$  (1.651 m), weight 125 lb (56.7 kg), SpO2  98 %.   General: No apparent distress alert and oriented x3 mood and affect normal, dressed appropriately.  HEENT: Pupils equal, extraocular movements intact  Respiratory: Patient's speak in full sentences and does not appear short of breath  Cardiovascular: No lower extremity edema, non tender, no erythema  Gait MSK:  Back significant scoliosis noted.  Patient does have some arthritic changes multiple joints.  Patient does have range of motion noted.  Significant arthritic changes of multiple joints.  Significant scoliosis of the back noted.  Osteopathic findings  C3 flexed rotated and side bent right C7 flexed rotated and side bent left T3 extended rotated and side bent right inhaled rib T9 extended rotated and side bent left L2 flexed rotated and side bent right Sacrum right on right       Assessment and Plan:  Degenerative disc disease, lumbar Degenerative disc disease.  Discussed icing regimen and home exercises.  Patient still responding well to the osteopathic activities to do and which ones to avoid.  Osteoporosis Chronic at this moment.  Continue to monitor.  Follow-up again in 6 to 8 weeks    Nonallopathic problems  Decision today to treat with OMT was based on Physical Exam  After verbal consent patient was treated with HVLA, ME, FPR techniques in cervical, rib, thoracic, lumbar, and sacral  areas  Patient tolerated the procedure well with improvement in symptoms  Patient given exercises, stretches  and lifestyle modifications  See medications in patient instructions if given  Patient will follow up in 8 weeks             Note: This dictation was prepared with Dragon dictation along with smaller phrase technology. Any transcriptional errors that result from this process are unintentional.

## 2021-10-04 ENCOUNTER — Ambulatory Visit (INDEPENDENT_AMBULATORY_CARE_PROVIDER_SITE_OTHER): Payer: Medicare Other | Admitting: Family Medicine

## 2021-10-04 VITALS — BP 118/70 | HR 81 | Ht 65.0 in | Wt 125.0 lb

## 2021-10-04 DIAGNOSIS — M9904 Segmental and somatic dysfunction of sacral region: Secondary | ICD-10-CM | POA: Diagnosis not present

## 2021-10-04 DIAGNOSIS — M9901 Segmental and somatic dysfunction of cervical region: Secondary | ICD-10-CM | POA: Diagnosis not present

## 2021-10-04 DIAGNOSIS — M810 Age-related osteoporosis without current pathological fracture: Secondary | ICD-10-CM | POA: Diagnosis not present

## 2021-10-04 DIAGNOSIS — M9902 Segmental and somatic dysfunction of thoracic region: Secondary | ICD-10-CM | POA: Diagnosis not present

## 2021-10-04 DIAGNOSIS — M9903 Segmental and somatic dysfunction of lumbar region: Secondary | ICD-10-CM

## 2021-10-04 DIAGNOSIS — M9908 Segmental and somatic dysfunction of rib cage: Secondary | ICD-10-CM | POA: Diagnosis not present

## 2021-10-04 DIAGNOSIS — M5136 Other intervertebral disc degeneration, lumbar region: Secondary | ICD-10-CM

## 2021-10-04 NOTE — Assessment & Plan Note (Signed)
Chronic at this moment.  Continue to monitor.  Follow-up again in 6 to 8 weeks

## 2021-10-04 NOTE — Assessment & Plan Note (Signed)
Degenerative disc disease.  Discussed icing regimen and home exercises.  Patient still responding well to the osteopathic activities to do and which ones to avoid.

## 2021-10-04 NOTE — Patient Instructions (Addendum)
Good to see you! Recovery sandals: [place] or Oofos Doing good overall See you again in 6-8 weeks

## 2021-11-15 NOTE — Progress Notes (Unsigned)
Jennifer [name] [place] [address] [place] Phone: [phone] Subjective:   [name], am serving as a scribe for Dr. Hulan Saas.  I'm seeing this patient by the request  of:  [name], MD  CC: Low back pain follow-up  [id number]  Jennifer [name] is a 76 y.o. female coming in with complaint of back and neck pain. OMT on 10/04/2021. Patient states doing well. Here for manipulation. No new issues.  Medications patient has been prescribed: None  Taking:         Reviewed prior external information including notes and imaging from previsou exam, outside providers and external EMR if available.   As well as notes that were available from care everywhere and other healthcare systems.  Past medical history, social, surgical and family history all reviewed in electronic medical record.  No pertanent information unless stated regarding to the chief complaint.   Past Medical History:  Diagnosis Date   Allergy    seasonal   Anemia    Arthritis    Cataract    forming   DJD of shoulder    Hyperlipidemia    Insomnia    Internal hemorrhoids    Tubular adenoma of colon    Vitamin D deficiency     Allergies  Allergen Reactions   Latex Other (See Comments)    Patient preference  Sensitivity    Penicillins     REACTION: Childhood reaction     Review of Systems:  No headache, visual changes, nausea, vomiting, diarrhea, constipation, dizziness, abdominal pain, skin rash, fevers, chills, night sweats, weight loss, swollen lymph nodes, body aches, joint swelling, chest pain, shortness of breath, mood changes. POSITIVE muscle aches  Objective  Blood pressure 128/72, pulse 72, height '5\' 5"'$  (1.651 m), weight 127 lb (57.6 kg), SpO2 98 %.   General: No apparent distress alert and oriented x3 mood and affect normal, dressed appropriately.  HEENT: Pupils equal, extraocular movements intact  Respiratory: Patient's speak in  full sentences and does not appear short of breath  Cardiovascular: No lower extremity edema, non tender, no erythema  Gait normal MSK:  Back mild loss of lordosis patient does have tightness noted.  Seems to still have the mild scoliosis.  Arthritic changes noted of the right shoulder.  Does have atrophy of the right shoulder and the shoulder girdle area.  Osteopathic findings T4 extended rotated and side bent right T8 extended rotated and side bent left L2 flexed rotated and side bent right Sacrum right on right     Assessment and Plan:  Degenerative disc disease, lumbar Significant arthritic changes noted.  Discussed posture and ergonomics.  Discussed which activities to do and which ones to avoid.  Increase activity slowly otherwise.  Still responding well to osteopathic manipulation.  Had meloxicam for breakthrough pain.  We will follow-up again in 6 to 8 weeks chronic problem with mild exacerbation.    Nonallopathic problems  Decision today to treat with OMT was based on Physical Exam  After verbal consent patient was treated with HVLA, ME, FPR techniques in  thoracic, lumbar, and sacral  areas  Patient tolerated the procedure well with improvement in symptoms  Patient given exercises, stretches and lifestyle modifications  See medications in patient instructions if given  Patient will follow up in 4-8 weeks     The above documentation has been reviewed and is accurate and complete [name], DO  Note: This dictation was prepared with Dragon dictation along with smaller phrase technology. Any transcriptional errors that result from this process are unintentional.

## 2021-11-16 ENCOUNTER — Ambulatory Visit (INDEPENDENT_AMBULATORY_CARE_PROVIDER_SITE_OTHER): Payer: Medicare Other | Admitting: Family Medicine

## 2021-11-16 VITALS — BP 128/72 | HR 72 | Ht 65.0 in | Wt 127.0 lb

## 2021-11-16 DIAGNOSIS — M5136 Other intervertebral disc degeneration, lumbar region: Secondary | ICD-10-CM | POA: Diagnosis not present

## 2021-11-16 DIAGNOSIS — M9902 Segmental and somatic dysfunction of thoracic region: Secondary | ICD-10-CM

## 2021-11-16 DIAGNOSIS — M9903 Segmental and somatic dysfunction of lumbar region: Secondary | ICD-10-CM

## 2021-11-16 DIAGNOSIS — M9904 Segmental and somatic dysfunction of sacral region: Secondary | ICD-10-CM | POA: Diagnosis not present

## 2021-11-16 NOTE — Assessment & Plan Note (Signed)
Significant arthritic changes noted.  Discussed posture and ergonomics.  Discussed which activities to do and which ones to avoid.  Increase activity slowly otherwise.  Still responding well to osteopathic manipulation.  Had meloxicam for breakthrough pain.  We will follow-up again in 6 to 8 weeks chronic problem with mild exacerbation.

## 2021-11-16 NOTE — Patient Instructions (Signed)
Great to see you as always  Ice is your friend Newtons shoes or Altra shoes See me again in 8 weeks

## 2021-11-26 ENCOUNTER — Telehealth: Payer: Self-pay

## 2021-11-26 NOTE — Telephone Encounter (Signed)
Pt was calling today to check the status of the reversal of 295$ charge for being scheduled for AWV 1 month to soon.  Original message 10/29/21:  Charges for AWV Received: 4 weeks ago Rose Phi, CMA  [place], [place] Pt is calling after receiving a notice from medicare stating that she was being charged 295$ for AWV. Pt reports that it was completed 1 month to early according to the insurance.   The LPN that completes these appts looked and she stated that we haven't completed this in the past first one for [facility] system at least since 2021.   Going forward how should we proceed so this doesn't occur again for her or any other pt.   Please advise   I was advised to email Charge correction. I did so today. I advised the pt that I would reach out to her if they get back in touch with me. Pt states that she will reach back out on Next Friday for update due to the last noticed she received saying it would be going to collections.  FYI

## 2021-12-01 NOTE — Telephone Encounter (Signed)
As of today 12/01/21 @ 11:36 I received an e-mail that states that the acct was noted and the charges was voided.  I left VM for pt with the information and advised she could call back with any concerns or questions.   FYI

## 2021-12-27 NOTE — Progress Notes (Unsigned)
[place] [address] [place] Phone: [phone] Subjective:   Jennifer [name], am serving as a scribe for Dr. Hulan Saas.  I'm seeing this patient by the request  of:  [name], MD  CC: Low back pain, foot pain follow-up  [name]  Jennifer [name] is a 76 y.o. female coming in with complaint of back and neck pain. OMT on 11/16/2021. Patient states doing well. Same per usual. No new complaints.  Medications patient has been prescribed: None  Taking:         Reviewed prior external information including notes and imaging from previsou exam, outside providers and external EMR if available.   As well as notes that were available from care everywhere and other healthcare systems.  Past medical history, social, surgical and family history all reviewed in electronic medical record.  No pertanent information unless stated regarding to the chief complaint.   Past Medical History:  Diagnosis Date   Allergy    seasonal   Anemia    Arthritis    Cataract    forming   DJD of shoulder    Hyperlipidemia    Insomnia    Internal hemorrhoids    Tubular adenoma of colon    Vitamin D deficiency     Allergies  Allergen Reactions   Latex Other (See Comments)    Patient preference  Sensitivity    Penicillins     REACTION: Childhood reaction     Review of Systems:  No headache, visual changes, nausea, vomiting, diarrhea, constipation, dizziness, abdominal pain, skin rash, fevers, chills, night sweats, weight loss, swollen lymph nodes, body aches, joint swelling, chest pain, shortness of breath, mood changes. POSITIVE muscle aches  Objective  Blood pressure 116/66, pulse 67, height '5\' 5"'$  (1.651 m), weight 127 lb (57.6 kg), SpO2 98 %.   General: No apparent distress alert and oriented x3 mood and affect normal, dressed appropriately.  HEENT: Pupils equal, extraocular movements intact  Respiratory: Patient's  speak in full sentences and does not appear short of breath  Cardiovascular: No lower extremity edema, non tender, no erythema  MSK:  Back back does have tenderness scoliosis.  Tightness in the parascapular region as well.  Neck exam has some limited sidebending bilaterally.  Does have significant arthritic changes with atrophy of the shoulder right greater than left.  Crepitus noted.  Osteopathic findings  C2 flexed rotated and side bent right C6 flexed rotated and side bent left T3 extended rotated and side bent left inhaled rib L1 flexed rotated and side bent right Sacrum right on right       Assessment and Plan:  Degenerative disc disease, lumbar Significant arthritic changes noted of the.  The patient is responding well to osteopathic manipulation.  We discussed with the foot breakdown which shoes would be more beneficial.  Follow-up again in 6 to 8 weeks.    Nonallopathic problems  Decision today to treat with OMT was based on Physical Exam  After verbal consent patient was treated with  ME, FPR techniques in cervical, rib, thoracic, lumbar, and sacral  areas  Patient tolerated the procedure well with improvement in symptoms  Patient given exercises, stretches and lifestyle modifications  See medications in patient instructions if given  Patient will follow up in 4-8 weeks    The above documentation has been reviewed and is accurate and complete Jennifer Pulley, DO          Note:  This dictation was prepared with Dragon dictation along with smaller phrase technology. Any transcriptional errors that result from this process are unintentional.

## 2021-12-28 ENCOUNTER — Ambulatory Visit (INDEPENDENT_AMBULATORY_CARE_PROVIDER_SITE_OTHER): Payer: Medicare Other | Admitting: Family Medicine

## 2021-12-28 VITALS — BP 116/66 | HR 67 | Ht 65.0 in | Wt 127.0 lb

## 2021-12-28 DIAGNOSIS — M9902 Segmental and somatic dysfunction of thoracic region: Secondary | ICD-10-CM

## 2021-12-28 DIAGNOSIS — M9908 Segmental and somatic dysfunction of rib cage: Secondary | ICD-10-CM

## 2021-12-28 DIAGNOSIS — M5136 Other intervertebral disc degeneration, lumbar region: Secondary | ICD-10-CM

## 2021-12-28 DIAGNOSIS — M9901 Segmental and somatic dysfunction of cervical region: Secondary | ICD-10-CM

## 2021-12-28 DIAGNOSIS — M9904 Segmental and somatic dysfunction of sacral region: Secondary | ICD-10-CM

## 2021-12-28 DIAGNOSIS — M9903 Segmental and somatic dysfunction of lumbar region: Secondary | ICD-10-CM

## 2021-12-28 NOTE — Assessment & Plan Note (Signed)
Significant arthritic changes noted of the.  The patient is responding well to osteopathic manipulation.  We discussed with the foot breakdown which shoes would be more beneficial.  Follow-up again in 6 to 8 weeks.

## 2021-12-28 NOTE — Patient Instructions (Addendum)
Good to see you! CoQ10 '200mg'$  with statin

## 2022-01-26 DIAGNOSIS — Z23 Encounter for immunization: Secondary | ICD-10-CM | POA: Diagnosis not present

## 2022-02-04 NOTE — Progress Notes (Unsigned)
[place] Phone: [phone] Subjective:   Jennifer [name], am serving as a scribe for Dr. Hulan Saas.  I'm seeing this patient by the request  of:  [name], MD  CC: Back and neck pain follow-up  [id number]  Jennifer [name] is a 76 y.o. female coming in with complaint of back and neck pain. OMT on 12/28/2021. Patient states that she needs a tune up. Back and neck pain are unchanged.   Patient concerned about hammering of second toes on both feet due to her bunions.   Medications patient has been prescribed: None         Reviewed prior external information including notes and imaging from previsou exam, outside providers and external EMR if available.   As well as notes that were available from care everywhere and other healthcare systems.  Past medical history, social, surgical and family history all reviewed in electronic medical record.  [name] pertanent information unless stated regarding to the chief complaint.   Past Medical History:  Diagnosis Date   Allergy    seasonal   Anemia    Arthritis    Cataract    forming   DJD of shoulder    Hyperlipidemia    Insomnia    Internal hemorrhoids    Tubular adenoma of colon    Vitamin D deficiency     Allergies  Allergen Reactions   Latex Other (See Comments)    Patient preference  Sensitivity    Penicillins     REACTION: Childhood reaction     Review of Systems:  [name] headache, visual changes, nausea, vomiting, diarrhea, constipation, dizziness, abdominal pain, skin rash, fevers, chills, night sweats, weight loss, swollen lymph nodes, body aches, joint swelling, chest pain, shortness of breath, mood changes. POSITIVE muscle aches  Objective  Blood pressure 128/74, pulse 66, height '5\' 5"'$  (1.651 m), weight 128 lb (58.1 kg), SpO2 99 %.   General: [name] apparent distress alert and oriented x3 mood and affect normal, dressed  appropriately.  HEENT: Pupils equal, extraocular movements intact  Respiratory: Patient's speak in full sentences and does not appear short of breath  Cardiovascular: [name] lower extremity edema, non tender, [name] erythema  MSK:  Back: Low back exam does have loss of lordosis patient does have some mild degenerative scoliosis noted as well.  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  Patient does have some arthritic changes of the hands as well.   Osteopathic findings  C6 flexed rotated and side bent left T3 extended rotated and side bent right inhaled rib T7 extended rotated and side bent left L2 flexed rotated and side bent right Sacrum right on right     Assessment and Plan:  Degenerative disc disease, lumbar Low back exam does have loss of lordosis  Positive faber  meloxicam daily PRN RTC in 8 weeks     Nonallopathic problems  Decision today to treat with OMT was based on Physical Exam  After verbal consent patient was treated with HVLA, ME, FPR techniques in cervical, rib, thoracic, lumbar, and sacral  areas  Patient tolerated the procedure well with improvement in symptoms  Patient given exercises, stretches and lifestyle modifications  See medications in patient instructions if given  Patient will follow up in 6-8 weeks    The above documentation has been reviewed and is accurate and complete [name], DO  Note: This dictation was prepared with Dragon dictation along with smaller phrase technology. Any transcriptional errors that result from this process are unintentional.

## 2022-02-05 DIAGNOSIS — Z23 Encounter for immunization: Secondary | ICD-10-CM | POA: Diagnosis not present

## 2022-02-08 ENCOUNTER — Ambulatory Visit (INDEPENDENT_AMBULATORY_CARE_PROVIDER_SITE_OTHER): Payer: Medicare Other | Admitting: Family Medicine

## 2022-02-08 VITALS — BP 128/74 | HR 66 | Ht 65.0 in | Wt 128.0 lb

## 2022-02-08 DIAGNOSIS — M9903 Segmental and somatic dysfunction of lumbar region: Secondary | ICD-10-CM

## 2022-02-08 DIAGNOSIS — M9908 Segmental and somatic dysfunction of rib cage: Secondary | ICD-10-CM

## 2022-02-08 DIAGNOSIS — M9902 Segmental and somatic dysfunction of thoracic region: Secondary | ICD-10-CM

## 2022-02-08 DIAGNOSIS — M9901 Segmental and somatic dysfunction of cervical region: Secondary | ICD-10-CM | POA: Diagnosis not present

## 2022-02-08 DIAGNOSIS — M5136 Other intervertebral disc degeneration, lumbar region: Secondary | ICD-10-CM

## 2022-02-08 DIAGNOSIS — M9904 Segmental and somatic dysfunction of sacral region: Secondary | ICD-10-CM

## 2022-02-08 NOTE — Assessment & Plan Note (Signed)
Low back exam does have loss of lordosis  Positive faber  meloxicam daily PRN RTC in 8 weeks

## 2022-02-08 NOTE — Patient Instructions (Addendum)
Topo or [employer] boost [employer] Only 5 exercises at most for your shoulder

## 2022-02-14 ENCOUNTER — Telehealth: Payer: Self-pay | Admitting: Internal Medicine

## 2022-02-14 NOTE — Telephone Encounter (Signed)
Patient states that when she picked up her refill for  rosuvastatin (CRESTOR) 5 MG tablet  the pharmacy gave her '10mg'$  instead of the '5mg'$  that she is used to. She is asking if we can check with the pharmacy to see what is going on with the prescription and call her back and let her know what she should do.

## 2022-02-15 ENCOUNTER — Other Ambulatory Visit: Payer: Self-pay

## 2022-02-15 MED ORDER — ROSUVASTATIN CALCIUM 5 MG PO TABS: 5.0000 mg | ORAL_TABLET | ORAL | 2 refills | Status: DC

## 2022-02-15 NOTE — Telephone Encounter (Signed)
Spoke with patient today. 

## 2022-03-03 NOTE — Progress Notes (Unsigned)
Subjective:    Patient ID: Jennifer [name], female    DOB: 07-26-1945, 76 y.o.   MRN: [id number]     HPI Garry is here for follow up of her chronic medical problems, including allergic rhinitis, OP, hyperlipidemia with aortic atherosclerosis, vit d def    She is exercising regularly.  She is no longer taking the Crestor 3 times a week, but twice a week.  She was getting some muscle aches and she is tolerating it now it twice weekly.   Medications and allergies reviewed with patient and updated if appropriate.  Current Outpatient Medications on File Prior to Visit  Medication Sig Dispense Refill   b complex vitamins tablet Take 1 tablet by mouth 2 (two) times a week.      Calcium Carb-Cholecalciferol (CALCIUM 500 + D3 PO) Take by mouth.     cholecalciferol (VITAMIN D) 1000 units tablet Take 5,000 Units by mouth daily.     magnesium oxide (MAG-OX) 400 MG tablet Take 400 mg by mouth daily.     omega-3 acid ethyl esters (LOVAZA) 1 G capsule Take 1 g by mouth 2 (two) times a week.      No current facility-administered medications on file prior to visit.     Review of Systems  Constitutional:  Negative for chills and fever.  Respiratory:  Negative for cough, shortness of breath and wheezing.   Cardiovascular:  Negative for chest pain, palpitations and leg swelling.  Neurological:  Negative for light-headedness and headaches.       Objective:   Vitals:   03/04/22 1316  BP: 110/72  Pulse: 65  Temp: 97.8 F (36.6 C)  SpO2: 99%   BP Readings from Last 3 Encounters:  03/04/22 110/72  02/08/22 128/74  12/28/21 116/66   Wt Readings from Last 3 Encounters:  03/04/22 126 lb (57.2 kg)  02/08/22 128 lb (58.1 kg)  12/28/21 127 lb (57.6 kg)   Body mass index is 20.97 kg/m.    Physical Exam Constitutional:      General: She is not in acute distress.    Appearance: Normal appearance.  HENT:     Head: Normocephalic and atraumatic.  Eyes:      Conjunctiva/sclera: Conjunctivae normal.  Cardiovascular:     Rate and Rhythm: Normal rate and regular rhythm.     Heart sounds: Normal heart sounds. No murmur heard. Pulmonary:     Effort: Pulmonary effort is normal. No respiratory distress.     Breath sounds: Normal breath sounds. No wheezing.  Abdominal:     General: There is no distension.     Palpations: Abdomen is soft.     Tenderness: There is no abdominal tenderness.  Musculoskeletal:     Cervical back: Neck supple.     Right lower leg: No edema.     Left lower leg: No edema.  Lymphadenopathy:     Cervical: No cervical adenopathy.  Skin:    General: Skin is warm and dry.     Findings: No rash.  Neurological:     Mental Status: She is alert. Mental status is at baseline.  Psychiatric:        Mood and Affect: Mood normal.        Behavior: Behavior normal.        Lab Results  Component Value Date   WBC 5.7 05/24/2013   HGB 12.8 05/24/2013   HCT 38.1 05/24/2013   PLT 271.0 05/24/2013   GLUCOSE 65 (L) 05/24/2013  CHOL 233 (H) 05/24/2013   TRIG 54.0 05/24/2013   HDL 88.10 05/24/2013   LDLDIRECT 131.6 05/24/2013   LDLCALC 89 02/07/2008   ALT 23 05/24/2013   AST 25 05/24/2013   NA 137 05/24/2013   K 4.9 05/24/2013   CL 103 05/24/2013   CREATININE 0.7 05/24/2013   BUN 15 05/24/2013   CO2 24 05/24/2013   TSH 0.94 05/24/2013     Assessment & Plan:    See Problem List for Assessment and Plan of chronic medical problems.

## 2022-03-03 NOTE — Patient Instructions (Addendum)
      Blood work was ordered.   The lab is on the first floor.    A bone density scan was ordered for the breast center.   Medications changes include :   none     Return in about 1 year (around 03/05/2023) for follow up.

## 2022-03-04 ENCOUNTER — Ambulatory Visit (INDEPENDENT_AMBULATORY_CARE_PROVIDER_SITE_OTHER): Payer: Medicare Other | Admitting: Internal Medicine

## 2022-03-04 ENCOUNTER — Encounter: Payer: Self-pay | Admitting: Internal Medicine

## 2022-03-04 VITALS — BP 110/72 | HR 65 | Temp 97.8°F | Ht 65.0 in | Wt 126.0 lb

## 2022-03-04 DIAGNOSIS — J309 Allergic rhinitis, unspecified: Secondary | ICD-10-CM

## 2022-03-04 DIAGNOSIS — M810 Age-related osteoporosis without current pathological fracture: Secondary | ICD-10-CM

## 2022-03-04 DIAGNOSIS — E7849 Other hyperlipidemia: Secondary | ICD-10-CM | POA: Diagnosis not present

## 2022-03-04 DIAGNOSIS — I700 Atherosclerosis of aorta: Secondary | ICD-10-CM

## 2022-03-04 DIAGNOSIS — E559 Vitamin D deficiency, unspecified: Secondary | ICD-10-CM | POA: Diagnosis not present

## 2022-03-04 MED ORDER — ROSUVASTATIN CALCIUM 5 MG PO TABS: 5.0000 mg | ORAL_TABLET | ORAL | 3 refills | Status: DC

## 2022-03-04 NOTE — Assessment & Plan Note (Signed)
Chronic PND and cough Some associated cervical lymphadenopathy Typically does not take anything for her symptoms

## 2022-03-04 NOTE — Assessment & Plan Note (Addendum)
Chronic Regular exercise and healthy diet encouraged Check lipid panel, CMP Continue Crestor 5 mg 2 times weekly

## 2022-03-04 NOTE — Assessment & Plan Note (Signed)
Chronic DEXA due-ordered Stressed regular exercise Continue calcium and vitamin D daily Check vitamin D level Discussed risks of fracture and briefly discussed medication options and risks/benefits-she would prefer not to take any medication

## 2022-03-04 NOTE — Assessment & Plan Note (Signed)
Chronic Taking vitamin D daily Check vitamin D level  

## 2022-03-04 NOTE — Assessment & Plan Note (Signed)
Chronic Continue rosuvastatin 5 mg 3 times weekly Check lipid panel, CBC, CMP

## 2022-03-07 ENCOUNTER — Other Ambulatory Visit (INDEPENDENT_AMBULATORY_CARE_PROVIDER_SITE_OTHER): Payer: Medicare Other

## 2022-03-07 DIAGNOSIS — M810 Age-related osteoporosis without current pathological fracture: Secondary | ICD-10-CM | POA: Diagnosis not present

## 2022-03-07 DIAGNOSIS — E7849 Other hyperlipidemia: Secondary | ICD-10-CM | POA: Diagnosis not present

## 2022-03-07 DIAGNOSIS — E559 Vitamin D deficiency, unspecified: Secondary | ICD-10-CM | POA: Diagnosis not present

## 2022-03-07 DIAGNOSIS — I700 Atherosclerosis of aorta: Secondary | ICD-10-CM | POA: Diagnosis not present

## 2022-03-07 LAB — CBC WITH DIFFERENTIAL/PLATELET
Basophils Absolute: 0 10*3/uL (ref 0.0–0.1)
Basophils Relative: 0.6 % (ref 0.0–3.0)
Eosinophils Absolute: 0 10*3/uL (ref 0.0–0.7)
Eosinophils Relative: 1.4 % (ref 0.0–5.0)
HCT: 39.6 % (ref 36.0–46.0)
Hemoglobin: 13.5 g/dL (ref 12.0–15.0)
Lymphocytes Relative: 33.4 % (ref 12.0–46.0)
Lymphs Abs: 1.1 10*3/uL (ref 0.7–4.0)
MCHC: 34 g/dL (ref 30.0–36.0)
MCV: 91.5 fl (ref 78.0–100.0)
Monocytes Absolute: 0.6 10*3/uL (ref 0.1–1.0)
Monocytes Relative: 18.1 % — ABNORMAL HIGH (ref 3.0–12.0)
Neutro Abs: 1.5 10*3/uL (ref 1.4–7.7)
Neutrophils Relative %: 46.5 % (ref 43.0–77.0)
Platelets: 286 10*3/uL (ref 150.0–400.0)
RBC: 4.33 Mil/uL (ref 3.87–5.11)
RDW: 13.6 % (ref 11.5–15.5)
WBC: 3.3 10*3/uL — ABNORMAL LOW (ref 4.0–10.5)

## 2022-03-07 LAB — COMPREHENSIVE METABOLIC PANEL
ALT: 24 U/L (ref 0–35)
AST: 21 U/L (ref 0–37)
Albumin: 4.2 g/dL (ref 3.5–5.2)
Alkaline Phosphatase: 77 U/L (ref 39–117)
BUN: 14 mg/dL (ref 6–23)
CO2: 27 mEq/L (ref 19–32)
Calcium: 9.3 mg/dL (ref 8.4–10.5)
Chloride: 103 mEq/L (ref 96–112)
Creatinine, Ser: 0.8 mg/dL (ref 0.40–1.20)
GFR: 72.32 mL/min (ref >60.00)
Glucose, Bld: 83 mg/dL (ref 70–99)
Potassium: 4 mEq/L (ref 3.5–5.1)
Sodium: 138 mEq/L (ref 135–145)
Total Bilirubin: 0.4 mg/dL (ref 0.2–1.2)
Total Protein: 6.8 g/dL (ref 6.0–8.3)

## 2022-03-07 LAB — LIPID PANEL
Cholesterol: 193 mg/dL (ref 0–200)
HDL: 77.9 mg/dL (ref >39.00)
LDL Cholesterol: 98 mg/dL (ref 0–99)
NonHDL: 115.54
Total CHOL/HDL Ratio: 2
Triglycerides: 87 mg/dL (ref 0.0–149.0)
VLDL: 17.4 mg/dL (ref 0.0–40.0)

## 2022-03-07 LAB — VITAMIN D 25 HYDROXY (VIT D DEFICIENCY, FRACTURES): VITD: 94.76 ng/mL (ref 30.00–100.00)

## 2022-03-10 LAB — LIPOPROTEIN A (LPA): Lipoprotein (a): 83 nmol/L — ABNORMAL HIGH (ref <75)

## 2022-03-11 ENCOUNTER — Encounter: Payer: Self-pay | Admitting: Internal Medicine

## 2022-03-11 DIAGNOSIS — E7841 Elevated Lipoprotein(a): Secondary | ICD-10-CM | POA: Insufficient documentation

## 2022-03-22 ENCOUNTER — Encounter: Payer: Self-pay | Admitting: Internal Medicine

## 2022-03-23 MED ORDER — ROSUVASTATIN CALCIUM 5 MG PO TABS: 5.0000 mg | ORAL_TABLET | ORAL | 3 refills | Status: DC

## 2022-04-04 NOTE — Progress Notes (Unsigned)
[place] Phone: [phone] Subjective:   [name], am serving as a scribe for Dr. Hulan Saas.  I'm seeing this patient by the request  of:  [name], MD  CC: Neck and back pain follow-up  [id number]  Jovan Schickling is a 76 y.o. female coming in with complaint of back and neck pain. OMT 02/08/2022. Patient states that she did feel some cramping in R glute but that subsided and she has been doing well.   Medications patient has been prescribed: None  Taking:         Reviewed prior external information including notes and imaging from previsou exam, outside providers and external EMR if available.   As well as notes that were available from care everywhere and other healthcare systems.  Past medical history, social, surgical and family history all reviewed in electronic medical record.  No pertanent information unless stated regarding to the chief complaint.   Past Medical History:  Diagnosis Date   Allergy    seasonal   Anemia    Arthritis    Cataract    forming   DJD of shoulder    Hyperlipidemia    Insomnia    Internal hemorrhoids    Tubular adenoma of colon    Vitamin D deficiency     Allergies  Allergen Reactions   Latex Other (See Comments)    Patient preference  Sensitivity    Penicillins     REACTION: Childhood reaction     Review of Systems:  No headache, visual changes, nausea, vomiting, diarrhea, constipation, dizziness, abdominal pain, skin rash, fevers, chills, night sweats, weight loss, swollen lymph nodes, body aches, joint swelling, chest pain, shortness of breath, mood changes. POSITIVE muscle aches  Objective  Blood pressure 120/78, pulse 64, height '5\' 5"'$  (1.651 m), weight 126 lb (57.2 kg), SpO2 98 %.   General: No apparent distress alert and oriented x3 mood and affect normal, dressed appropriately.  HEENT: Pupils equal, extraocular  movements intact  Respiratory: Patient's speak in full sentences and does not appear short of breath  Cardiovascular: No lower extremity edema, non tender, no erythema  Neck exam does have some loss of lordosis.  Low back does have some scoliosis noted.  Does have some tightness noted.  Atrophy of the right shoulder girdle noted from patient's underlying arthritis.  Osteopathic findings C6 flexed rotated and side bent right T3 extended rotated and side bent right inhaled rib T8 extended rotated and side bent right L2 flexed rotated and side bent right Sacrum right on right       Assessment and Plan:  Degenerative disc disease, lumbar Patient responded extremely well to osteopathic manipulations.  Discussed with patient to continue to stay.  With patient having more discomfort discussed certain things such as being careful with some of the lateral motions and some of her workout classes.  Patient is going to increase activity slowly otherwise.  Follow-up again in 6 to 8 weeks.    Nonallopathic problems  Decision today to treat with OMT was based on Physical Exam  After verbal consent patient was treated with HVLA, ME, FPR techniques in cervical, rib, thoracic, lumbar, and sacral  areas  Patient tolerated the procedure well with improvement in symptoms  Patient given exercises, stretches and lifestyle modifications  See medications in patient instructions if given  Patient will follow up in 4-8 weeks  Note: This dictation was prepared with Dragon dictation along with smaller phrase technology. Any transcriptional errors that result from this process are unintentional.

## 2022-04-06 ENCOUNTER — Encounter: Payer: Self-pay | Admitting: Family Medicine

## 2022-04-06 ENCOUNTER — Ambulatory Visit (INDEPENDENT_AMBULATORY_CARE_PROVIDER_SITE_OTHER): Payer: Medicare Other | Admitting: Family Medicine

## 2022-04-06 VITALS — BP 120/78 | HR 64 | Ht 65.0 in | Wt 126.0 lb

## 2022-04-06 DIAGNOSIS — M9902 Segmental and somatic dysfunction of thoracic region: Secondary | ICD-10-CM | POA: Diagnosis not present

## 2022-04-06 DIAGNOSIS — M9903 Segmental and somatic dysfunction of lumbar region: Secondary | ICD-10-CM | POA: Diagnosis not present

## 2022-04-06 DIAGNOSIS — M9908 Segmental and somatic dysfunction of rib cage: Secondary | ICD-10-CM

## 2022-04-06 DIAGNOSIS — M5136 Other intervertebral disc degeneration, lumbar region: Secondary | ICD-10-CM | POA: Diagnosis not present

## 2022-04-06 DIAGNOSIS — M9904 Segmental and somatic dysfunction of sacral region: Secondary | ICD-10-CM

## 2022-04-06 DIAGNOSIS — M9901 Segmental and somatic dysfunction of cervical region: Secondary | ICD-10-CM

## 2022-04-06 NOTE — Patient Instructions (Signed)
Good to see you.  Stay active [place] See me again in 6 weeks

## 2022-04-06 NOTE — Assessment & Plan Note (Signed)
Patient responded extremely well to osteopathic manipulations.  Discussed with patient to continue to stay.  With patient having more discomfort discussed certain things such as being careful with some of the lateral motions and some of her workout classes.  Patient is going to increase activity slowly otherwise.  Follow-up again in 6 to 8 weeks.

## 2022-05-09 IMAGING — MG MM DIGITAL SCREENING BILAT W/ TOMO AND CAD
6 of 10 series · 6 of 30 positions shown · non-contrast
Comparison: Previous exam(s).

CLINICAL DATA: Screening.

EXAM:
DIGITAL SCREENING BILATERAL MAMMOGRAM WITH TOMOSYNTHESIS AND CAD
TECHNIQUE: Bilateral screening digital craniocaudal and mediolateral oblique
mammograms were obtained. Bilateral screening digital breast
tomosynthesis was performed. The images were evaluated with
computer-aided detection.

[L MLO synth-2D]
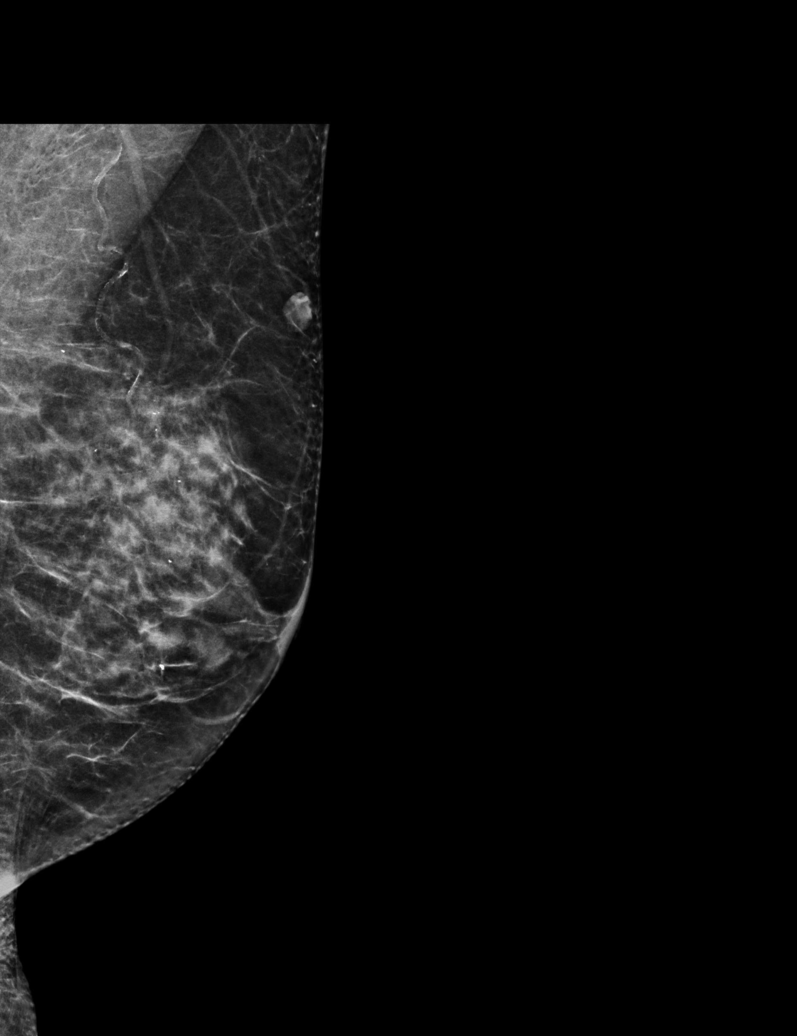

[R MLO synth-2D]
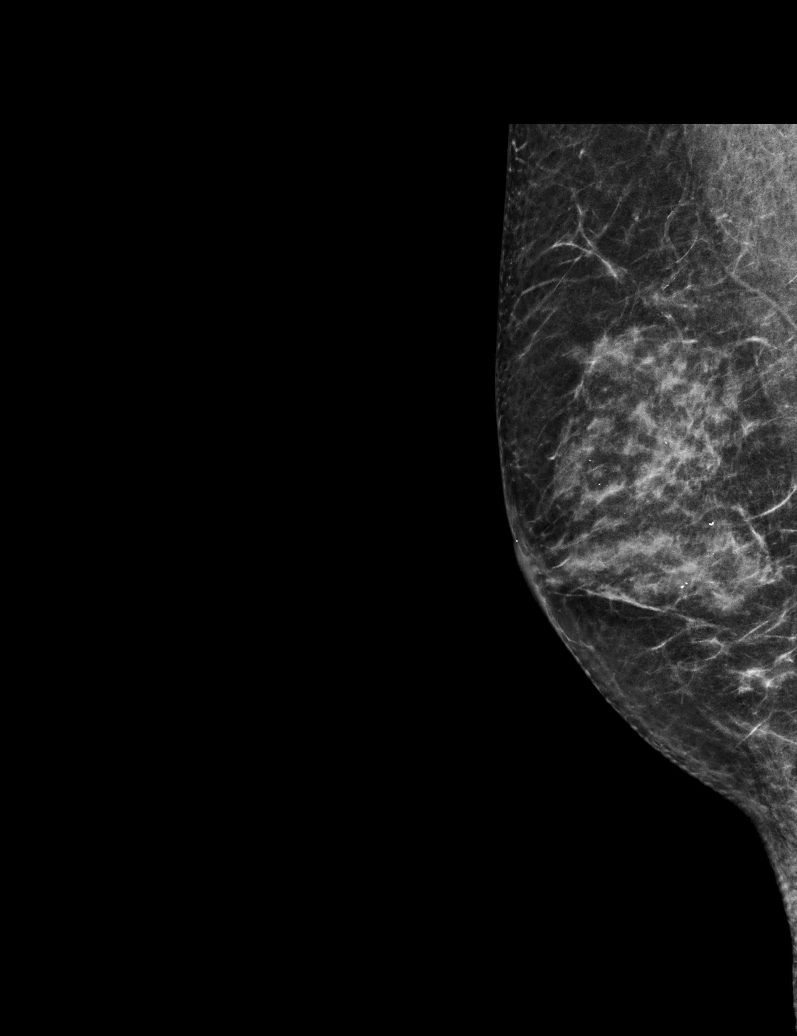

[L CC synth-2D]
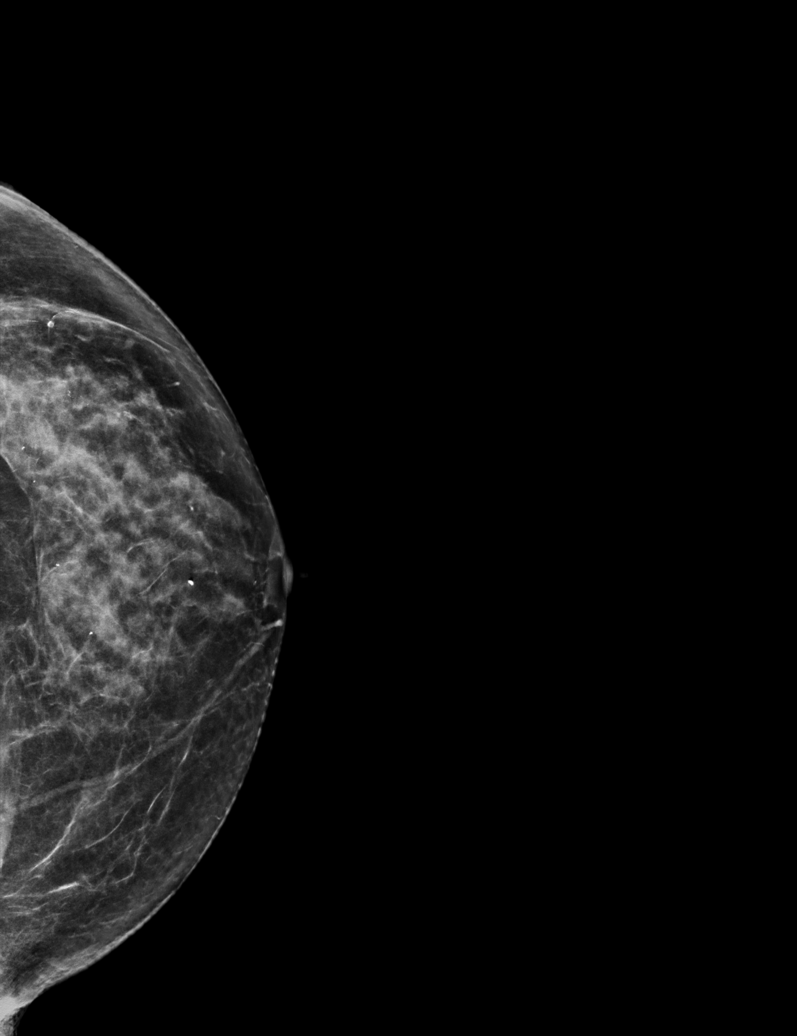

[R CC synth-2D (1 of 2)]
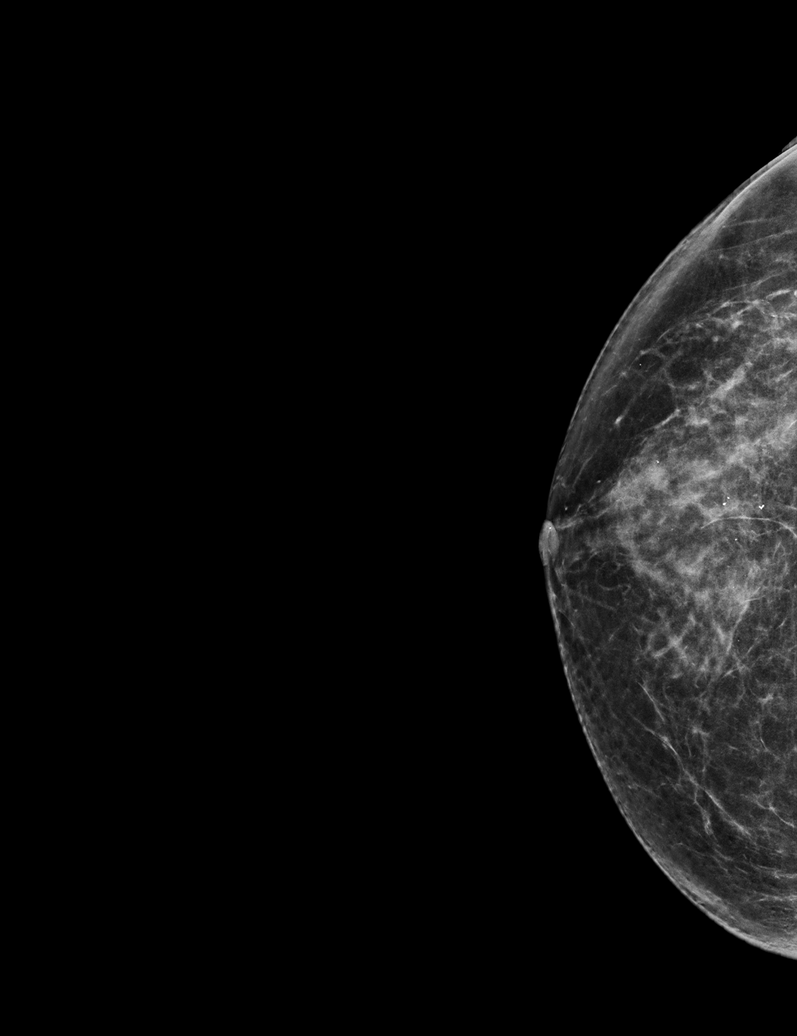

[R CC synth-2D (2 of 2)]
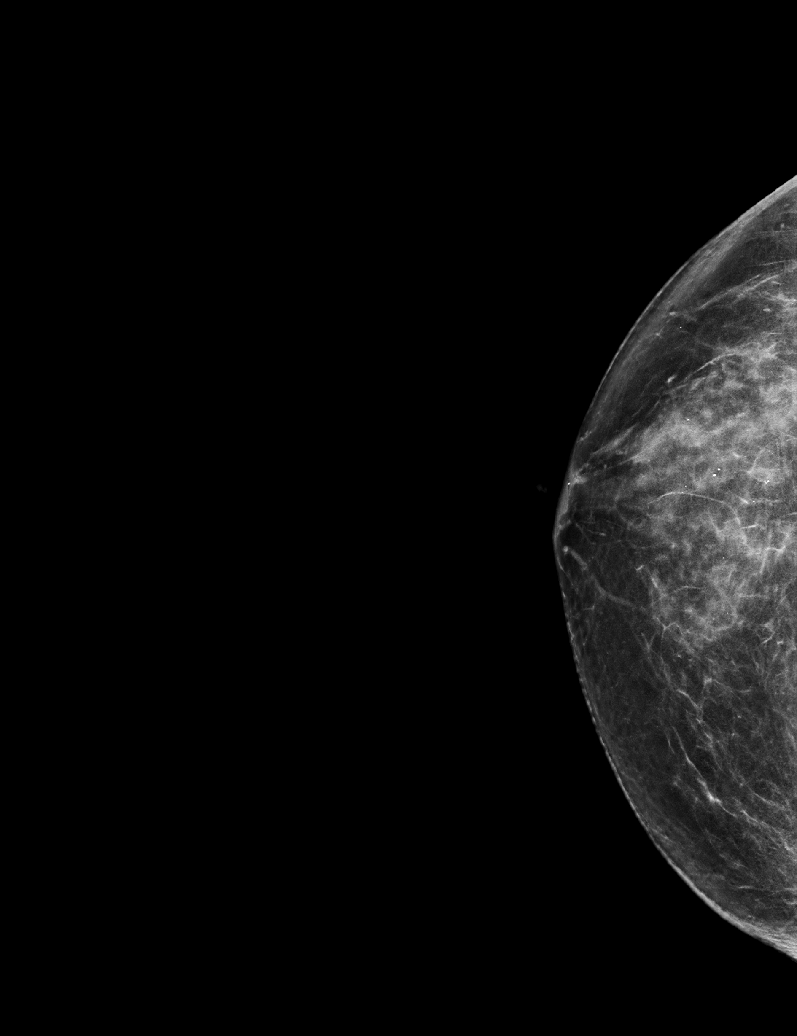

[L CC tomo · tomo slice 31/61.0]
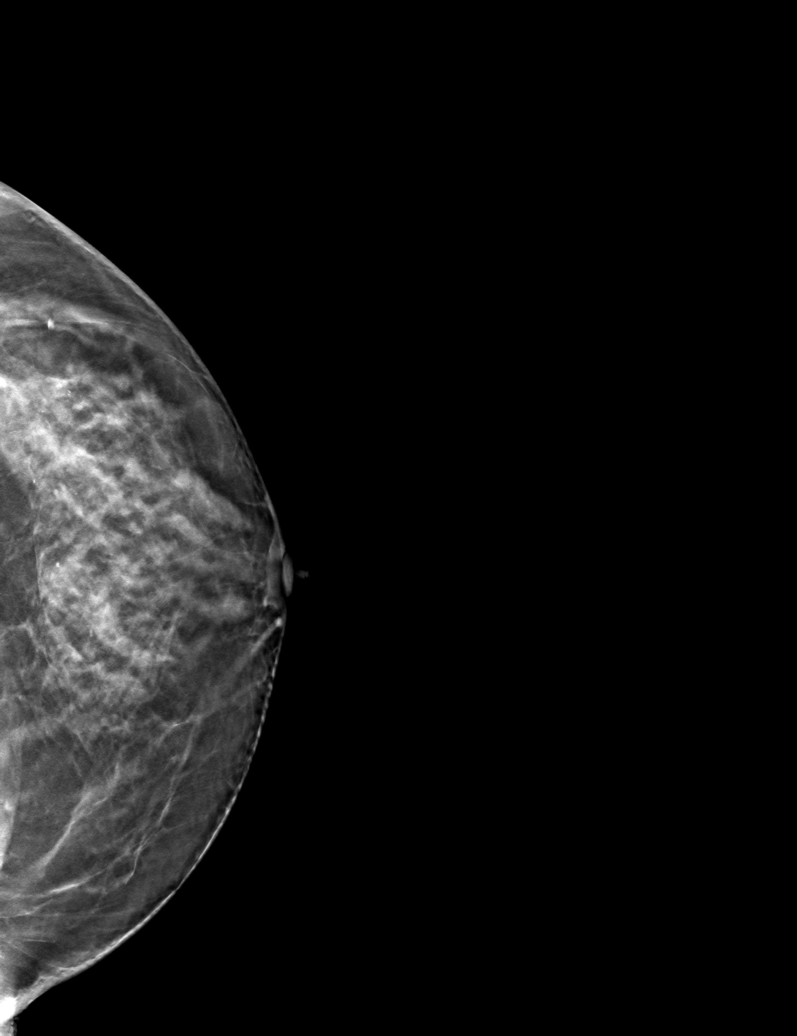

[6 of 30 positions shown; findings below may reference images not displayed]

ACR Breast Density Category c: The breast tissue is heterogeneously
dense, which may obscure small masses.
FINDINGS: There are no findings suspicious for malignancy.
IMPRESSION: No mammographic evidence of malignancy. A result letter of this
screening mammogram will be mailed directly to the patient.

RECOMMENDATION:
Screening mammogram in one year. (Code:Q3-W-BC3)

BI-RADS CATEGORY  1: Negative.

## 2022-05-10 ENCOUNTER — Other Ambulatory Visit: Payer: Self-pay | Admitting: Internal Medicine

## 2022-05-10 DIAGNOSIS — Z1231 Encounter for screening mammogram for malignant neoplasm of breast: Secondary | ICD-10-CM

## 2022-05-17 DIAGNOSIS — L821 Other seborrheic keratosis: Secondary | ICD-10-CM | POA: Diagnosis not present

## 2022-05-17 DIAGNOSIS — L812 Freckles: Secondary | ICD-10-CM | POA: Diagnosis not present

## 2022-05-17 DIAGNOSIS — D1801 Hemangioma of skin and subcutaneous tissue: Secondary | ICD-10-CM | POA: Diagnosis not present

## 2022-05-17 DIAGNOSIS — D2371 Other benign neoplasm of skin of right lower limb, including hip: Secondary | ICD-10-CM | POA: Diagnosis not present

## 2022-05-17 DIAGNOSIS — D2272 Melanocytic nevi of left lower limb, including hip: Secondary | ICD-10-CM | POA: Diagnosis not present

## 2022-05-17 NOTE — Progress Notes (Signed)
[place] [name] [address] [place] Phone: [phone] Subjective:   [name], am serving as a scribe for Dr. Hulan Saas.  I'm seeing this patient by the request  of:  [name], MD  CC: Low back and neck pain  [id number]  Jennifer [name] is a 77 y.o. female coming in with complaint of back and neck pain. OMT on 04/06/2022. Patient states doing well. Here for manipulation. Would like to ask question about her hammer toes.  Medications patient has been prescribed: None  Taking:         Reviewed prior external information including notes and imaging from previsou exam, outside providers and external EMR if available.   As well as notes that were available from care everywhere and other healthcare systems.  Past medical history, social, surgical and family history all reviewed in electronic medical record.  No pertanent information unless stated regarding to the chief complaint.   Past Medical History:  Diagnosis Date   Allergy    seasonal   Anemia    Arthritis    Cataract    forming   DJD of shoulder    Hyperlipidemia    Insomnia    Internal hemorrhoids    Tubular adenoma of colon    Vitamin D deficiency     Allergies  Allergen Reactions   Latex Other (See Comments)    Patient preference  Sensitivity    Penicillins     REACTION: Childhood reaction     Review of Systems:  No headache, visual changes, nausea, vomiting, diarrhea, constipation, dizziness, abdominal pain, skin rash, fevers, chills, night sweats, weight loss, swollen lymph nodes, body aches, joint swelling, chest pain, shortness of breath, mood changes. POSITIVE muscle aches  Objective  Blood pressure 118/66, pulse 62, height '5\' 5"'$  (1.651 m), weight 129 lb (58.5 kg), SpO2 98 %.   General: No apparent distress alert and oriented x3 mood and affect normal, dressed appropriately.  HEENT: Pupils equal, extraocular movements intact   Respiratory: Patient's speak in full sentences and does not appear short of breath  Cardiovascular: No lower extremity edema, non tender, no erythema  Significant arthritic changes of multiple joints.  Patient does have some tightness noted with Corky Sox arthritic changes noted of the right hand, lower back, as well as right shoulder.  Significant atrophy of the musculature.  Osteopathic findings  C3 flexed rotated and side bent right C7 flexed rotated and side bent left T3 extended rotated and side bent right inhaled rib T8 extended rotated and side bent left L2 flexed rotated and side bent right Sacrum right on right     Assessment and Plan:  Degenerative disc disease, lumbar Significant arthritic changes noted.  Patient continues to respond relatively well though to osteopathic manipulation.  The patient is going to continue to lift but continue to work with machine weights only at the moment.  Patient will continue to work on core strengthening.  Follow-up with me again in 6 to 8 weeks    Nonallopathic problems  Decision today to treat with OMT was based on Physical Exam  After verbal consent patient was treated with HVLA, ME, FPR techniques in cervical, rib, thoracic, lumbar, and sacral  areas  Patient tolerated the procedure well with improvement in symptoms  Patient given exercises, stretches and lifestyle modifications  See medications in patient instructions if given  Patient will follow up in 4-8 weeks    The above documentation has  been reviewed and is accurate and complete Jennifer Pulley, DO          Note: This dictation was prepared with Dragon dictation along with smaller phrase technology. Any transcriptional errors that result from this process are unintentional.

## 2022-05-18 ENCOUNTER — Encounter: Payer: Self-pay | Admitting: Family Medicine

## 2022-05-18 ENCOUNTER — Ambulatory Visit (INDEPENDENT_AMBULATORY_CARE_PROVIDER_SITE_OTHER): Payer: Medicare Other | Admitting: Family Medicine

## 2022-05-18 VITALS — BP 118/66 | HR 62 | Ht 65.0 in | Wt 129.0 lb

## 2022-05-18 DIAGNOSIS — M9903 Segmental and somatic dysfunction of lumbar region: Secondary | ICD-10-CM | POA: Diagnosis not present

## 2022-05-18 DIAGNOSIS — M9901 Segmental and somatic dysfunction of cervical region: Secondary | ICD-10-CM

## 2022-05-18 DIAGNOSIS — M5136 Other intervertebral disc degeneration, lumbar region: Secondary | ICD-10-CM

## 2022-05-18 DIAGNOSIS — M9902 Segmental and somatic dysfunction of thoracic region: Secondary | ICD-10-CM | POA: Diagnosis not present

## 2022-05-18 DIAGNOSIS — M9908 Segmental and somatic dysfunction of rib cage: Secondary | ICD-10-CM

## 2022-05-18 DIAGNOSIS — M9904 Segmental and somatic dysfunction of sacral region: Secondary | ICD-10-CM

## 2022-05-18 NOTE — Assessment & Plan Note (Signed)
Significant arthritic changes noted.  Patient continues to respond relatively well though to osteopathic manipulation.  The patient is going to continue to lift but continue to work with machine weights only at the moment.  Patient will continue to work on core strengthening.  Follow-up with me again in 6 to 8 weeks

## 2022-05-18 NOTE — Patient Instructions (Addendum)
Good to see you I'll find a nutritionist Keep doing what you are doing See me in 6-8 weeks

## 2022-05-27 NOTE — Progress Notes (Unsigned)
[facility]enn[facility] [name] [address] [place] Phone: [phone] Subjective:   Jennifer [name], am serving as a scribe for Dr. Hulan Saas.  I'm seeing this patient by the request  of:  [name], MD  CC: Low back pain follow-up  [id number]  Jennifer [name] is a 77 y.o. female coming in with complaint of back and neck pain. OMT 05/18/2022. Patient states recently tweaked back and right hip has been bothersome.  Patient states it was even bothering her at night.  Describes the pain as a dull, throbbing aching sensation with some mild intermittent radicular symptoms  Medications patient has been prescribed: None  Taking:         Reviewed prior external information including notes and imaging from previsou exam, outside providers and external EMR if available.   As well as notes that were available from care everywhere and other healthcare systems.  Past medical history, social, surgical and family history all reviewed in electronic medical record.  No pertanent information unless stated regarding to the chief complaint.   Past Medical History:  Diagnosis Date   Allergy    seasonal   Anemia    Arthritis    Cataract    forming   DJD of shoulder    Hyperlipidemia    Insomnia    Internal hemorrhoids    Tubular adenoma of colon    Vitamin D deficiency     Allergies  Allergen Reactions   Latex Other (See Comments)    Patient preference  Sensitivity    Penicillins     REACTION: Childhood reaction     Review of Systems:  No headache, visual changes, nausea, vomiting, diarrhea, constipation, dizziness, abdominal pain, skin rash, fevers, chills, night sweats, weight loss, swollen lymph nodes, body aches, joint swelling, chest pain, shortness of breath, mood changes. POSITIVE muscle aches  Objective  Blood pressure 106/64, pulse 72, height 5' 5"$  (1.651 m), weight 125 lb (56.7 kg), SpO2 98 %.   General: No apparent  distress alert and oriented x3 mood and affect normal, dressed appropriately.  HEENT: Pupils equal, extraocular movements intact  Respiratory: Patient's speak in full sentences and does not appear short of breath  Cardiovascular: No lower extremity edema, non tender, no erythema  Low back does have tenderness to palpation over the sacroiliac joint.  Positive Faber on the left.  Patient does have an antalgic gait noted.  Some underlying arthritic changes with scoliosis noted.  Osteopathic findings   T3 extended rotated and side bent right inhaled rib T9 extended rotated and side bent left L2 flexed rotated and side bent right L4 flexed rotated and side bent left Sacrum left arm left       Assessment and Plan:  Degenerative disc disease, lumbar Degenerative disc disease.  Discussed icing regimen and home exercises, icing regimen, core strengthening.  Patient given prednisone with her traveling in the near future.  Discussed which activities to do and which ones to avoid, increase activity slowly.  Follow-up again in 6 to 8 weeks otherwise chronic, exacerbation noted.    Nonallopathic problems  Decision today to treat with OMT was based on Physical Exam  After verbal consent patient was treated with HVLA, ME, FPR techniques in  thoracic, lumbar, and sacral  areas  Patient tolerated the procedure well with improvement in symptoms  Patient given exercises, stretches and lifestyle modifications  See medications in patient instructions if given  Patient will follow up in  4-8 weeks    The above documentation has been reviewed and is accurate and complete [name], DO          Note: This dictation was prepared with Dragon dictation along with smaller phrase technology. Any transcriptional errors that result from this process are unintentional.

## 2022-05-31 ENCOUNTER — Ambulatory Visit (INDEPENDENT_AMBULATORY_CARE_PROVIDER_SITE_OTHER): Payer: Medicare Other | Admitting: Family Medicine

## 2022-05-31 VITALS — BP 106/64 | HR 72 | Ht 65.0 in | Wt 125.0 lb

## 2022-05-31 DIAGNOSIS — M9903 Segmental and somatic dysfunction of lumbar region: Secondary | ICD-10-CM

## 2022-05-31 DIAGNOSIS — M5136 Other intervertebral disc degeneration, lumbar region: Secondary | ICD-10-CM | POA: Diagnosis not present

## 2022-05-31 DIAGNOSIS — M9902 Segmental and somatic dysfunction of thoracic region: Secondary | ICD-10-CM

## 2022-05-31 DIAGNOSIS — M9904 Segmental and somatic dysfunction of sacral region: Secondary | ICD-10-CM | POA: Diagnosis not present

## 2022-05-31 MED ORDER — PREDNISONE 20 MG PO TABS: 40.0000 mg | ORAL_TABLET | Freq: Every day | ORAL | 0 refills | Status: DC

## 2022-05-31 NOTE — Assessment & Plan Note (Signed)
Degenerative disc disease.  Discussed icing regimen and home exercises, icing regimen, core strengthening.  Patient given prednisone with her traveling in the near future.  Discussed which activities to do and which ones to avoid, increase activity slowly.  Follow-up again in 6 to 8 weeks otherwise chronic, exacerbation noted.

## 2022-05-31 NOTE — Patient Instructions (Signed)
Prednisone 9m for 5 days Think I got it in place We want to see pictures See you at next appointment

## 2022-06-02 ENCOUNTER — Ambulatory Visit
Admission: RE | Admit: 2022-06-02 | Discharge: 2022-06-02 | Disposition: A | Discharge status: Home / Self Care | Payer: Medicare Other | Source: Ambulatory Visit | Attending: Internal Medicine | Admitting: Internal Medicine

## 2022-06-02 DIAGNOSIS — M810 Age-related osteoporosis without current pathological fracture: Secondary | ICD-10-CM | POA: Diagnosis not present

## 2022-06-02 DIAGNOSIS — Z780 Asymptomatic menopausal state: Secondary | ICD-10-CM | POA: Diagnosis not present

## 2022-06-04 ENCOUNTER — Encounter: Payer: Self-pay | Admitting: Internal Medicine

## 2022-06-28 ENCOUNTER — Ambulatory Visit: Payer: Medicare Other

## 2022-06-30 ENCOUNTER — Ambulatory Visit
Admission: RE | Admit: 2022-06-30 | Discharge: 2022-06-30 | Disposition: A | Discharge status: Home / Self Care | Payer: Medicare Other | Source: Ambulatory Visit | Attending: Internal Medicine | Admitting: Internal Medicine

## 2022-06-30 DIAGNOSIS — Z1231 Encounter for screening mammogram for malignant neoplasm of breast: Secondary | ICD-10-CM | POA: Diagnosis not present

## 2022-07-05 NOTE — Progress Notes (Unsigned)
Jennifer [name] [place] [address] [place] Phone: [phone] Subjective:   Jennifer [name], am serving as a scribe for Dr. Hulan Saas.  I'm seeing this patient by the request  of:  Jennifer Rail, MD  CC: Back and neck pain follow-up  [id number]  Jennifer [name] is a 77 y.o. female coming in with complaint of back and neck pain. OMT on 05/31/2022. Patient states doing well. Same per usual.  Patient still not starting any medication for her osteoporosis.  Medications patient has been prescribed: prednisone  Taking:         Reviewed prior external information including notes and imaging from previsou exam, outside providers and external EMR if available.   As well as notes that were available from care everywhere and other healthcare systems.  Past medical history, social, surgical and family history all reviewed in electronic medical record.  No pertanent information unless stated regarding to the chief complaint.   Past Medical History:  Diagnosis Date   Allergy    seasonal   Anemia    Arthritis    Cataract    forming   DJD of shoulder    Hyperlipidemia    Insomnia    Internal hemorrhoids    Tubular adenoma of colon    Vitamin D deficiency     Allergies  Allergen Reactions   Latex Other (See Comments)    Patient preference  Sensitivity    Penicillins     REACTION: Childhood reaction     Review of Systems:  No headache, visual changes, nausea, vomiting, diarrhea, constipation, dizziness, abdominal pain, skin rash, fevers, chills, night sweats, weight loss, swollen lymph nodes, body aches, joint swelling, chest pain, shortness of breath, mood changes. POSITIVE muscle aches  Objective  Blood pressure 128/70, pulse 73, height 5\' 5"  (1.651 m), weight 127 lb (57.6 kg), SpO2 95 %.   General: No apparent distress alert and oriented x3 mood and affect normal, dressed appropriately.  HEENT: Pupils equal,  extraocular movements intact  Respiratory: Patient's speak in full sentences and does not appear short of breath  Cardiovascular: No lower extremity edema, non tender, no erythema  Patient does have tenderness to palpation in the right scapular area. Arthritic changes of the right scapular noted.  No crepitus noted.  Neck does have more tightness on the right than the left.   Osteopathic findings  C3 flexed rotated and side bent right C5 flexed rotated and side bent right T3 extended rotated and side bent right inhaled rib T9 extended rotated and side bent left L2 flexed rotated and side bent right Sacrum right on right       Assessment and Plan:  Degenerative disc disease, lumbar Degenerative disc disease.  Discussed icing regimen and home exercises, which activities to do and which ones to avoid, patient we did discuss with her at great length.  Discussed avoiding certain activities.  Follow-up with me again in 6 to 8 weeks    Nonallopathic problems  Decision today to treat with OMT was based on Physical Exam  After verbal consent patient was treated with , ME, FPR techniques in cervical, rib, thoracic, lumbar, and sacral  areas  Patient tolerated the procedure well with improvement in symptoms  Patient given exercises, stretches and lifestyle modifications  See medications in patient instructions if given  Patient will follow up in 4-8 weeks    The above documentation has been reviewed and is accurate and complete Alroy Dust  Koren Bound, DO          Note: This dictation was prepared with Dragon dictation along with smaller phrase technology. Any transcriptional errors that result from this process are unintentional.

## 2022-07-06 ENCOUNTER — Ambulatory Visit (INDEPENDENT_AMBULATORY_CARE_PROVIDER_SITE_OTHER): Payer: Medicare Other | Admitting: Family Medicine

## 2022-07-06 VITALS — BP 128/70 | HR 73 | Ht 65.0 in | Wt 127.0 lb

## 2022-07-06 DIAGNOSIS — M9908 Segmental and somatic dysfunction of rib cage: Secondary | ICD-10-CM

## 2022-07-06 DIAGNOSIS — M9904 Segmental and somatic dysfunction of sacral region: Secondary | ICD-10-CM

## 2022-07-06 DIAGNOSIS — M9903 Segmental and somatic dysfunction of lumbar region: Secondary | ICD-10-CM

## 2022-07-06 DIAGNOSIS — M9901 Segmental and somatic dysfunction of cervical region: Secondary | ICD-10-CM

## 2022-07-06 DIAGNOSIS — M9902 Segmental and somatic dysfunction of thoracic region: Secondary | ICD-10-CM | POA: Diagnosis not present

## 2022-07-06 DIAGNOSIS — M5136 Other intervertebral disc degeneration, lumbar region: Secondary | ICD-10-CM

## 2022-07-06 NOTE — Assessment & Plan Note (Signed)
Degenerative disc disease.  Discussed icing regimen and home exercises, which activities to do and which ones to avoid, patient we did discuss with her at great length.  Discussed avoiding certain activities.  Follow-up with me again in 6 to 8 weeks

## 2022-07-06 NOTE — Patient Instructions (Signed)
Good to see you! See you again in May  Have great time in [place]

## 2022-08-25 NOTE — Progress Notes (Signed)
[name] [address] [place] [address] Phone: [phone] Subjective:   [name], am serving as a scribe for Dr. Antoine Primas.  I'm seeing this patient by the request  of:  Pincus Sanes, MD  CC: back and neck pain follow up   [id number]  Jennifer [name] is a 77 y.o. female coming in with complaint of back and neck pain. OMT on 07/06/2022. Patient states same per usual. No new concerns.  Patient does have tightness noted in the paraspinal musculature.  Patient states nothing that is stopping her from activity at this moment.  Medications patient has been prescribed:   Taking:         Reviewed prior external information including notes and imaging from previsou exam, outside providers and external EMR if available.   As well as notes that were available from care everywhere and other healthcare systems.  Past medical history, social, surgical and family history all reviewed in electronic medical record.  No pertanent information unless stated regarding to the chief complaint.   Past Medical History:  Diagnosis Date   Allergy    seasonal   Anemia    Arthritis    Cataract    forming   DJD of shoulder    Hyperlipidemia    Insomnia    Internal hemorrhoids    Tubular adenoma of colon    Vitamin D deficiency     Allergies  Allergen Reactions   Latex Other (See Comments)    Patient preference  Sensitivity    Penicillins     REACTION: Childhood reaction     Review of Systems:  No headache, visual changes, nausea, vomiting, diarrhea, constipation, dizziness, abdominal pain, skin rash, fevers, chills, night sweats, weight loss, swollen lymph nodes, body aches, joint swelling, chest pain, shortness of breath, mood changes. POSITIVE muscle aches  Objective  Blood pressure 128/80, pulse 71, height 5\' 5"  (1.651 m), weight 128 lb (58.1 kg), SpO2 98 %.   General: No apparent distress alert and oriented x3 mood  and affect normal, dressed appropriately.  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  Some tenderness to palpation in the paraspinal musculature.  More over the left scapular area.  Patient does have some tightness noted with Pearlean Brownie right greater than left. Significant arthritic changes of the shoulder girdle.  Osteopathic findings  C3 flexed rotated and side bent right C7 flexed rotated and side bent left T3 extended rotated and side bent left inhaled rib T7 extended rotated and side bent left inhaled. L2 flexed rotated and side bent right Sacrum right on right    Assessment and Plan:  Degenerative disc disease, lumbar Patient does have arthritic changes noted.  Discussed osteoporosis also noted.  I avoided any HVLA and did more of the muscle energy today.  We discussed patient's osteoporosis as well.  Patient has responded extremely well to osteopathic manipulation otherwise.  Discussed icing regimen.  Follow-up again in 6 to 8 weeks  Osteoporosis Discussed with patient about Prolia.  Patient will consider it    Nonallopathic problems  Decision today to treat with OMT was based on Physical Exam  After verbal consent patient was treated with HVLA, ME, FPR techniques in cervical, rib, thoracic, lumbar, and sacral  areas  Patient tolerated the procedure  well with improvement in symptoms  Patient given exercises, stretches and lifestyle modifications  See medications in patient instructions if given  Patient will follow up in 4-8 weeks     The above documentation has been reviewed and is accurate and complete Judi Saa, DO         Note: This dictation was prepared with Dragon dictation along with smaller phrase technology. Any transcriptional errors that  result from this process are unintentional.

## 2022-08-30 ENCOUNTER — Encounter: Payer: Self-pay | Admitting: Family Medicine

## 2022-08-30 ENCOUNTER — Telehealth: Payer: Self-pay

## 2022-08-30 ENCOUNTER — Ambulatory Visit (INDEPENDENT_AMBULATORY_CARE_PROVIDER_SITE_OTHER): Payer: Medicare Other | Admitting: Family Medicine

## 2022-08-30 VITALS — BP 128/80 | HR 71 | Ht 65.0 in | Wt 128.0 lb

## 2022-08-30 DIAGNOSIS — M9901 Segmental and somatic dysfunction of cervical region: Secondary | ICD-10-CM | POA: Diagnosis not present

## 2022-08-30 DIAGNOSIS — M810 Age-related osteoporosis without current pathological fracture: Secondary | ICD-10-CM

## 2022-08-30 DIAGNOSIS — M5136 Other intervertebral disc degeneration, lumbar region: Secondary | ICD-10-CM

## 2022-08-30 DIAGNOSIS — M9908 Segmental and somatic dysfunction of rib cage: Secondary | ICD-10-CM

## 2022-08-30 DIAGNOSIS — M9903 Segmental and somatic dysfunction of lumbar region: Secondary | ICD-10-CM | POA: Diagnosis not present

## 2022-08-30 DIAGNOSIS — M9904 Segmental and somatic dysfunction of sacral region: Secondary | ICD-10-CM

## 2022-08-30 DIAGNOSIS — M9902 Segmental and somatic dysfunction of thoracic region: Secondary | ICD-10-CM | POA: Diagnosis not present

## 2022-08-30 NOTE — Assessment & Plan Note (Signed)
Discussed with patient about Prolia.  Patient will consider it

## 2022-08-30 NOTE — Telephone Encounter (Signed)
Pt has called and stated she would like to go ahead and start on Prolia.  ** Pt is needing the coverage process started for her prolia.

## 2022-08-30 NOTE — Telephone Encounter (Signed)
Prolia VOB initiated via [email or web address] 

## 2022-08-30 NOTE — Patient Instructions (Signed)
Sorry to disappoint you Good luck to the gunners  See you again in 6-8 weeks

## 2022-08-30 NOTE — Assessment & Plan Note (Signed)
Patient does have arthritic changes noted.  Discussed osteoporosis also noted.  I avoided any HVLA and did more of the muscle energy today.  We discussed patient's osteoporosis as well.  Patient has responded extremely well to osteopathic manipulation otherwise.  Discussed icing regimen.  Follow-up again in 6 to 8 weeks

## 2022-09-02 ENCOUNTER — Other Ambulatory Visit (HOSPITAL_COMMUNITY): Payer: Self-pay

## 2022-09-02 NOTE — Telephone Encounter (Signed)
Pt ready for scheduling for Prolia on or after : 09/02/22  Out-of-pocket cost due at time of visit: $0  Primary: [place] [employer] Prolia co-insurance: 20% Admin fee co-insurance: 20%  Secondary: [employer] Prolia co-insurance: 100% Admin fee co-insurance: 100%  Medical Benefit Details: Date Benefits were checked: 08/31/22 Deductible: $240 ($240 met)/ Coinsurance: 100%/ Admin Fee: 100%  Prior Auth: not required PA# Expiration Date:    Pharmacy benefit: Copay $-- If patient wants fill through the pharmacy benefit please send prescription to:  -- , and include estimated need by date in rx notes. Pharmacy will ship medication directly to the office.  Patient NOT eligible for Prolia Copay Card. Copay Card can make patient's cost as little as $25. Link to apply: [email or web address]://[email or web address]  ** This summary of benefits is an estimation of the patient's out-of-pocket cost. Exact cost may very based on individual plan coverage.

## 2022-09-05 NOTE — Telephone Encounter (Signed)
Spoke with patient and appointment made

## 2022-09-07 ENCOUNTER — Ambulatory Visit (INDEPENDENT_AMBULATORY_CARE_PROVIDER_SITE_OTHER): Payer: Medicare Other

## 2022-09-07 DIAGNOSIS — M810 Age-related osteoporosis without current pathological fracture: Secondary | ICD-10-CM | POA: Diagnosis not present

## 2022-09-07 MED ORDER — DENOSUMAB 60 MG/ML ~~LOC~~ SOSY
60.0000 mg | PREFILLED_SYRINGE | Freq: Once | SUBCUTANEOUS | Status: AC
  Administered 2022-09-07: 60 mg via SUBCUTANEOUS

## 2022-09-07 NOTE — Progress Notes (Signed)
Pt was given prolia injec without any complications.

## 2022-09-19 ENCOUNTER — Ambulatory Visit (INDEPENDENT_AMBULATORY_CARE_PROVIDER_SITE_OTHER): Payer: Medicare Other

## 2022-09-19 DIAGNOSIS — Z0000 Encounter for general adult medical examination without abnormal findings: Secondary | ICD-10-CM | POA: Diagnosis not present

## 2022-09-19 NOTE — Progress Notes (Signed)
Subjective:   Jennifer [name] is a 77 y.o. female who presents for Medicare Annual (Subsequent) preventive examination.  I connected with Jennifer [name] today by telephone and verified that I am speaking with the correct person using two identifiers. I discussed the limitations, risks, security and privacy concerns of performing an evaluation and management service by telephone and the availability of in person appointments. I also discussed with the patient that there may be a patient responsible charge related to this service. The patient expressed understanding and agreed to proceed. Location patient: home Location provider: Kyra [name] Persons participating in the visit: Jennifer [name] and Jennifer [name], Jennifer [name].  Time Spent with patient on telephone encounter: 10 mins  Review of Systems    No ROS. Medicare Wellness Telephone Visit. Additional risk factors are reflected in social history. Cardiac Risk Factors include: advanced age (>81men, >48 women);dyslipidemia;family history of premature cardiovascular disease     Objective:    There were no vitals filed for this visit. There is no height or weight on file to calculate BMI.     09/19/2022    2:06 PM 08/30/2021    1:05 PM  Advanced Directives  Does Patient Have a Medical Advance Directive? Yes Yes  Type of Estate agent of [place];Living will Living will;Healthcare Power of Attorney  Does patient want to make changes to medical advance directive? No - Patient declined No - Patient declined  Copy of Healthcare Power of Attorney in Chart? No - copy requested Yes - validated most recent copy scanned in chart (See row information)    Current Medications (verified) Outpatient Encounter Medications as of 09/19/2022  Medication Sig   b complex vitamins tablet Take 1 tablet by mouth 2 (two) times a week.    Calcium Carb-Cholecalciferol (CALCIUM 500 + D3 PO) Take by mouth.   cholecalciferol (VITAMIN D3) 25 MCG (1000  UNIT) tablet    magnesium oxide (MAG-OX) 400 MG tablet Take 400 mg by mouth daily.   omega-3 acid ethyl esters (LOVAZA) 1 G capsule Take 1 g by mouth 2 (two) times a week.    rosuvastatin (CRESTOR) 5 MG tablet Take 1 tablet (5 mg total) by mouth 3 (three) times a week.   [DISCONTINUED] cholecalciferol (VITAMIN D) 1000 units tablet Take 5,000 Units by mouth daily.   [DISCONTINUED] predniSONE (DELTASONE) 20 MG tablet Take 2 tablets (40 mg total) by mouth daily with breakfast.   No facility-administered encounter medications on file as of 09/19/2022.    Allergies (verified) Latex and Penicillins   History: Past Medical History:  Diagnosis Date   Allergy    seasonal   Anemia    Arthritis    Cataract    forming   DJD of shoulder    Hyperlipidemia    Insomnia    Internal hemorrhoids    Tubular adenoma of colon    Vitamin D deficiency    Past Surgical History:  Procedure Laterality Date   COLONOSCOPY     FOOT SURGERY Left    FOOT SURGERY Right    HIATAL HERNIA REPAIR  04/18/1952   age 56   OOPHORECTOMY     POLYPECTOMY     SHOULDER ARTHROSCOPY Right    TOTAL ABDOMINAL HYSTERECTOMY W/ BILATERAL SALPINGOOPHORECTOMY  04/18/2005   WISDOM TOOTH EXTRACTION     as teenager   Family History  Problem Relation Age of Onset   Hypertension Mother    Breast cancer Mother    Heart disease Mother  Heart disease Father    Heart attack Father 100       deceased   Prostate cancer Maternal Uncle    Colon cancer Neg Hx    Colon polyps Neg Hx    Esophageal cancer Neg Hx    Rectal cancer Neg Hx    Stomach cancer Neg Hx    Social History   Socioeconomic History   Marital status: Married    Spouse name: Not on file   Number of children: 2   Years of education: Not on file   Highest education level: Not on file  Occupational History   Not on file  Tobacco Use   Smoking status: Never   Smokeless tobacco: Never  Substance and Sexual Activity   Alcohol use: Yes    Alcohol/week: 0.0  standard drinks of alcohol    Comment: 3 per week   Drug use: No   Sexual activity: Yes  Other Topics Concern   Not on file  Social History Narrative   Not on file   Social Determinants of Health   Financial Resource Strain: Low Risk  (09/19/2022)   Overall Financial Resource Strain (CARDIA)    Difficulty of Paying Living Expenses: Not hard at all  Food Insecurity: No Food Insecurity (09/19/2022)   Hunger Vital Sign    Worried About Running Out of Food in the Last Year: Never true    Ran Out of Food in the Last Year: Never true  Transportation Needs: No Transportation Needs (09/19/2022)   PRAPARE - Administrator, Civil Service (Medical): No    Lack of Transportation (Non-Medical): No  Physical Activity: Sufficiently Active (09/19/2022)   Exercise Vital Sign    Days of Exercise per Week: 5 days    Minutes of Exercise per Session: 60 min  Stress: No Stress Concern Present (09/19/2022)   Jennifer [name] of Occupational Health - Occupational Stress Questionnaire    Feeling of Stress : Only a little  Social Connections: Socially Integrated (09/19/2022)   Social Connection and Isolation Panel [NHANES]    Frequency of Communication with Friends and Family: More than three times a week    Frequency of Social Gatherings with Friends and Family: More than three times a week    Attends Religious Services: More than 4 times per year    Active Member of [employer] or Organizations: Yes    Attends Engineer, structural: More than 4 times per year    Marital Status: Married    Tobacco Counseling Counseling given: Not Answered   Clinical Intake:  Pre-visit preparation completed: Yes  Pain : No/denies pain     BMI - recorded: 20.97 Nutritional Status: BMI of 19-24  Normal Nutritional Risks: None Diabetes: No  How often do you need to have someone help you when you read instructions, pamphlets, or other written materials from your doctor or pharmacy?: 1 - Never What is the  last grade level you completed in school?: bachelors degree  Interpreter Needed?: No  Information entered by :: Jennifer [name], Jennifer [name]   Activities of Daily Living    09/19/2022    2:06 PM  In your present state of health, do you have any difficulty performing the following activities:  Hearing? 0  Vision? 0  Difficulty concentrating or making decisions? 0  Walking or climbing stairs? 0  Dressing or bathing? 0  Doing errands, shopping? 0  Preparing Food and eating ? N  Using the Toilet? N  In the past  six months, have you accidently leaked urine? N  Do you have problems with loss of bowel control? N  Managing your Medications? N  Managing your Finances? N  Housekeeping or managing your Housekeeping? N    Patient Care Team: Pincus Sanes, MD as PCP - General (Internal Medicine) Antony Contras, MD as Consulting Physician (Ophthalmology) Richardean Sale, DO as Consulting Physician (Sports Medicine)  Indicate any recent Medical Services you may have received from other than [facility] providers in the past year (date may be approximate).     Assessment:   This is a routine wellness examination for Jennifer [name].  Hearing/Vision screen Patient has hearing difficulty and wears hearing aids. Patient does wear corrective lenses.  Dietary issues and exercise activities discussed: Current Exercise Habits: Structured exercise class;Home exercise routine, Type of exercise: walking;yoga;Other - see comments (swim), Time (Minutes): 60, Intensity: Mild, Exercise limited by: None identified   Goals Addressed             This Visit's Progress    Patient Stated       I would like to continue my current lifestyle       Depression Screen    09/19/2022    2:05 PM 03/04/2022    1:16 PM 08/30/2021    1:08 PM 03/02/2021    9:25 AM 05/24/2013    2:21 PM 05/01/2012   11:33 AM  PHQ 2/9 Scores  PHQ - 2 Score 0 0 0 0 0 0    Fall Risk    09/19/2022    2:06 PM 03/04/2022    1:16 PM 08/30/2021    1:06  PM 03/02/2021    9:24 AM 05/24/2013    2:21 PM  Fall Risk   Falls in the past year? 0 0 0 0 No  Number falls in past yr: 0 0 0 0   Injury with Fall? 0 0 0 0   Risk for fall due to : No Fall Risks No Fall Risks No Fall Risks    Follow up Falls evaluation completed Falls evaluation completed Falls evaluation completed      FALL RISK PREVENTION PERTAINING TO THE HOME:  Any stairs in or around the home? No  If so, are there any without handrails? No  Home free of loose throw rugs in walkways, pet beds, electrical cords, etc? Yes  Adequate lighting in your home to reduce risk of falls? Yes   ASSISTIVE DEVICES UTILIZED TO PREVENT FALLS:  Life alert? No  Use of a cane, walker or w/c? No  Grab bars in the bathroom? Yes  Shower chair or bench in shower? Yes  Elevated toilet seat or a handicapped toilet? Yes   TIMED UP AND GO:  Was the test performed? No .  Length of time to ambulate 10 feet: NA sec.   Patient stated that she has no issues with gait or balance; does not use any assistive devices.  Cognitive Function:  Patient is cogitatively intact.      09/19/2022    2:07 PM 08/30/2021    1:14 PM  6CIT Screen  What Year? 0 points 0 points  What month? 0 points 0 points  What time? 0 points 0 points  Count back from 20 0 points 0 points  Months in reverse 0 points 0 points  Repeat phrase 0 points 0 points  Total Score 0 points 0 points    Immunizations Immunization History  Administered Date(s) Administered   Influenza Split 01/16/2017, 02/09/2018, 02/11/2019  Influenza, High Dose Seasonal PF 02/16/2017   Influenza-Unspecified 01/19/2020, 01/03/2021, 02/05/2022   Moderna Sars-Covid-2 Vaccination 02/19/2020, 12/24/2020   PFIZER(Purple Top)SARS-COV-2 Vaccination 05/24/2019, 06/18/2019   Pfizer Covid-19 Vaccine Bivalent Booster 61yrs & up 01/26/2022   Pneumococcal Conjugate-13 06/11/2014   Pneumococcal Polysaccharide-23 09/07/2012, 08/09/2016   Td 03/05/2009   Tdap  09/18/2017   Zoster Recombinat (Shingrix) 06/13/2017, 09/18/2017   Zoster, Live 06/11/2012, 12/25/2012, 09/18/2017    TDAP status: Up to date  Flu Vaccine status: Up to date  Pneumococcal vaccine status: Up to date  Covid-19 vaccine status: Completed vaccines  Qualifies for Shingles Vaccine? Yes   Zostavax completed No   Shingrix Completed?: Yes  Screening Tests Health Maintenance  Topic Date Due   COVID-19 Vaccine (6 - 2023-24 season) 10/05/2022 (Originally 03/23/2022)   INFLUENZA VACCINE  11/17/2022   Medicare Annual Wellness (AWV)  09/19/2023   DEXA SCAN  06/02/2024   DTaP/Tdap/Td (3 - Td or Tdap) 09/19/2027   Pneumonia Vaccine 6+ Years old  Completed   Hepatitis C Screening  Completed   Zoster Vaccines- Shingrix  Completed   HPV VACCINES  Aged Out   Colonoscopy  Discontinued    Health Maintenance  There are no preventive care reminders to display for this patient.   Colorectal cancer screening: Type of screening: Colonoscopy. Completed 04/05/2021. Repeat every N/A years  Mammogram status: No longer required due to age.  Bone Density status: Completed 06/02/22. Results reflect: Bone density results: OSTEOPOROSIS. Repeat every 2 years.  Lung Cancer Screening: (Low Dose CT Chest recommended if Age 49-80 years, 30 pack-year currently smoking OR have quit w/in 15years.) does not qualify.   Lung Cancer Screening Referral: NA  Additional Screening:  Hepatitis C Screening: does qualify; Completed 04/06/2007  Vision Screening: Recommended annual ophthalmology exams for early detection of glaucoma and other disorders of the eye. Is the patient up to date with their annual eye exam?  Yes  Who is the provider or what is the name of the office in which the patient attends annual eye exams? Dr. Randon Goldsmith If pt is not established with a provider, would they like to be referred to a provider to establish care? No .   Dental Screening: Recommended annual dental exams for proper  oral hygiene  Community Resource Referral / Chronic Care Management: CRR required this visit?  No   CCM required this visit?  No      Plan:     I have personally reviewed and noted the following in the patient's chart:   Medical and social history Use of alcohol, tobacco or illicit drugs  Current medications and supplements including opioid prescriptions. Patient is not currently taking opioid prescriptions. Functional ability and status Nutritional status Physical activity Advanced directives List of other physicians Hospitalizations, surgeries, and ER visits in previous 12 months Vitals Screenings to include cognitive, depression, and falls Referrals and appointments  In addition, I have reviewed and discussed with patient certain preventive protocols, quality metrics, and best practice recommendations. A written personalized care plan for preventive services as well as general preventive health recommendations were provided to patient.     Marinus Maw, Jennifer [name]   09/19/2022   Nurse Notes: N/A

## 2022-09-21 DIAGNOSIS — H25813 Combined forms of age-related cataract, bilateral: Secondary | ICD-10-CM | POA: Diagnosis not present

## 2022-09-21 DIAGNOSIS — H524 Presbyopia: Secondary | ICD-10-CM | POA: Diagnosis not present

## 2022-10-14 NOTE — Progress Notes (Unsigned)
Jennifer [name] [address] [place] [address] Phone: [phone] Subjective:   Jennifer [name], am serving as a scribe for Dr. Antoine Primas.  I'm seeing this patient by the request  of:  Jennifer Sanes, MD  CC: Neck and back pain follow-up  [id number]  Jennifer [name] is a 77 y.o. female coming in with complaint of back and neck pain. OMT on 08/30/2022. Patient states same per usual. No new concerns.  Has had been having tightness but nothing severe.  Continues to workout most days of the week at the gym.  Some discomfort but nothing once again waking her up at night or stopping her from daily activities.          Reviewed prior external information including notes and imaging from previsou exam, outside providers and external EMR if available.   As well as notes that were available from care everywhere and other healthcare systems.  Past medical history, social, surgical and family history all reviewed in electronic medical record.  No pertanent information unless stated regarding to the chief complaint.   Past Medical History:  Diagnosis Date   Allergy    seasonal   Anemia    Arthritis    Cataract    forming   DJD of shoulder    Hyperlipidemia    Insomnia    Internal hemorrhoids    Tubular adenoma of colon    Vitamin D deficiency     Allergies  Allergen Reactions   Latex Other (See Comments)    Patient preference  Sensitivity    Penicillins     REACTION: Childhood reaction     Review of Systems:  No headache, visual changes, nausea, vomiting, diarrhea, constipation, dizziness, abdominal pain, skin rash, fevers, chills, night sweats, weight loss, swollen lymph nodes, body aches, joint swelling, chest pain, shortness of breath, mood changes. POSITIVE muscle aches  Objective  Blood pressure 118/78, pulse (!) 59, height 5\' 5"  (1.651 m), weight 126 lb (57.2 kg), SpO2 99 %.   General: No apparent distress alert and  oriented x3 mood and affect normal, dressed appropriately.  HEENT: Pupils equal, extraocular movements intact  Respiratory: Patient's speak in full sentences and does not appear short of breath  Cardiovascular: No lower extremity edema, non tender, no erythema  Gait relatively normal MSK:  Significant arthritic changes of the right shoulder noted. Back degenerative scoliosis noted.  Some loss of lordosis noted.  Tenderness to palpation in the paraspinal musculature.  Osteopathic findings  C7 flexed rotated and side bent right T3 extended rotated and side bent right inhaled rib T8 extended rotated and side bent left L1 flexed rotated and side bent right Sacrum right on right    Assessment and Plan:  Degenerative disc disease, lumbar Known degenerative disc disease.  Discussed icing regimen and home exercises, discussed which activities to do and which ones to avoid.  Increase activity slowly over the course of next several weeks.  Follow-up again in 6 to 8 weeks.  [name]    Nonallopathic problems  Decision today to treat with OMT was based on Physical Exam  After verbal consent patient was treated with HVLA, ME, FPR techniques in cervical, rib, thoracic, lumbar, and sacral  areas  Patient tolerated the procedure well with improvement in symptoms  Patient given exercises, stretches and lifestyle modifications  See medications in patient instructions if given  Patient will follow up in  4-8 weeks     The above documentation has been reviewed and is accurate and complete [name], DO         Note: This dictation was prepared with Dragon dictation along with smaller phrase technology. Any transcriptional errors that result from this process are unintentional.

## 2022-10-18 ENCOUNTER — Encounter: Payer: Self-pay | Admitting: Family Medicine

## 2022-10-18 ENCOUNTER — Ambulatory Visit (INDEPENDENT_AMBULATORY_CARE_PROVIDER_SITE_OTHER): Payer: Medicare Other | Admitting: Family Medicine

## 2022-10-18 VITALS — BP 118/78 | HR 59 | Ht 65.0 in | Wt 126.0 lb

## 2022-10-18 DIAGNOSIS — M5136 Other intervertebral disc degeneration, lumbar region: Secondary | ICD-10-CM | POA: Diagnosis not present

## 2022-10-18 DIAGNOSIS — M9901 Segmental and somatic dysfunction of cervical region: Secondary | ICD-10-CM | POA: Diagnosis not present

## 2022-10-18 DIAGNOSIS — M9904 Segmental and somatic dysfunction of sacral region: Secondary | ICD-10-CM | POA: Diagnosis not present

## 2022-10-18 DIAGNOSIS — M9908 Segmental and somatic dysfunction of rib cage: Secondary | ICD-10-CM | POA: Diagnosis not present

## 2022-10-18 DIAGNOSIS — M9902 Segmental and somatic dysfunction of thoracic region: Secondary | ICD-10-CM

## 2022-10-18 DIAGNOSIS — M9903 Segmental and somatic dysfunction of lumbar region: Secondary | ICD-10-CM | POA: Diagnosis not present

## 2022-10-18 DIAGNOSIS — M51369 Other intervertebral disc degeneration, lumbar region without mention of lumbar back pain or lower extremity pain: Secondary | ICD-10-CM

## 2022-10-18 NOTE — Patient Instructions (Signed)
See you again in 6-8 weeks 

## 2022-10-18 NOTE — Assessment & Plan Note (Signed)
Known degenerative disc disease.  Discussed icing regimen and home exercises, discussed which activities to do and which ones to avoid.  Increase activity slowly over the course of next several weeks.  Follow-up again in 6 to 8 weeks.  [name]

## 2022-11-02 ENCOUNTER — Ambulatory Visit (INDEPENDENT_AMBULATORY_CARE_PROVIDER_SITE_OTHER): Payer: Medicare Other | Admitting: Sports Medicine

## 2022-11-02 VITALS — BP 110/72 | Ht 65.0 in | Wt 125.0 lb

## 2022-11-02 DIAGNOSIS — M9904 Segmental and somatic dysfunction of sacral region: Secondary | ICD-10-CM | POA: Diagnosis not present

## 2022-11-02 DIAGNOSIS — M9903 Segmental and somatic dysfunction of lumbar region: Secondary | ICD-10-CM

## 2022-11-02 DIAGNOSIS — G8929 Other chronic pain: Secondary | ICD-10-CM | POA: Diagnosis not present

## 2022-11-02 DIAGNOSIS — M9902 Segmental and somatic dysfunction of thoracic region: Secondary | ICD-10-CM

## 2022-11-02 DIAGNOSIS — M9905 Segmental and somatic dysfunction of pelvic region: Secondary | ICD-10-CM

## 2022-11-02 DIAGNOSIS — M5136 Other intervertebral disc degeneration, lumbar region: Secondary | ICD-10-CM

## 2022-11-02 DIAGNOSIS — M9901 Segmental and somatic dysfunction of cervical region: Secondary | ICD-10-CM | POA: Diagnosis not present

## 2022-11-02 DIAGNOSIS — M545 Low back pain, unspecified: Secondary | ICD-10-CM | POA: Diagnosis not present

## 2022-11-02 NOTE — Progress Notes (Signed)
    Jennifer [name] D.[name] [address] [place] [address] Phone: [phone]   Assessment and Plan:     1. Degenerative disc disease, lumbar 2. Chronic left-sided low back pain without sciatica 3. Somatic dysfunction of lumbar region 4. Somatic dysfunction of pelvic region 5. Somatic dysfunction of sacral region 6. Somatic dysfunction of thoracic region 7. Somatic dysfunction of cervical region  -Chronic with exacerbation, subsequent visit - Recurrence of multiple musculoskeletal complaints with most prominent being left-sided low back - Patient has received relief with OMT in the past.  Elects for repeat OMT today.  Tolerated well per note below. - Decision today to treat with OMT was based on Physical Exam  After verbal consent patient was treated with HVLA (high velocity low amplitude), ME (muscle energy), FPR (flex positional release), ST (soft tissue), PC/PD (Pelvic Compression/ Pelvic Decompression) techniques in cervical, sacrum, thoracic, lumbar, and pelvic areas. Patient tolerated the procedure well with improvement in symptoms.  Patient educated on potential side effects of soreness and recommended to rest, hydrate, and use Tylenol as needed for pain control.   Pertinent previous records reviewed include none   Follow Up: Patient has follow-up scheduled with Dr. Katrinka Blazing in 4 to 5 weeks.  Advised to keep this appointment and may follow-up with me sooner if needed for repeat evaluation +/- OMT   Subjective:   I, Moenique Parris, am serving as a Neurosurgeon for Doctor Richardean Sale  Chief Complaint: back pain   HPI:  10/18/2022 Jennifer [name] is a 77 y.o. female coming in with complaint of back and neck pain. OMT on 08/30/2022. Patient states same per usual. No new concerns.  Has had been having tightness but nothing severe.  Continues to workout most days of the week at the gym.  Some discomfort but nothing once again waking her up  at night or stopping her from daily activities.   11/02/22 Patient states that she needs another adjustment and HEP     Relevant Historical Information: Osteoporosis  Additional pertinent review of systems negative.   Current Outpatient Medications:    b complex vitamins tablet, Take 1 tablet by mouth 2 (two) times a week. , Disp: , Rfl:    Calcium Carb-Cholecalciferol (CALCIUM 500 + D3 PO), Take by mouth., Disp: , Rfl:    cholecalciferol (VITAMIN D3) 25 MCG (1000 UNIT) tablet, , Disp: , Rfl:    magnesium oxide (MAG-OX) 400 MG tablet, Take 400 mg by mouth daily., Disp: , Rfl:    omega-3 acid ethyl esters (LOVAZA) 1 G capsule, Take 1 g by mouth 2 (two) times a week. , Disp: , Rfl:    rosuvastatin (CRESTOR) 5 MG tablet, Take 1 tablet (5 mg total) by mouth 3 (three) times a week., Disp: 336 tablet, Rfl: 3   Objective:     Vitals:   11/02/22 1040  BP: 110/72  Weight: 125 lb (56.7 kg)  Height: 5\' 5"  (1.651 m)      Body mass index is 20.8 kg/m.    Physical Exam:    General: Well-appearing, cooperative, sitting comfortably in no acute distress.   OMT Physical Exam:  ASIS Compression Test: Positive left Cervical: TTP paraspinal, C1RL, C6 RR S L Sacrum: Positive sphinx, TTP left sacral base Thoracic: TTP paraspinal, increased kyphosis Lumbar: TTP paraspinal, L2 RRSR Pelvis: Left anterior innominate    Electronically signed by:  Jennifer [name] D.[name] 11:00 AM 11/02/22

## 2022-11-02 NOTE — Patient Instructions (Addendum)
Low back HEP  Keep follow up with Dr. Katrinka Blazing

## 2022-11-08 ENCOUNTER — Other Ambulatory Visit: Payer: Self-pay | Admitting: Internal Medicine

## 2022-11-22 NOTE — Progress Notes (Signed)
[name] [address] [place] [address] Phone: [phone] Subjective:   [name], am serving as a scribe for Dr. Antoine Primas.  I'm seeing this patient by the request  of:  Pincus Sanes, MD  CC: Back and neck pain follow-up  [id number]  Jennifer [name] is a 77 y.o. female coming in with complaint of back and neck pain. OMT on 10/18/2022. Patient states that L side of her back seems unstable as she will get an adjustment and her back will go out a few days later. Pain in L SI joint especially with twisting.   Medications patient has been prescribed:   Taking:         Reviewed prior external information including notes and imaging from previsou exam, outside providers and external EMR if available.   As well as notes that were available from care everywhere and other healthcare systems.  Past medical history, social, surgical and family history all reviewed in electronic medical record.  No pertanent information unless stated regarding to the chief complaint.   Past Medical History:  Diagnosis Date   Allergy    seasonal   Anemia    Arthritis    Cataract    forming   DJD of shoulder    Hyperlipidemia    Insomnia    Internal hemorrhoids    Tubular adenoma of colon    Vitamin D deficiency     Allergies  Allergen Reactions   Latex Other (See Comments)    Patient preference  Sensitivity    Penicillins     REACTION: Childhood reaction     Review of Systems:  No headache, visual changes, nausea, vomiting, diarrhea, constipation, dizziness, abdominal pain, skin rash, fevers, chills, night sweats, weight loss, swollen lymph nodes, body aches, joint swelling, chest pain, shortness of breath, mood changes. POSITIVE muscle aches  Objective  Blood pressure 108/70, pulse 74, height 5\' 5"  (1.651 m), weight 123 lb (55.8 kg), SpO2 98%.   General: No apparent distress alert and oriented x3 mood and affect normal,  dressed appropriately.  HEENT: Pupils equal, extraocular movements intact  Respiratory: Patient's speak in full sentences and does not appear short of breath  Cardiovascular: No lower extremity edema, non tender, no erythema  Back exam does have some loss lordosis noted.  Some tenderness to palpation noted mostly over the sacroiliac joint left greater than right.  Patient does still have significant arthritic changes of the right shoulder.  Some degenerative scoliosis of the lumbar spine.  Does seem to be tighter than usual.  Osteopathic findings  C2 flexed rotated and side bent right C5 flexed rotated and side bent left T3 extended rotated and side bent right inhaled rib T9 extended rotated and side bent left L2 flexed rotated and side bent right L3 flexed rotated and side bent left Sacrum left on left       Assessment and Plan:  Degenerative disc disease, lumbar Chronic problem with worsening symptoms, will repeat x-rays to further evaluate.  Discussed the potential for advanced imaging with patient already failing all of conservative therapy, oral anti-inflammatories, formal physical therapy patient wants to hold at this time but would consider the possibility later on.  Follow-up with me again in 6 to 8 weeks.    Nonallopathic problems  Decision today to treat with OMT was based on Physical Exam  After verbal consent patient was treated with HVLA, ME, FPR techniques in cervical, rib, thoracic, lumbar, and  sacral  areas  Patient tolerated the procedure well with improvement in symptoms  Patient given exercises, stretches and lifestyle modifications  See medications in patient instructions if given  Patient will follow up in 4-8 weeks     The above documentation has been reviewed and is accurate and complete Judi Saa, DO         Note: This dictation was prepared with Dragon dictation along with smaller phrase technology. Any transcriptional errors that result  from this process are unintentional.

## 2022-11-24 ENCOUNTER — Ambulatory Visit (INDEPENDENT_AMBULATORY_CARE_PROVIDER_SITE_OTHER): Payer: Medicare Other | Admitting: Family Medicine

## 2022-11-24 ENCOUNTER — Encounter: Payer: Self-pay | Admitting: Family Medicine

## 2022-11-24 ENCOUNTER — Ambulatory Visit (INDEPENDENT_AMBULATORY_CARE_PROVIDER_SITE_OTHER): Payer: Medicare Other

## 2022-11-24 VITALS — BP 108/70 | HR 74 | Ht 65.0 in | Wt 123.0 lb

## 2022-11-24 DIAGNOSIS — I878 Other specified disorders of veins: Secondary | ICD-10-CM | POA: Diagnosis not present

## 2022-11-24 DIAGNOSIS — M9908 Segmental and somatic dysfunction of rib cage: Secondary | ICD-10-CM

## 2022-11-24 DIAGNOSIS — M25552 Pain in left hip: Secondary | ICD-10-CM | POA: Diagnosis not present

## 2022-11-24 DIAGNOSIS — M9904 Segmental and somatic dysfunction of sacral region: Secondary | ICD-10-CM

## 2022-11-24 DIAGNOSIS — M5136 Other intervertebral disc degeneration, lumbar region: Secondary | ICD-10-CM

## 2022-11-24 DIAGNOSIS — M4316 Spondylolisthesis, lumbar region: Secondary | ICD-10-CM | POA: Diagnosis not present

## 2022-11-24 DIAGNOSIS — M545 Low back pain, unspecified: Secondary | ICD-10-CM

## 2022-11-24 DIAGNOSIS — M9903 Segmental and somatic dysfunction of lumbar region: Secondary | ICD-10-CM | POA: Diagnosis not present

## 2022-11-24 DIAGNOSIS — M9901 Segmental and somatic dysfunction of cervical region: Secondary | ICD-10-CM

## 2022-11-24 DIAGNOSIS — M47816 Spondylosis without myelopathy or radiculopathy, lumbar region: Secondary | ICD-10-CM | POA: Diagnosis not present

## 2022-11-24 DIAGNOSIS — M9902 Segmental and somatic dysfunction of thoracic region: Secondary | ICD-10-CM | POA: Diagnosis not present

## 2022-11-24 NOTE — Assessment & Plan Note (Signed)
Chronic problem with worsening symptoms, will repeat x-rays to further evaluate.  Discussed the potential for advanced imaging with patient already failing all of conservative therapy, oral anti-inflammatories, formal physical therapy patient wants to hold at this time but would consider the possibility later on.  Follow-up with me again in 6 to 8 weeks.

## 2022-11-24 NOTE — Patient Instructions (Signed)
Xray lumbar and pelvis Stay active Enjoy the family See me in 5-6 weeks

## 2022-12-28 NOTE — Progress Notes (Unsigned)
[name] [address] [place] [address] Phone: [phone] Subjective:    I'm seeing this patient by the request  of:  Pincus Sanes, MD  CC: Significant discomfort and arthritic changes of the shoulder  [id number]  Jennifer [name] is a 77 y.o. female coming in with complaint of back and neck pain. OMT on 11/24/2022. Patient states that she is the same .  Overall think she is doing relatively well.  Continuing to have the low back pain but nothing that stopping her from activity at the moment.  Medications patient has been prescribed:   Taking:         Reviewed prior external information including notes and imaging from previsou exam, outside providers and external EMR if available.   As well as notes that were available from care everywhere and other healthcare systems.  Past medical history, social, surgical and family history all reviewed in electronic medical record.  No pertanent information unless stated regarding to the chief complaint.   Past Medical History:  Diagnosis Date   Allergy    seasonal   Anemia    Arthritis    Cataract    forming   DJD of shoulder    Hyperlipidemia    Insomnia    Internal hemorrhoids    Tubular adenoma of colon    Vitamin D deficiency     Allergies  Allergen Reactions   Latex Other (See Comments)    Patient preference  Sensitivity    Penicillins     REACTION: Childhood reaction     Review of Systems:  No headache, visual changes, nausea, vomiting, diarrhea, constipation, dizziness, abdominal pain, skin rash, fevers, chills, night sweats, weight loss, swollen lymph nodes, body aches, joint swelling, chest pain, shortness of breath, mood changes. POSITIVE muscle aches  Objective  There were no vitals taken for this visit.   General: No apparent distress alert and oriented x3 mood and affect normal, dressed appropriately.  HEENT: Pupils equal, extraocular movements intact   Respiratory: Patient's speak in full sentences and does not appear short of breath  Cardiovascular: No lower extremity edema, non tender, no erythema  MSK:  Back does have some loss of lordosis noted.  Does have some scoliosis noted as well. Atrophy noted of the shoulder.  Be worse on the right side.  Limited range of motion.  Osteopathic findings  C2 flexed rotated and side bent right C7 flexed rotated and side bent right T3 extended rotated and side bent right inhaled rib T6 extended rotated and side bent left L1 flexed rotated and side bent right Sacrum right on right       Assessment and Plan:  No problem-specific Assessment & Plan notes found for this encounter.    Nonallopathic problems  Decision today to treat with OMT was based on Physical Exam  After verbal consent patient was treated with HVLA, ME, FPR techniques in cervical, rib, thoracic, lumbar, and sacral  areas  Patient tolerated the procedure well with improvement in symptoms  Patient given exercises, stretches and lifestyle modifications  See medications in patient instructions if given  Patient will follow up in 4-8 weeks     The above documentation has been reviewed and is accurate and complete Judi Saa, DO         Note: This dictation was prepared with Dragon dictation along with smaller phrase technology. Any transcriptional errors that result from this process are unintentional.

## 2022-12-29 ENCOUNTER — Ambulatory Visit (INDEPENDENT_AMBULATORY_CARE_PROVIDER_SITE_OTHER): Payer: Medicare Other | Admitting: Family Medicine

## 2022-12-29 ENCOUNTER — Encounter: Payer: Self-pay | Admitting: Family Medicine

## 2022-12-29 VITALS — BP 120/72 | HR 64 | Ht 65.0 in | Wt 124.0 lb

## 2022-12-29 DIAGNOSIS — M9901 Segmental and somatic dysfunction of cervical region: Secondary | ICD-10-CM

## 2022-12-29 DIAGNOSIS — M9903 Segmental and somatic dysfunction of lumbar region: Secondary | ICD-10-CM | POA: Diagnosis not present

## 2022-12-29 DIAGNOSIS — M9902 Segmental and somatic dysfunction of thoracic region: Secondary | ICD-10-CM

## 2022-12-29 DIAGNOSIS — M9908 Segmental and somatic dysfunction of rib cage: Secondary | ICD-10-CM

## 2022-12-29 DIAGNOSIS — M9904 Segmental and somatic dysfunction of sacral region: Secondary | ICD-10-CM

## 2022-12-29 DIAGNOSIS — M5136 Other intervertebral disc degeneration, lumbar region: Secondary | ICD-10-CM

## 2022-12-29 NOTE — Patient Instructions (Signed)
Great to see you  You are doing great  No changes  See me again in 6 weeks!

## 2022-12-29 NOTE — Assessment & Plan Note (Signed)
Chronic problem but seems to be stable overall.  Nothing that stopping her from activity.  Did more of the muscle energy with some mild HVLA while patient is being treated for osteoporosis as well.  Discussed continuing to work on diet and exercises and increasing activity as tolerated.  Follow-up again in 6 to 8 weeks

## 2023-01-26 ENCOUNTER — Other Ambulatory Visit: Payer: Self-pay | Admitting: Internal Medicine

## 2023-01-26 DIAGNOSIS — Z1231 Encounter for screening mammogram for malignant neoplasm of breast: Secondary | ICD-10-CM

## 2023-02-09 NOTE — Progress Notes (Unsigned)
[name] [address] [place] [address] Phone: [phone] Subjective:   Jennifer [name], am serving as a scribe for Dr. Antoine Primas.  I'm seeing this patient by the request  of:  Pincus Sanes, MD  CC: Back and neck pain follow-up  [id number]  Jennifer [name] is a 77 y.o. female coming in with complaint of back and neck pain. OMT on 12/29/2022. Patient states same per usual. No new concerns.  Medications patient has been prescribed:   Taking:         Reviewed prior external information including notes and imaging from previsou exam, outside providers and external EMR if available.   As well as notes that were available from care everywhere and other healthcare systems.  Past medical history, social, surgical and family history all reviewed in electronic medical record.  No pertanent information unless stated regarding to the chief complaint.   Past Medical History:  Diagnosis Date   Allergy    seasonal   Anemia    Arthritis    Cataract    forming   DJD of shoulder    Hyperlipidemia    Insomnia    Internal hemorrhoids    Tubular adenoma of colon    Vitamin D deficiency     Allergies  Allergen Reactions   Latex Other (See Comments)    Patient preference  Sensitivity    Penicillins     REACTION: Childhood reaction     Review of Systems:  No headache, visual changes, nausea, vomiting, diarrhea, constipation, dizziness, abdominal pain, skin rash, fevers, chills, night sweats, weight loss, swollen lymph nodes, body aches, joint swelling, chest pain, shortness of breath, mood changes. POSITIVE muscle aches  Objective  Blood pressure 108/62, pulse (!) 58, height 5\' 5"  (1.651 m), weight 126 lb (57.2 kg), SpO2 98%.   General: No apparent distress alert and oriented x3 mood and affect normal, dressed appropriately.  HEENT: Pupils equal, extraocular movements intact  Respiratory: Patient's speak in full sentences  and does not appear short of breath  Cardiovascular: No lower extremity edema, non tender, no erythema  Gait MSK:  Back arthritic changes of the back with significant scoliosis noted. Significant arthritic changes of the right shoulder still noted.  No significant worsening of weakness noted. Osteopathic findings  T3 extended rotated and side bent right inhaled rib T9 extended rotated and side bent left T11 extended rotated and side bent right L2 flexed rotated and side bent right Sacrum right on right       Assessment and Plan:  Degenerative disc disease, lumbar Known arthritic changes of her back.  Responded very well to muscle energy and FPR techniques.  Discussed icing regimen and home exercises, which activities to do and which ones to avoid.  Increasing activity slowly otherwise.  I discussed avoiding certain activities.  Continue to work on bone health and muscle strength and conditioning.  Hallux rigidus of both feet Discussed proper shoes and metatarsal pads.  Discussed which activities could be potentially contributing.  Discussed icing regimen and avoiding being barefoot    Nonallopathic problems  Decision today to treat with OMT was based on Physical Exam  After verbal consent patient was treated with , ME, FPR techniques in cervical, rib, thoracic, lumbar, and sacral  areas   Patient given exercises, stretches and lifestyle modifications  See medications in patient instructions if given  Patient will follow up in 4-8 weeks     The above documentation  has been reviewed and is accurate and complete Judi Saa, DO         Note: This dictation was prepared with Dragon dictation along with smaller phrase technology. Any transcriptional errors that result from this process are unintentional.

## 2023-02-15 ENCOUNTER — Ambulatory Visit (INDEPENDENT_AMBULATORY_CARE_PROVIDER_SITE_OTHER): Payer: Medicare Other | Admitting: Family Medicine

## 2023-02-15 VITALS — BP 108/62 | HR 58 | Ht 65.0 in | Wt 126.0 lb

## 2023-02-15 DIAGNOSIS — M2022 Hallux rigidus, left foot: Secondary | ICD-10-CM

## 2023-02-15 DIAGNOSIS — M2021 Hallux rigidus, right foot: Secondary | ICD-10-CM | POA: Diagnosis not present

## 2023-02-15 DIAGNOSIS — M51362 Other intervertebral disc degeneration, lumbar region with discogenic back pain and lower extremity pain: Secondary | ICD-10-CM

## 2023-02-15 DIAGNOSIS — M9902 Segmental and somatic dysfunction of thoracic region: Secondary | ICD-10-CM

## 2023-02-15 DIAGNOSIS — M9904 Segmental and somatic dysfunction of sacral region: Secondary | ICD-10-CM | POA: Diagnosis not present

## 2023-02-15 DIAGNOSIS — M19011 Primary osteoarthritis, right shoulder: Secondary | ICD-10-CM | POA: Diagnosis not present

## 2023-02-15 DIAGNOSIS — M9903 Segmental and somatic dysfunction of lumbar region: Secondary | ICD-10-CM

## 2023-02-15 NOTE — Patient Instructions (Signed)
Good to see you! See you again in 6 weeks 

## 2023-02-16 ENCOUNTER — Encounter: Payer: Self-pay | Admitting: Family Medicine

## 2023-02-16 NOTE — Assessment & Plan Note (Signed)
Continues to remain stable 

## 2023-02-16 NOTE — Assessment & Plan Note (Signed)
Known arthritic changes of her back.  Responded very well to muscle energy and FPR techniques.  Discussed icing regimen and home exercises, which activities to do and which ones to avoid.  Increasing activity slowly otherwise.  I discussed avoiding certain activities.  Continue to work on bone health and muscle strength and conditioning.

## 2023-02-16 NOTE — Assessment & Plan Note (Signed)
Discussed proper shoes and metatarsal pads.  Discussed which activities could be potentially contributing.  Discussed icing regimen and avoiding being barefoot

## 2023-02-27 ENCOUNTER — Telehealth: Payer: Self-pay

## 2023-02-27 NOTE — Telephone Encounter (Signed)
Patient is cleared for PROLIA injection on 03/20/2023 per PA information on 08/30/2022.  Mentioned in provider note last on 03/04/2022. Medication is [facility] supplied. [employer]Pay:$0

## 2023-03-03 ENCOUNTER — Telehealth: Payer: Self-pay | Admitting: Internal Medicine

## 2023-03-03 NOTE — Telephone Encounter (Signed)
Message left for patient.  She can wait and just receive labs on Monday when she is done.

## 2023-03-03 NOTE — Telephone Encounter (Signed)
Pt has a annual on 11.18.24. Pt called to request that she need her labs ordered if possible.  Pt forgot to get them done 1 week before her appt date but she did not have lab orders in her chart for this appt.  However, pt stated she will be at her appt 7:55 and will take her labs after her appt.  Please advise. Thanks.

## 2023-03-05 ENCOUNTER — Encounter: Payer: Self-pay | Admitting: Internal Medicine

## 2023-03-05 DIAGNOSIS — Z23 Encounter for immunization: Secondary | ICD-10-CM | POA: Diagnosis not present

## 2023-03-05 NOTE — Patient Instructions (Addendum)
      Blood work was ordered.   The lab is on the first floor.    Medications changes include :   none     Return in about 1 year (around 03/05/2024) for follow up.

## 2023-03-05 NOTE — Progress Notes (Unsigned)
Subjective:    Patient ID: Jennifer [name], female    DOB: 06-Sep-1945, 77 y.o.   MRN: [id number]     HPI Tauheedah is here for follow up of her chronic medical problems.  Swollen neck glands her whole life.  Her dentist always mentions it.  Has not gotten worse.  No tenderness.  She feels it has been stable for years, but wanted to mention it.    Medications and allergies reviewed with patient and updated if appropriate.  Current Outpatient Medications on File Prior to Visit  Medication Sig Dispense Refill   b complex vitamins tablet Take 1 tablet by mouth 2 (two) times a week.      Calcium Carb-Cholecalciferol (CALCIUM 500 + D3 PO) Take by mouth.     cholecalciferol (VITAMIN D3) 25 MCG (1000 UNIT) tablet      magnesium oxide (MAG-OX) 400 MG tablet Take 400 mg by mouth daily.     omega-3 acid ethyl esters (LOVAZA) 1 G capsule Take 1 g by mouth 2 (two) times a week.      rosuvastatin (CRESTOR) 5 MG tablet TAKE 1 TABLET BY MOUTH 3 TIMES A WEEK. 39 tablet 2   No current facility-administered medications on file prior to visit.     Review of Systems  Constitutional:  Negative for fever.  Respiratory:  Negative for cough, shortness of breath and wheezing.   Cardiovascular:  Negative for chest pain, palpitations and leg swelling.  Gastrointestinal:  Negative for abdominal pain, blood in stool, constipation and diarrhea.       Rare gerd  Musculoskeletal:  Positive for back pain. Negative for arthralgias.  Neurological:  Negative for light-headedness and headaches.  Psychiatric/Behavioral:  Negative for dysphoric mood. The patient is not nervous/anxious.        Objective:   Vitals:   03/06/23 0801  BP: 112/74  Pulse: 60  Temp: 98 F (36.7 C)  SpO2: 97%   BP Readings from Last 3 Encounters:  03/06/23 112/74  02/15/23 108/62  12/29/22 120/72   Wt Readings from Last 3 Encounters:  03/06/23 123 lb 9.6 oz (56.1 kg)  02/15/23 126 lb (57.2 kg)  12/29/22 124 lb  (56.2 kg)   Body mass index is 20.57 kg/m.    Physical Exam Constitutional:      General: She is not in acute distress.    Appearance: Normal appearance.  HENT:     Head: Normocephalic and atraumatic.  Eyes:     Conjunctiva/sclera: Conjunctivae normal.  Cardiovascular:     Rate and Rhythm: Normal rate and regular rhythm.     Heart sounds: Normal heart sounds.  Pulmonary:     Effort: Pulmonary effort is normal. No respiratory distress.     Breath sounds: Normal breath sounds. No wheezing.  Musculoskeletal:     Cervical back: Neck supple.     Right lower leg: No edema.     Left lower leg: No edema.  Lymphadenopathy:     Cervical: No cervical adenopathy.  Skin:    General: Skin is warm and dry.     Findings: No rash.  Neurological:     Mental Status: She is alert. Mental status is at baseline.  Psychiatric:        Mood and Affect: Mood normal.        Behavior: Behavior normal.        Lab Results  Component Value Date   WBC 3.3 (L) 03/07/2022   HGB 13.5 03/07/2022  HCT 39.6 03/07/2022   PLT 286.0 03/07/2022   GLUCOSE 83 03/07/2022   CHOL 193 03/07/2022   TRIG 87.0 03/07/2022   HDL 77.90 03/07/2022   LDLDIRECT 131.6 05/24/2013   LDLCALC 98 03/07/2022   ALT 24 03/07/2022   AST 21 03/07/2022   NA 138 03/07/2022   K 4.0 03/07/2022   CL 103 03/07/2022   CREATININE 0.80 03/07/2022   BUN 14 03/07/2022   CO2 27 03/07/2022   TSH 0.94 05/24/2013     Assessment & Plan:    See Problem List for Assessment and Plan of chronic medical problems.

## 2023-03-06 ENCOUNTER — Encounter: Payer: Self-pay | Admitting: Internal Medicine

## 2023-03-06 ENCOUNTER — Ambulatory Visit (INDEPENDENT_AMBULATORY_CARE_PROVIDER_SITE_OTHER): Payer: Medicare Other | Admitting: Internal Medicine

## 2023-03-06 VITALS — BP 112/74 | HR 60 | Temp 98.0°F | Ht 65.0 in | Wt 123.6 lb

## 2023-03-06 DIAGNOSIS — M810 Age-related osteoporosis without current pathological fracture: Secondary | ICD-10-CM

## 2023-03-06 DIAGNOSIS — E7849 Other hyperlipidemia: Secondary | ICD-10-CM

## 2023-03-06 DIAGNOSIS — Z136 Encounter for screening for cardiovascular disorders: Secondary | ICD-10-CM

## 2023-03-06 DIAGNOSIS — I700 Atherosclerosis of aorta: Secondary | ICD-10-CM

## 2023-03-06 LAB — CBC WITH DIFFERENTIAL/PLATELET
Basophils Absolute: 0 10*3/uL (ref 0.0–0.1)
Basophils Relative: 0.5 % (ref 0.0–3.0)
Eosinophils Absolute: 0 10*3/uL (ref 0.0–0.7)
Eosinophils Relative: 0.9 % (ref 0.0–5.0)
HCT: 39.6 % (ref 36.0–46.0)
Hemoglobin: 13.5 g/dL (ref 12.0–15.0)
Lymphocytes Relative: 26.5 % (ref 12.0–46.0)
Lymphs Abs: 1.3 10*3/uL (ref 0.7–4.0)
MCHC: 34.3 g/dL (ref 30.0–36.0)
MCV: 94.1 fL (ref 78.0–100.0)
Monocytes Absolute: 0.7 10*3/uL (ref 0.1–1.0)
Monocytes Relative: 13.2 % — ABNORMAL HIGH (ref 3.0–12.0)
Neutro Abs: 3 10*3/uL (ref 1.4–7.7)
Neutrophils Relative %: 58.9 % (ref 43.0–77.0)
Platelets: 262 10*3/uL (ref 150.0–400.0)
RBC: 4.2 Mil/uL (ref 3.87–5.11)
RDW: 13.5 % (ref 11.5–15.5)
WBC: 5 10*3/uL (ref 4.0–10.5)

## 2023-03-06 LAB — COMPREHENSIVE METABOLIC PANEL
ALT: 16 U/L (ref 0–35)
AST: 19 U/L (ref 0–37)
Albumin: 4.2 g/dL (ref 3.5–5.2)
Alkaline Phosphatase: 59 U/L (ref 39–117)
BUN: 14 mg/dL (ref 6–23)
CO2: 26 meq/L (ref 19–32)
Calcium: 9.2 mg/dL (ref 8.4–10.5)
Chloride: 102 meq/L (ref 96–112)
Creatinine, Ser: 0.75 mg/dL (ref 0.40–1.20)
GFR: 77.6 mL/min (ref >60.00)
Glucose, Bld: 80 mg/dL (ref 70–99)
Potassium: 4 meq/L (ref 3.5–5.1)
Sodium: 136 meq/L (ref 135–145)
Total Bilirubin: 0.6 mg/dL (ref 0.2–1.2)
Total Protein: 6.6 g/dL (ref 6.0–8.3)

## 2023-03-06 LAB — TSH: TSH: 1.43 u[IU]/mL (ref 0.35–5.50)

## 2023-03-06 LAB — LIPID PANEL
Cholesterol: 205 mg/dL — ABNORMAL HIGH (ref 0–200)
HDL: 75.2 mg/dL (ref >39.00)
LDL Cholesterol: 109 mg/dL — ABNORMAL HIGH (ref 0–99)
NonHDL: 130.04
Total CHOL/HDL Ratio: 3
Triglycerides: 105 mg/dL (ref 0.0–149.0)
VLDL: 21 mg/dL (ref 0.0–40.0)

## 2023-03-06 LAB — VITAMIN D 25 HYDROXY (VIT D DEFICIENCY, FRACTURES): VITD: 59.46 ng/mL (ref 30.00–100.00)

## 2023-03-06 NOTE — Assessment & Plan Note (Addendum)
Chronic Continue rosuvastatin 5 mg 3 times weekly  Check lipid panel, CMP Regular exercise and healthy diet encouraged

## 2023-03-06 NOTE — Assessment & Plan Note (Signed)
Chronic Regular exercise and healthy diet encouraged Check lipid panel, CMP, tsh Continue Crestor 5 mg 3 times weekly

## 2023-03-06 NOTE — Assessment & Plan Note (Addendum)
Chronic DEXA up to date Stressed regular exercise Eating a high calcium diet  Continue vitamin D daily Check vitamin D level Discussed risks of fracture We have discussed medication options in the past-she would prefer not to take any medication Continue to monitor bone density every 2 years

## 2023-03-10 ENCOUNTER — Ambulatory Visit: Payer: Medicare Other

## 2023-03-20 ENCOUNTER — Ambulatory Visit (INDEPENDENT_AMBULATORY_CARE_PROVIDER_SITE_OTHER): Payer: Medicare Other

## 2023-03-20 DIAGNOSIS — M810 Age-related osteoporosis without current pathological fracture: Secondary | ICD-10-CM | POA: Diagnosis not present

## 2023-03-20 MED ORDER — DENOSUMAB 60 MG/ML ~~LOC~~ SOSY
60.0000 mg | PREFILLED_SYRINGE | Freq: Once | SUBCUTANEOUS | Status: AC
  Administered 2023-03-20: 60 mg via SUBCUTANEOUS

## 2023-03-20 NOTE — Addendum Note (Signed)
Addended byParticia Nearing on: 03/20/2023 02:09 PM   Modules accepted: Orders

## 2023-03-20 NOTE — Progress Notes (Signed)
PT visits today for their prolia injection. PT informed of what they were receiving and tolerated injection well. PT informed to reach out to office if needed.

## 2023-04-03 ENCOUNTER — Ambulatory Visit: Payer: Medicare Other | Admitting: Family Medicine

## 2023-04-03 VITALS — BP 108/62 | HR 65 | Ht 65.0 in | Wt 126.0 lb

## 2023-04-03 DIAGNOSIS — M9901 Segmental and somatic dysfunction of cervical region: Secondary | ICD-10-CM

## 2023-04-03 DIAGNOSIS — M51362 Other intervertebral disc degeneration, lumbar region with discogenic back pain and lower extremity pain: Secondary | ICD-10-CM

## 2023-04-03 DIAGNOSIS — M9902 Segmental and somatic dysfunction of thoracic region: Secondary | ICD-10-CM | POA: Diagnosis not present

## 2023-04-03 DIAGNOSIS — M9903 Segmental and somatic dysfunction of lumbar region: Secondary | ICD-10-CM

## 2023-04-03 DIAGNOSIS — M9908 Segmental and somatic dysfunction of rib cage: Secondary | ICD-10-CM | POA: Diagnosis not present

## 2023-04-03 DIAGNOSIS — M9904 Segmental and somatic dysfunction of sacral region: Secondary | ICD-10-CM

## 2023-04-03 NOTE — Patient Instructions (Signed)
You are amazing  Happy holidays!  See me again in 6-8 weeks!

## 2023-04-03 NOTE — Assessment & Plan Note (Signed)
That seems to be worsening with extension of the back.  We discussed icing regimen and home exercises, discussed avoiding certain activities.  Discussed with home exercises.  Follow-up again in 6 to 8 weeks otherwise.

## 2023-04-03 NOTE — Progress Notes (Signed)
[name] [address] [place] [address] Phone: [phone] Subjective:   INadine Counts, am serving as a scribe for Dr. Antoine Primas.  I'm seeing this patient by the request  of:  Pincus Sanes, MD  CC: Back and neck pain follow-up  [id number]  Junie Boschetti is a 77 y.o. female coming in with complaint of back and neck pain. OMT on 02/15/2023. Patient states same per usual. No new concerns.  Medications patient has been prescribed:   Taking:         Reviewed prior external information including notes and imaging from previsou exam, outside providers and external EMR if available.   As well as notes that were available from care everywhere and other healthcare systems.  Past medical history, social, surgical and family history all reviewed in electronic medical record.  No pertanent information unless stated regarding to the chief complaint.   Past Medical History:  Diagnosis Date   Allergy    seasonal   Anemia    Arthritis    Cataract    forming   DJD of shoulder    Hyperlipidemia    Insomnia    Internal hemorrhoids    Tubular adenoma of colon    Vitamin D deficiency     Allergies  Allergen Reactions   Latex Other (See Comments)    Patient preference  Sensitivity    Penicillins     REACTION: Childhood reaction     Review of Systems:  No headache, visual changes, nausea, vomiting, diarrhea, constipation, dizziness, abdominal pain, skin rash, fevers, chills, night sweats, weight loss, swollen lymph nodes, body aches, joint swelling, chest pain, shortness of breath, mood changes. POSITIVE muscle aches  Objective  Blood pressure 108/62, pulse 65, height 5\' 5"  (1.651 m), weight 126 lb (57.2 kg), SpO2 98%.   General: No apparent distress alert and oriented x3 mood and affect normal, dressed appropriately.  HEENT: Pupils equal, extraocular movements intact  Respiratory: Patient's speak in full sentences  and does not appear short of breath  Cardiovascular: No lower extremity edema, non tender, no erythema  Gait MSK:  Back does have some loss lordosis noted.  Some tenderness to palpation in the paraspinal musculature.  Osteopathic findings  C3 flexed rotated and side bent right C7 flexed rotated and side bent left T3 extended rotated and side bent right inhaled rib T4 extended rotated and side bent left with inhaled rib L2 flexed rotated and side bent right L4 flexed rotated and side bent left Sacrum right on right       Assessment and Plan:  Degenerative disc disease, lumbar That seems to be worsening with extension of the back.  We discussed icing regimen and home exercises, discussed avoiding certain activities.  Discussed with home exercises.  Follow-up again in 6 to 8 weeks otherwise.    Nonallopathic problems  Decision today to treat with OMT was based on Physical Exam  After verbal consent patient was treated with HVLA, ME, FPR techniques in cervical, rib, thoracic, lumbar, and sacral  areas  Patient tolerated the procedure well with improvement in symptoms  Patient given exercises, stretches and lifestyle modifications  See medications in patient instructions if given  Patient will follow up in 4-8 weeks     The above documentation has been reviewed and is accurate and complete Judi Saa, DO         Note: This dictation was prepared with Dragon dictation along with smaller  Lobbyist. Any transcriptional errors that result from this process are unintentional.

## 2023-04-05 ENCOUNTER — Encounter: Payer: Self-pay | Admitting: Family Medicine

## 2023-04-14 ENCOUNTER — Encounter: Payer: Self-pay | Admitting: Internal Medicine

## 2023-04-14 DIAGNOSIS — Z23 Encounter for immunization: Secondary | ICD-10-CM | POA: Diagnosis not present

## 2023-05-13 ENCOUNTER — Telehealth: Payer: Medicare Other | Admitting: Family Medicine

## 2023-05-13 DIAGNOSIS — U071 COVID-19: Secondary | ICD-10-CM | POA: Diagnosis not present

## 2023-05-13 MED ORDER — NIRMATRELVIR/RITONAVIR (PAXLOVID)TABLET: 3.0000 | ORAL_TABLET | Freq: Two times a day (BID) | ORAL | 0 refills | Status: DC

## 2023-05-13 NOTE — Patient Instructions (Signed)

## 2023-05-13 NOTE — Progress Notes (Signed)
Virtual Visit Consent   Jennifer [name], you are scheduled for a virtual visit with a [facility] provider today. Just as with appointments in the office, your consent must be obtained to participate. Your consent will be active for this visit and any virtual visit you may have with one of our providers in the next 365 days. If you have a MyChart account, a copy of this consent can be sent to you electronically.  As this is a virtual visit, video technology does not allow for your provider to perform a traditional examination. This may limit your provider's ability to fully assess your condition. If your provider identifies any concerns that need to be evaluated in person or the need to arrange testing (such as labs, EKG, etc.), we will make arrangements to do so. Although advances in technology are sophisticated, we cannot ensure that it will always work on either your end or our end. If the connection with a video visit is poor, the visit may have to be switched to a telephone visit. With either a video or telephone visit, we are not always able to ensure that we have a secure connection.  By engaging in this virtual visit, you consent to the provision of healthcare and authorize for your insurance to be billed (if applicable) for the services provided during this visit. Depending on your insurance coverage, you may receive a charge related to this service.  I need to obtain your verbal consent now. Are you willing to proceed with your visit today? Jennifer [name] has provided verbal consent on 05/13/2023 for a virtual visit (video or telephone). Georgana Curio, FNP  Date: 05/13/2023 2:00 PM  Virtual Visit via Video Note   I, Georgana Curio, connected with  Jennifer [name]  ([id number], [birth year]) on 05/13/23 at  2:00 PM EST by a video-enabled telemedicine application and verified that I am speaking with the correct person using two identifiers.  Location: Patient: Virtual Visit  Location Patient: Home Provider: Virtual Visit Location Provider: Home Office   I discussed the limitations of evaluation and management by telemedicine and the availability of in person appointments. The patient expressed understanding and agreed to proceed.    History of Present Illness: Jennifer [name] is a 78 y.o. who identifies as a female who was assigned female at birth, and is being seen today for covid positive testing with sx for 3 days persistent. Head congestion, HA, fever, aches and chills with slight cough. She would like to try paxlovid. Denies kidney problems. 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 Katrinka Blazing 12/29/2009   Allergic rhinitis 09/04/2009   CONSTIPATION, INTERMITTENT 09/04/2009   Hyperlipidemia 12/11/2007    Allergies:  Allergies  Allergen Reactions   Latex Other (See Comments)    Patient preference  Sensitivity    Penicillins     REACTION: Childhood reaction  Medications:  Current Outpatient Medications:    nirmatrelvir/ritonavir (PAXLOVID) 20 x 150 MG & 10 x 100MG  TABS, Take 3 tablets by mouth 2 (two) times daily for 5 days. (Take nirmatrelvir 150 mg two tablets twice daily for 5 days and ritonavir 100 mg one tablet twice daily for 5 days) Patient GFR is 77.6, Disp: 30 tablet, Rfl: 0   b  complex vitamins tablet, Take 1 tablet by mouth 2 (two) times a week. , Disp: , Rfl:    cholecalciferol (VITAMIN D3) 25 MCG (1000 UNIT) tablet, , Disp: , Rfl:    co-enzyme Q-10 30 MG capsule, Take 30 mg by mouth 3 (three) times daily., Disp: , Rfl:    magnesium oxide (MAG-OX) 400 MG tablet, Take 400 mg by mouth daily., Disp: , Rfl:    omega-3 acid ethyl esters (LOVAZA) 1 G capsule, Take 1 g by mouth 2 (two) times a week. , Disp: , Rfl:    rosuvastatin (CRESTOR) 5 MG tablet, TAKE 1 TABLET BY MOUTH 3 TIMES A WEEK., Disp: 39 tablet, Rfl: 2  Observations/Objective: Patient is well-developed, well-nourished in no acute distress.  Resting comfortably  at home.  Head is normocephalic, atraumatic.  No labored breathing.  Speech is clear and coherent with logical content.  Patient is alert and oriented at baseline.    Assessment and Plan: 1. COVID (Primary)  Increase fluids, humidifier at night, tylenol or ibuprofen as directed, quarantine discussed, MVI with Vit C, D and zinc. UC if sx worsen.   Follow Up Instructions: I discussed the assessment and treatment plan with the patient. The patient was provided an opportunity to ask questions and all were answered. The patient agreed with the plan and demonstrated an understanding of the instructions.  A copy of instructions were sent to the patient via MyChart unless otherwise noted below.     The patient was advised to call back or seek an in-person evaluation if the symptoms worsen or if the condition fails to improve as anticipated.    Georgana Curio, FNP

## 2023-05-16 ENCOUNTER — Telehealth (INDEPENDENT_AMBULATORY_CARE_PROVIDER_SITE_OTHER): Payer: Medicare Other | Admitting: Family Medicine

## 2023-05-16 ENCOUNTER — Encounter: Payer: Self-pay | Admitting: Family Medicine

## 2023-05-16 ENCOUNTER — Ambulatory Visit: Payer: Self-pay | Admitting: Internal Medicine

## 2023-05-16 VITALS — HR 69

## 2023-05-16 DIAGNOSIS — R21 Rash and other nonspecific skin eruption: Secondary | ICD-10-CM

## 2023-05-16 DIAGNOSIS — T50905A Adverse effect of unspecified drugs, medicaments and biological substances, initial encounter: Secondary | ICD-10-CM | POA: Diagnosis not present

## 2023-05-16 DIAGNOSIS — L299 Pruritus, unspecified: Secondary | ICD-10-CM

## 2023-05-16 MED ORDER — PREDNISONE 20 MG PO TABS: 40.0000 mg | ORAL_TABLET | Freq: Every day | ORAL | 0 refills | Status: AC

## 2023-05-16 MED ORDER — TRIAMCINOLONE ACETONIDE 0.1 % EX CREA: 1.0000 | TOPICAL_CREAM | Freq: Two times a day (BID) | CUTANEOUS | 0 refills | Status: DC

## 2023-05-16 NOTE — Patient Instructions (Addendum)
STOP Paxlovid  Zyrtec daily for rash and itching.  I have sent in prednisone for you to take 2 tablets once daily in the morning with breakfast for the next 5 days.  I have sent in triamcinolone cream for you to try to use topically twice a day.  Follow-up with me for new or worsening symptoms.

## 2023-05-16 NOTE — Progress Notes (Addendum)
Virtual Visit via Video Note  I connected with Jennifer [name] on 05/16/23 at  4:00 PM EST by a video enabled telemedicine application and verified that I am speaking with the correct person using two identifiers.  Patient Location: Home Provider Location: Office/Clinic  I discussed the limitations, risks, security, and privacy concerns of performing an evaluation and management service by video and the availability of in person appointments. I also discussed with the patient that there may be a patient responsible charge related to this service. The patient expressed understanding and agreed to proceed.  Subjective: PCP: Pincus Sanes, MD  Chief Complaint  Patient presents with   Covid Positive    Patient states that she started paxlovid Saturday evening and she has a rash from it today. She is concerned if she should stop the medication because she read that it can be bad to have a ash from this medication    HPI Presents for evaluation of rash to the left abdomen and ribs since 9:30 or 10 AM this morning.  Took her sixth dose of Paxlovid for COVID treatment this morning at 7:00.  Denies rash previously. Describes as red raised and itchy. Has not attempted OTC treatment. Reports that COVID symptoms have improved. States that she felt better today, and was outside for a walk. Has not taken Paxlovid in the past, has had COVID in the past. She is not concurrently taking statins with Paxlovid. Denies any shortness of breath, palpitations, increased heart rate, worsening fatigue, swelling, other concerning symptoms.  ROS: Per HPI  Current Outpatient Medications:    b complex vitamins tablet, Take 1 tablet by mouth 2 (two) times a week. , Disp: , Rfl:    cholecalciferol (VITAMIN D3) 25 MCG (1000 UNIT) tablet, , Disp: , Rfl:    co-enzyme Q-10 30 MG capsule, Take 30 mg by mouth 3 (three) times a week., Disp: , Rfl:    magnesium oxide (MAG-OX) 400 MG tablet, Take 400 mg by mouth  2 (two) times a week., Disp: , Rfl:    omega-3 acid ethyl esters (LOVAZA) 1 G capsule, Take 1 g by mouth as needed. Patient takes this medication when she remembers, Disp: , Rfl:    predniSONE (DELTASONE) 20 MG tablet, Take 2 tablets (40 mg total) by mouth daily for 5 days., Disp: 10 tablet, Rfl: 0   rosuvastatin (CRESTOR) 5 MG tablet, TAKE 1 TABLET BY MOUTH 3 TIMES A WEEK., Disp: 39 tablet, Rfl: 2   triamcinolone cream (KENALOG) 0.1 %, Apply 1 Application topically 2 (two) times daily., Disp: 30 g, Rfl: 0  Observations/Objective: Today's Vitals   05/16/23 1609  Pulse: 69  SpO2: 98%   Physical Exam Vitals and nursing note reviewed.  Constitutional:      General: She is not in acute distress. HENT:     Head: Normocephalic and atraumatic.  Eyes:     Extraocular Movements: Extraocular movements intact.  Pulmonary:     Effort: Pulmonary effort is normal.  Musculoskeletal:     Cervical back: Normal range of motion.  Skin:         Comments: Patch of fine, erythematous, papular rash. No discharge, no bleeding noted.   Neurological:     General: No focal deficit present.     Mental Status: She is alert and oriented to person, place, and time.  Psychiatric:        Mood and Affect: Mood normal.        Behavior: Behavior normal.  Assessment and Plan: Adverse effect of drug, initial encounter -     predniSONE; Take 2 tablets (40 mg total) by mouth daily for 5 days.  Dispense: 10 tablet; Refill: 0  Rash -     predniSONE; Take 2 tablets (40 mg total) by mouth daily for 5 days.  Dispense: 10 tablet; Refill: 0  Pruritus -     Triamcinolone Acetonide; Apply 1 Application topically 2 (two) times daily.  Dispense: 30 g; Refill: 0  - Discussed that this could be paxlovid reaction and to stop the paxlovid.  Paxlovid added to allergy list  Follow Up Instructions: Return if symptoms worsen or fail to improve.   I discussed the assessment and treatment plan with the patient. The patient  was provided an opportunity to ask questions, and all were answered. The patient agreed with the plan and demonstrated an understanding of the instructions.   The patient was advised to call back or seek an in-person evaluation if the symptoms worsen or if the condition fails to improve as anticipated.  The above assessment and management plan was discussed with the patient. The patient verbalized understanding of and has agreed to the management plan.   Moshe Cipro, FNP

## 2023-05-16 NOTE — Telephone Encounter (Signed)
Copied from CRM [id number]. Topic: Clinical - Red Word Triage >> May 16, 2023 11:00 AM Adele Barthel wrote: [facility] that prompted transfer to Nurse Triage: Patient tested positive for COVID this paste weekend. Was prescribed Paxlovid, taking 2 x day since Saturday. Has now taken 6 doses. Developed rash consisting of little red dots on stomach today, slightly itchy. No other symptoms other than metallic taste in mouth.  Chief Complaint: rash Symptoms: Small red dots abd only, itchy, some raised and some flat Frequency: yesterday Pertinent Negatives: Patient denies drainage Disposition: [] ED /[] Urgent Care (no appt availability in office) / [] Appointment(In office/virtual)/ [x]  [facility] [place] [facility]/ [] Home Care/ [] Refused Recommended Disposition /[] [place] [facility]/ []  Follow-up with PCP Additional Notes: pt thinks she is having a reaction to paxlovid and wants to know should she continue with medication  Reason for Disposition  Localized rash present > 7 days  Answer Assessment - Initial Assessment Questions 1.) CALLER DIAGNOSIS: "What do you think is causing the rash?" (e.g., athlete's foot, chickenpox, hives, impetigo) pt thinks allergic reaction to paxlovid  2.) LOCALIZED OR WIDESPREAD:  "Is the rash all over (widespread) or mostly just in one area of the body (localized)?"  Local  3.) NEW MEDICINES: "Are you taking any new medicine?" paxlovid  4.) APPEARANCE of RASH: "Describe the rash. What color is it?" (Note: It is difficult to assess rash color in people with darker-colored skin. When this situation occurs, simply ask the caller to describe what they see.) Small red dots abd only, itchy, some raised and some flat  Protocols used: Rash - Guideline Selection-A-AH, Rash or Redness - Localized-A-AH

## 2023-05-18 ENCOUNTER — Ambulatory Visit: Payer: Medicare Other | Admitting: Family Medicine

## 2023-05-23 NOTE — Progress Notes (Signed)
 [name] [address] [place] [address] Phone: [phone] Subjective:   Jennifer [name], am serving as a scribe for Dr. Arthea Claudene.  I'm seeing this patient by the request  of:  Geofm Glade PARAS, MD  CC: Back and neck pain follow-up  YEP:Dlagzrupcz  Jennifer [name] is a 78 y.o. female coming in with complaint of back and neck pain. OMT on 04/03/2023. Patient states that she is doing well. Here for a tune up.           Reviewed prior external information including notes and imaging from previsou exam, outside providers and external EMR if available.   As well as notes that were available from care everywhere and other healthcare systems.  Past medical history, social, surgical and family history all reviewed in electronic medical record.  No pertanent information unless stated regarding to the chief complaint.   Past Medical History:  Diagnosis Date   Allergy    seasonal   Anemia    Arthritis    Cataract    forming   DJD of shoulder    Hyperlipidemia    Insomnia    Internal hemorrhoids    Tubular adenoma of colon    Vitamin D  deficiency     Allergies  Allergen Reactions   Paxlovid  [Nirmatrelvir -Ritonavir ] Rash   Latex Other (See Comments)    Patient preference  Sensitivity    Penicillins     REACTION: Childhood reaction     Review of Systems:  No headache, visual changes, nausea, vomiting, diarrhea, constipation, dizziness, abdominal pain, skin rash, fevers, chills, night sweats, weight loss, swollen lymph nodes, body aches, joint swelling, chest pain, shortness of breath, mood changes. POSITIVE muscle aches  Objective  Blood pressure 110/76, pulse 60, height 5' 5 (1.651 m), weight 127 lb (57.6 kg), SpO2 97%.   General: No apparent distress alert and oriented x3 mood and affect normal, dressed appropriately.  HEENT: Pupils equal, extraocular movements intact  Respiratory: Patient's speak in full sentences  and does not appear short of breath  Cardiovascular: No lower extremity edema, non tender, no erythema  Gait normal MSK:  Back does have some loss lordosis noted.  Some tenderness to palpation in the paraspinal musculature.  Patient does have some degenerative scoliosis noted as well.  Significant arthritic changes and atrophy noted of the shoulder girdle.  Limited range of motion noted as well of the right shoulder.  Osteopathic findings C6 flexed rotated and side bent left T3 extended rotated and side bent right inhaled rib T8 extended rotated and side bent left L2 flexed rotated and side bent right Sacrum right on right     Assessment and Plan:  Degenerative disc disease, lumbar Degenerative disc disease.  Discussed icing regimen and home exercises.  Discussed which activities to do and which ones to avoid.  Increase activity slowly over the course the next several weeks.  Follow-up with me again in 6 to 8 weeks.    Nonallopathic problems  Decision today to treat with OMT was based on Physical Exam  After verbal consent patient was treated with  ME, FPR techniques in cervical, rib, thoracic, lumbar, and sacral  areas  Patient tolerated the procedure well with improvement in symptoms  Patient given exercises, stretches and lifestyle modifications  See medications in patient instructions if given  Patient will follow up in 4-8 weeks     The above documentation has been reviewed and is accurate and complete Jennifer [name]  Jennifer Sharps, DO         Note: This dictation was prepared with Dragon dictation along with smaller phrase technology. Any transcriptional errors that result from this process are unintentional.

## 2023-05-25 ENCOUNTER — Ambulatory Visit: Payer: Medicare Other | Admitting: Family Medicine

## 2023-05-25 ENCOUNTER — Encounter: Payer: Self-pay | Admitting: Family Medicine

## 2023-05-25 VITALS — BP 110/76 | HR 60 | Ht 65.0 in | Wt 127.0 lb

## 2023-05-25 DIAGNOSIS — M9904 Segmental and somatic dysfunction of sacral region: Secondary | ICD-10-CM | POA: Diagnosis not present

## 2023-05-25 DIAGNOSIS — M9908 Segmental and somatic dysfunction of rib cage: Secondary | ICD-10-CM | POA: Diagnosis not present

## 2023-05-25 DIAGNOSIS — M9903 Segmental and somatic dysfunction of lumbar region: Secondary | ICD-10-CM

## 2023-05-25 DIAGNOSIS — M9901 Segmental and somatic dysfunction of cervical region: Secondary | ICD-10-CM

## 2023-05-25 DIAGNOSIS — M51362 Other intervertebral disc degeneration, lumbar region with discogenic back pain and lower extremity pain: Secondary | ICD-10-CM | POA: Diagnosis not present

## 2023-05-25 DIAGNOSIS — M9902 Segmental and somatic dysfunction of thoracic region: Secondary | ICD-10-CM | POA: Diagnosis not present

## 2023-05-25 NOTE — Patient Instructions (Signed)
 Good to see you! Hope everything works out with getting your family here [facility] See you again in 6-8 weeks

## 2023-05-25 NOTE — Assessment & Plan Note (Signed)
 Degenerative disc disease.  Discussed icing regimen and home exercises.  Discussed which activities to do and which ones to avoid.  Increase activity slowly over the course the next several weeks.  Follow-up with me again in 6 to 8 weeks.

## 2023-05-29 DIAGNOSIS — L821 Other seborrheic keratosis: Secondary | ICD-10-CM | POA: Diagnosis not present

## 2023-05-29 DIAGNOSIS — D1801 Hemangioma of skin and subcutaneous tissue: Secondary | ICD-10-CM | POA: Diagnosis not present

## 2023-05-29 DIAGNOSIS — L812 Freckles: Secondary | ICD-10-CM | POA: Diagnosis not present

## 2023-05-29 DIAGNOSIS — L820 Inflamed seborrheic keratosis: Secondary | ICD-10-CM | POA: Diagnosis not present

## 2023-05-30 ENCOUNTER — Telehealth: Payer: Self-pay | Admitting: Internal Medicine

## 2023-05-30 NOTE — Telephone Encounter (Signed)
Number sent via my-chart per patient's request today.

## 2023-05-30 NOTE — Telephone Encounter (Signed)
Can call them to try to schedule - Phone: [phone]

## 2023-05-30 NOTE — Telephone Encounter (Signed)
Copied from CRM [id number]. Topic: Referral - Question >> May 30, 2023 11:53 AM Irine Seal wrote: Reason for CRM: patient calling in regards to CT CARDIAC SCORING (SELF PAY ONLY) (Order [id number]), [place] out to her for scheduling, she is wanting to know if this is still valid and the clinic could arrange for them to call her back for scheduling

## 2023-06-01 ENCOUNTER — Ambulatory Visit (HOSPITAL_BASED_OUTPATIENT_CLINIC_OR_DEPARTMENT_OTHER)
Admission: RE | Admit: 2023-06-01 | Discharge: 2023-06-01 | Disposition: A | Discharge status: Home / Self Care | Payer: Self-pay | Source: Ambulatory Visit | Attending: Internal Medicine | Admitting: Internal Medicine

## 2023-06-01 DIAGNOSIS — Z136 Encounter for screening for cardiovascular disorders: Secondary | ICD-10-CM | POA: Insufficient documentation

## 2023-06-03 ENCOUNTER — Encounter: Payer: Self-pay | Admitting: Internal Medicine

## 2023-06-09 ENCOUNTER — Other Ambulatory Visit: Payer: Self-pay

## 2023-06-09 DIAGNOSIS — M810 Age-related osteoporosis without current pathological fracture: Secondary | ICD-10-CM

## 2023-06-09 MED ORDER — DENOSUMAB 60 MG/ML ~~LOC~~ SOSY: 60.0000 mg | PREFILLED_SYRINGE | Freq: Once | SUBCUTANEOUS | Status: AC

## 2023-07-03 ENCOUNTER — Ambulatory Visit
Admission: RE | Admit: 2023-07-03 | Discharge: 2023-07-03 | Disposition: A | Discharge status: Home / Self Care | Payer: Medicare Other | Source: Ambulatory Visit | Attending: Internal Medicine | Admitting: Internal Medicine

## 2023-07-03 DIAGNOSIS — Z1231 Encounter for screening mammogram for malignant neoplasm of breast: Secondary | ICD-10-CM

## 2023-07-05 NOTE — Progress Notes (Unsigned)
 [name] [address] [place] [address] Phone: [phone] Subjective:   [name], am serving as a scribe for Dr. Antoine Primas.  I'm seeing this patient by the request  of:  Pincus Sanes, MD  CC: back and neck pain follow up   LKG:[id number]  Jodie Cavey is a 78 y.o. female coming in with complaint of back and neck pain. OMT on 05/25/2023. Patient states that she is doing well. Hips have been painful when she goes up inclines but ok today.   Medications patient has been prescribed:   Taking:         Reviewed prior external information including notes and imaging from previsou exam, outside providers and external EMR if available.   As well as notes that were available from care everywhere and other healthcare systems.  Past medical history, social, surgical and family history all reviewed in electronic medical record.  No pertanent information unless stated regarding to the chief complaint.   Past Medical History:  Diagnosis Date   Allergy    seasonal   Anemia    Arthritis    Cataract    forming   DJD of shoulder    Hyperlipidemia    Insomnia    Internal hemorrhoids    Tubular adenoma of colon    Vitamin D deficiency     Allergies  Allergen Reactions   Paxlovid [Nirmatrelvir-Ritonavir] Rash   Latex Other (See Comments)    Patient preference  Sensitivity    Penicillins     REACTION: Childhood reaction     Review of Systems:  No headache, visual changes, nausea, vomiting, diarrhea, constipation, dizziness, abdominal pain, skin rash, fevers, chills, night sweats, weight loss, swollen lymph nodes, body aches, joint swelling, chest pain, shortness of breath, mood changes. POSITIVE muscle aches  Objective  Blood pressure 102/62, pulse 70, height 5\' 5"  (1.651 m), SpO2 97%.   General: No apparent distress alert and oriented x3 mood and affect normal, dressed appropriately.  HEENT: Pupils equal,  extraocular movements intact  Respiratory: Patient's speak in full sentences and does not appear short of breath  Cardiovascular: No lower extremity edema, non tender, no erythema  MSK:  Back does have some degenerative scoliosis noted.  Still has the arthritic changes noted of the shoulder.  Osteopathic findings  C2 flexed rotated and side bent right C7 flexed rotated and side bent left T3 extended rotated and side bent right inhaled rib T7 extended rotated and side bent left L2 flexed rotated and side bent right Sacrum right on right       Assessment and Plan:  Degenerative disc disease, lumbar DDD, discussed HEP  Discussed home exercises  Continue to work on core strength  RTC in 6 weeks     Nonallopathic problems  Decision today to treat with OMT was based on Physical Exam  After verbal consent patient was treated with HVLA, ME, FPR techniques in cervical, rib, thoracic, lumbar, and sacral  areas  Patient tolerated the procedure well with improvement in symptoms  Patient given exercises, stretches and lifestyle modifications  See medications in patient instructions if given  Patient will follow up in 4-8 weeks    The above documentation has been reviewed and is accurate and complete Judi Saa, DO          Note: This dictation was prepared with Dragon dictation along with smaller phrase technology. Any transcriptional errors that result from this process are unintentional.

## 2023-07-06 ENCOUNTER — Ambulatory Visit: Payer: Medicare Other | Admitting: Family Medicine

## 2023-07-06 ENCOUNTER — Encounter: Payer: Self-pay | Admitting: Family Medicine

## 2023-07-06 VITALS — BP 102/62 | HR 70 | Ht 65.0 in

## 2023-07-06 DIAGNOSIS — M9908 Segmental and somatic dysfunction of rib cage: Secondary | ICD-10-CM | POA: Diagnosis not present

## 2023-07-06 DIAGNOSIS — M9903 Segmental and somatic dysfunction of lumbar region: Secondary | ICD-10-CM | POA: Diagnosis not present

## 2023-07-06 DIAGNOSIS — M9904 Segmental and somatic dysfunction of sacral region: Secondary | ICD-10-CM | POA: Diagnosis not present

## 2023-07-06 DIAGNOSIS — M9901 Segmental and somatic dysfunction of cervical region: Secondary | ICD-10-CM | POA: Diagnosis not present

## 2023-07-06 DIAGNOSIS — M51362 Other intervertebral disc degeneration, lumbar region with discogenic back pain and lower extremity pain: Secondary | ICD-10-CM | POA: Diagnosis not present

## 2023-07-06 DIAGNOSIS — M9902 Segmental and somatic dysfunction of thoracic region: Secondary | ICD-10-CM | POA: Diagnosis not present

## 2023-07-06 NOTE — Patient Instructions (Addendum)
 Good to see you! See you again in 2 months Dr. Ellison Hughs [facility]

## 2023-07-06 NOTE — Assessment & Plan Note (Signed)
 DDD, discussed HEP  Discussed home exercises  Continue to work on core strength  RTC in 6 weeks

## 2023-08-01 ENCOUNTER — Other Ambulatory Visit: Payer: Self-pay | Admitting: Internal Medicine

## 2023-08-18 ENCOUNTER — Telehealth: Payer: Self-pay

## 2023-08-18 DIAGNOSIS — H25013 Cortical age-related cataract, bilateral: Secondary | ICD-10-CM | POA: Diagnosis not present

## 2023-08-18 DIAGNOSIS — H2513 Age-related nuclear cataract, bilateral: Secondary | ICD-10-CM | POA: Diagnosis not present

## 2023-08-18 DIAGNOSIS — H5203 Hypermetropia, bilateral: Secondary | ICD-10-CM | POA: Diagnosis not present

## 2023-08-18 DIAGNOSIS — H353121 Nonexudative age-related macular degeneration, left eye, early dry stage: Secondary | ICD-10-CM | POA: Diagnosis not present

## 2023-08-18 NOTE — Telephone Encounter (Signed)
 Jennifer [name]

## 2023-08-18 NOTE — Telephone Encounter (Signed)
 Pt ready for scheduling for PROLIA  on or after : 09/18/23  Option# 1: Buy/Bill (Office supplied medication)  Out-of-pocket cost due at time of clinic visit: $0  Number of injection/visits approved: ---  Primary: MEDICARE Prolia  co-insurance: 0% Admin fee co-insurance: 0%  Secondary: AETNA-MEDSUP Prolia  co-insurance:  Admin fee co-insurance:   Medical Benefit Details: Date Benefits were checked: 08/18/23 Deductible: $257 Met of $257 Required/ Coinsurance: 0%/ Admin Fee: 0%  Prior Auth: N/A PA# Expiration Date:   # of doses approved: ----------------------------------------------------------------------- Option# 2- Med Obtained from pharmacy:  Pharmacy benefit: Copay $--- (Paid to pharmacy) Admin Fee: --- (Pay at clinic)  Prior Auth: --- PA# Expiration Date:   # of doses approved:   If patient wants fill through the pharmacy benefit please send prescription to:  --- , and include estimated need by date in rx notes. Pharmacy will ship medication directly to the office.  Patient NOT eligible for Prolia  Copay Card. Copay Card can make patient's cost as little as $25. Link to apply: [email or web address]://[email or web address]  ** This summary of benefits is an estimation of the patient's out-of-pocket cost. Exact cost may very based on individual plan coverage.

## 2023-08-18 NOTE — Telephone Encounter (Signed)
 Prolia  VOB initiated via [email or web address]  Next Prolia  inj DUE: 09/18/23

## 2023-08-29 NOTE — Progress Notes (Unsigned)
 [facility] [address] [place] [address] Phone: [phone] Subjective:   Jennifer [name], am serving as a scribe for Dr. Ronnell Coins.  I'm seeing this patient by the request  of:  Colene Dauphin, MD  CC: Back and neck pain follow-up  BMW:[id number]  Jennifer [name] is a 78 y.o. female coming in with complaint of back and neck pain. OMT on 07/06/2023. Patient states that she is the same as last visit.  Patient states has been trying to be active.  Continuing to do some weight training when possible.  Medications patient has been prescribed:   Taking:         Reviewed prior external information including notes and imaging from previsou exam, outside providers and external EMR if available.   As well as notes that were available from care everywhere and other healthcare systems.  Past medical history, social, surgical and family history all reviewed in electronic medical record.  No pertanent information unless stated regarding to the chief complaint.   Past Medical History:  Diagnosis Date   Allergy    seasonal   Anemia    Arthritis    Cataract    forming   DJD of shoulder    Hyperlipidemia    Insomnia    Internal hemorrhoids    Tubular adenoma of colon    Vitamin D  deficiency     Allergies  Allergen Reactions   Paxlovid  [Nirmatrelvir -Ritonavir ] Rash   Latex Other (See Comments)    Patient preference  Sensitivity    Penicillins     REACTION: Childhood reaction     Review of Systems:  No headache, visual changes, nausea, vomiting, diarrhea, constipation, dizziness, abdominal pain, skin rash, fevers, chills, night sweats, weight loss, swollen lymph nodes, body aches, joint swelling, chest pain, shortness of breath, mood changes. POSITIVE muscle aches  Objective  Blood pressure 92/62, pulse 66, height 5\' 5"  (1.651 m), SpO2 98%.   General: No apparent distress alert and oriented x3 mood and affect normal, dressed  appropriately.  HEENT: Pupils equal, extraocular movements intact  Respiratory: Patient's speak in full sentences and does not appear short of breath  Cardiovascular: No lower extremity edema, non tender, no erythema  Gait relatively normal MSK:  Back does have some scoliosis noted.  Difficulty with movement of the shoulder that is fairly significant of the right shoulder.  Atrophy of the shoulder girdle noted right greater than left.  Neck does have some limited range of motion  Osteopathic findings  C2 flexed rotated and side bent right C3 flexed rotated and side bent left C6 flexed rotated and side bent right T3 extended rotated and side bent right inhaled rib T5 extended rotated and side bent right with inhaled rib L2 flexed rotated and side bent right Sacrum right on right     Assessment and Plan:  Degenerative disc disease, lumbar Significant arthritis but doing relatively well.  Patient wants to still avoid anything such as medications if possible.  Discussed icing regimen and home exercises.  Follow-up again in 6 to 8 weeks responding well to osteopathic manipulation.  Osteoporosis Continue current treatment.  Due again in February 2026    Nonallopathic problems  Decision today to treat with OMT was based on Physical Exam  After verbal consent patient was treated with  ME, FPR techniques in cervical, rib, thoracic, lumbar, and sacral  areas  Patient tolerated the procedure well with improvement in symptoms  Patient given exercises,  stretches and lifestyle modifications  See medications in patient instructions if given  Patient will follow up in 6-8 weeks     The above documentation has been reviewed and is accurate and complete Jolanta Cabeza M Keayra Graham, DO         Note: This dictation was prepared with Dragon dictation along with smaller phrase technology. Any transcriptional errors that result from this process are unintentional.

## 2023-08-30 ENCOUNTER — Encounter: Payer: Self-pay | Admitting: Family Medicine

## 2023-08-30 ENCOUNTER — Ambulatory Visit: Admitting: Family Medicine

## 2023-08-30 VITALS — BP 92/62 | HR 66 | Ht 65.0 in

## 2023-08-30 DIAGNOSIS — M9908 Segmental and somatic dysfunction of rib cage: Secondary | ICD-10-CM

## 2023-08-30 DIAGNOSIS — M51362 Other intervertebral disc degeneration, lumbar region with discogenic back pain and lower extremity pain: Secondary | ICD-10-CM

## 2023-08-30 DIAGNOSIS — M9902 Segmental and somatic dysfunction of thoracic region: Secondary | ICD-10-CM

## 2023-08-30 DIAGNOSIS — M9903 Segmental and somatic dysfunction of lumbar region: Secondary | ICD-10-CM

## 2023-08-30 DIAGNOSIS — M810 Age-related osteoporosis without current pathological fracture: Secondary | ICD-10-CM

## 2023-08-30 DIAGNOSIS — M9901 Segmental and somatic dysfunction of cervical region: Secondary | ICD-10-CM | POA: Diagnosis not present

## 2023-08-30 DIAGNOSIS — M9904 Segmental and somatic dysfunction of sacral region: Secondary | ICD-10-CM

## 2023-08-30 NOTE — Assessment & Plan Note (Signed)
 Significant arthritis but doing relatively well.  Patient wants to still avoid anything such as medications if possible.  Discussed icing regimen and home exercises.  Follow-up again in 6 to 8 weeks responding well to osteopathic manipulation.

## 2023-08-30 NOTE — Patient Instructions (Signed)
Good to see you! See you again in 6-8 weeks 

## 2023-08-30 NOTE — Assessment & Plan Note (Signed)
 Continue current treatment.  Due again in February 2026

## 2023-09-07 ENCOUNTER — Telehealth: Payer: Self-pay | Admitting: Internal Medicine

## 2023-09-07 NOTE — Telephone Encounter (Signed)
 PT is calling to find out if this appointment for tomorrow is necessary. She just had her first "real" BM yesterday and feels a lot better but she doesn't want to waste an appointment that can be beneficial to someone else especially if it can be resolved with a phone call with the nurse. Please advise.

## 2023-09-07 NOTE — Telephone Encounter (Signed)
 I spoke to pt and informed her that if she wanted to cancel her appt that is ultimately her decision. I did inform pt that I recommend she keep the appt to discuss her current symptoms and get advised by provider in case constipation should become problematic again. Pt agreeable and states she will keep appt.

## 2023-09-08 ENCOUNTER — Encounter: Payer: Self-pay | Admitting: Nurse Practitioner

## 2023-09-08 ENCOUNTER — Ambulatory Visit (INDEPENDENT_AMBULATORY_CARE_PROVIDER_SITE_OTHER): Admitting: Nurse Practitioner

## 2023-09-08 VITALS — BP 112/60 | HR 61 | Ht 65.0 in | Wt 125.0 lb

## 2023-09-08 DIAGNOSIS — K5900 Constipation, unspecified: Secondary | ICD-10-CM | POA: Diagnosis not present

## 2023-09-08 DIAGNOSIS — Z860101 Personal history of adenomatous and serrated colon polyps: Secondary | ICD-10-CM | POA: Diagnosis not present

## 2023-09-08 NOTE — Patient Instructions (Addendum)
 Take Benefiber 1 tablespoon by mouth daily.   Start Miralax 1 capful daily in 8 ounces of liquid at bedtime.  Contact office if constipation persists.   _______________________________________________________  If your blood pressure at your visit was 140/90 or greater, please contact your primary care physician to follow up on this.  _______________________________________________________  If you are age 77 or older, your body mass index should be between 23-30. Your Body mass index is 20.8 kg/m. If this is out of the aforementioned range listed, please consider follow up with your Primary Care Provider.  If you are age 43 or younger, your body mass index should be between 19-25. Your Body mass index is 20.8 kg/m. If this is out of the aformentioned range listed, please consider follow up with your Primary Care Provider.   ________________________________________________________  The [facility] providers would like to encourage you to use MYCHART to communicate with providers for non-urgent requests or questions.  Due to long hold times on the telephone, sending your provider a message by [facility] may be a faster and more efficient way to get a response.  Please allow 48 business hours for a response.  Please remember that this is for non-urgent requests.  _______________________________________________________

## 2023-09-08 NOTE — Progress Notes (Signed)
 09/08/2023 Jennifer [name] [id number] 03-01-1946   Chief Complaint: Constipation   History of Present Illness: Jennifer [name] is a 78 year old female with a past medical history of arthritis, hyperlipidemia, vitamin D  deficiency, colon polyps, rectal prolapse and internal hemorrhoids s/p banding 2017 and 2018. She is known by Dr. Bridgett Camps.  She presents today for further evaluation regarding constipation.  She reported passing hard balls of stool every few days for the past 3 weeks.  She went 3 days without passing a BM and she took MiraLAX and Colace for 3 days which resulted in passing a large amount of stool on Tuesday 09/05/2023. No further BM since then. She sometimes takes magnesium oxide 400 mg at bedtime.  She drinks 6 to 8 glasses of water daily.  She drinks Keefer, eats yogurt and a high-fiber diet most days.  She eats 1/2 bar of dark chocolate and she questions if that contributes to constipation.  No bloody or black stools.  No abdominal pain.  No nausea or vomiting.  No weight loss.  Her most recent colonoscopy was 04/05/2021 which identified one 5 mm polyp removed at the IC valve, path report showed benign colonic mucosa and no further colonoscopies were recommended due to age.     Latest Ref Rng & Units 03/06/2023    9:04 AM 03/07/2022    7:49 AM 05/24/2013    2:49 PM  CBC  WBC 4.0 - 10.5 K/uL 5.0  3.3  5.7   Hemoglobin 12.0 - 15.0 g/dL 72.5  36.6  44.0   Hematocrit 36.0 - 46.0 % 39.6  39.6  38.1   Platelets 150.0 - 400.0 K/uL 262.0  286.0  271.0        Latest Ref Rng & Units 03/06/2023    9:04 AM 03/07/2022    7:49 AM 05/24/2013    2:49 PM  CMP  Glucose 70 - 99 mg/dL 80  83  65   BUN 6 - 23 mg/dL 14  14  15    Creatinine 0.40 - 1.20 mg/dL 3.47  4.25  0.7   Sodium 135 - 145 mEq/L 136  138  137   Potassium 3.5 - 5.1 mEq/L 4.0  4.0  4.9   Chloride 96 - 112 mEq/L 102  103  103   CO2 19 - 32 mEq/L 26  27  24    Calcium  8.4 - 10.5 mg/dL 9.2  9.3  9.6   Total Protein 6.0  - 8.3 g/dL 6.6  6.8  6.7   Total Bilirubin 0.2 - 1.2 mg/dL 0.6  0.4  0.9   Alkaline Phos 39 - 117 U/L 59  77  72   AST 0 - 37 U/L 19  21  25    ALT 0 - 35 U/L 16  24  23      Colonoscopy 04/05/2021: - One 5 mm polyp at the ileocecal valve, removed with a cold snare. Resected and retrieved.  - Rectal prolapsed mucosa with associated inflammation.  - The examination was otherwise normal.  - N0 further colonoscopies recommended due to age  Surgical [P], colon, ileocecal valve, polyp (1) - COLONIC MUCOSA WITH NO FEATURES DIAGNOSTIC FOR A SPECIFIC TYPE OF MUCOSAL POLYP (NEGATIVE FOR ADENOMATOUS CHANGE AND POLYPOID EPITHELIAL SERRATION). - NEGATIVE FOR ACTIVE COLITIS. - NEGATIVE FOR MALIGNANCY  Colonoscopy 11/03/2015: - One 5 mm polyp at the ileocecal valve, removed with a cold snare. Resected and retrieved.  - Possible polypoid lesion versus prolapse change in the  distal rectum. Biopsied.  - Internal hemorrhoids.  - The examination was otherwise normal.  TUBULAR ADENOMA. - NO HIGH GRADE DYSPLASIA OR MALIGNANCY. 2. Surgical [P], distal rectum, polyp - PROLAPSE TYPE CHANGE WITH ULCERATION. - NO DYSPLASIA OR MALIGNANCY  Past Medical History:  Diagnosis Date   Allergy    seasonal   Anemia    Arthritis    Cataract    forming   DJD of shoulder    Hyperlipidemia    Insomnia    Internal hemorrhoids    Tubular adenoma of colon    Vitamin D  deficiency    Past Surgical History:  Procedure Laterality Date   COLONOSCOPY     FOOT SURGERY Left    FOOT SURGERY Right    HIATAL HERNIA REPAIR  04/18/1952   age 4   OOPHORECTOMY     POLYPECTOMY     SHOULDER ARTHROSCOPY Right    TOTAL ABDOMINAL HYSTERECTOMY W/ BILATERAL SALPINGOOPHORECTOMY  04/18/2005   WISDOM TOOTH EXTRACTION     as teenager   Current Outpatient Medications on File Prior to Visit  Medication Sig Dispense Refill   cholecalciferol (VITAMIN D3) 25 MCG (1000 UNIT) tablet      co-enzyme Q-10 30 MG capsule Take 30 mg by  mouth 3 (three) times a week.     magnesium oxide (MAG-OX) 400 MG tablet Take 400 mg by mouth 2 (two) times a week.     rosuvastatin  (CRESTOR ) 5 MG tablet TAKE 1 TABLET BY MOUTH 3 TIMES A WEEK. 39 tablet 2   b complex vitamins tablet Take 1 tablet by mouth 2 (two) times a week.  (Patient not taking: Reported on 09/08/2023)     omega-3 acid ethyl esters (LOVAZA) 1 G capsule Take 1 g by mouth as needed. Patient takes this medication when she remembers (Patient not taking: Reported on 09/08/2023)     triamcinolone  cream (KENALOG ) 0.1 % Apply 1 Application topically 2 (two) times daily. (Patient not taking: Reported on 09/08/2023) 30 g 0   Current Facility-Administered Medications on File Prior to Visit  Medication Dose Route Frequency Provider Last Rate Last Admin   [START ON 09/18/2023] denosumab  (PROLIA ) injection 60 mg  60 mg Subcutaneous Once Colene Dauphin, MD       Allergies  Allergen Reactions   Paxlovid  [Nirmatrelvir -Ritonavir ] Rash   Latex Other (See Comments)    Patient preference  Sensitivity    Penicillins     REACTION: Childhood reaction  Current Medications, Allergies, Past Medical History, Past Surgical History, Family History and Social History were reviewed in [employer] record.  Review of Systems:   Constitutional: Negative for fever, sweats, chills or weight loss.  Respiratory: Negative for shortness of breath.   Cardiovascular: Negative for chest pain, palpitations and leg swelling.  Gastrointestinal: See HPI.  Musculoskeletal: Negative for back pain or muscle aches.  Neurological: Negative for dizziness, headaches or paresthesias.   Physical Exam: BP 112/60   Pulse 61   Ht 5\' 5"  (1.651 m)   Wt 125 lb (56.7 kg)   BMI 20.80 kg/m   Wt Readings from Last 3 Encounters:  09/08/23 125 lb (56.7 kg)  05/25/23 127 lb (57.6 kg)  04/03/23 126 lb (57.2 kg)    General: 78 year old female in no acute distress. Head: Normocephalic and atraumatic. Eyes:  No scleral icterus. Conjunctiva pink . Ears: Normal auditory acuity. Mouth: Dentition intact. No ulcers or lesions.  Lungs: Clear throughout to auscultation. Heart: Regular rate and rhythm, no murmur.  Abdomen: Soft, nontender and nondistended. No masses or hepatomegaly. Normal bowel sounds x 4 quadrants.  Rectal: Deferred. Musculoskeletal: Symmetrical with no gross deformities. Extremities: No edema. Neurological: Alert oriented x 4. No focal deficits.  Psychological: Alert and cooperative. Normal mood and affect  Assessment and Recommendations:  78 year old female with constipation x 3 weeks, she took MiraLAX and Colace for 3 days which resulted in passing a large amount of stool on 5/20, no BM since then.  No associated nausea, vomiting or abdominal pain. Normal TSH level 02/2023.  Colonoscopy 03/2021 showed rectal prolapse and a 5 mm polyp was removed at the IC valve, path report showed benign colonic mucosa. - Drink 64 ounces of water daily - Dietary fiber as tolerated - Benefiber 1 tablespoon daily - MiraLAX nightly, may take twice daily if needed - Okay to take magnesium oxide 400 mg nightly as needed - Patient to contact office if constipation persists or worsens - I discussed scheduling CTAP if she has significant recurrent constipation  History of a tubular adenomatous polyp per colonoscopy 10/2015.  Colonoscopy 03/2021  a 5 mm polyp was removed at the IC valve, path report showed benign colonic mucosa. - No further colon polyp surveillance colonoscopies recommended due to age

## 2023-09-12 NOTE — Progress Notes (Signed)
 Addendum: Reviewed and agree with assessment and management plan. Asha Grumbine, Carie Caddy, MD

## 2023-09-26 ENCOUNTER — Other Ambulatory Visit: Payer: Self-pay | Admitting: Family Medicine

## 2023-09-26 DIAGNOSIS — L299 Pruritus, unspecified: Secondary | ICD-10-CM

## 2023-09-26 DIAGNOSIS — T50905A Adverse effect of unspecified drugs, medicaments and biological substances, initial encounter: Secondary | ICD-10-CM

## 2023-09-26 DIAGNOSIS — R21 Rash and other nonspecific skin eruption: Secondary | ICD-10-CM

## 2023-09-27 ENCOUNTER — Ambulatory Visit (INDEPENDENT_AMBULATORY_CARE_PROVIDER_SITE_OTHER)

## 2023-09-27 ENCOUNTER — Ambulatory Visit: Admitting: Internal Medicine

## 2023-09-27 VITALS — Ht 65.0 in | Wt 125.0 lb

## 2023-09-27 DIAGNOSIS — Z0000 Encounter for general adult medical examination without abnormal findings: Secondary | ICD-10-CM | POA: Diagnosis not present

## 2023-09-27 NOTE — Progress Notes (Signed)
 Subjective:   Jennifer [name] is a 78 y.o. who presents for a Medicare Wellness preventive visit.  As a reminder, Annual Wellness Visits don't include a physical exam, and some assessments may be limited, especially if this visit is performed virtually. We may recommend an in-person follow-up visit with your provider if needed.  Visit Complete: Virtual I connected with  Harold Mattes on 09/27/23 by a audio enabled telemedicine application and verified that I am speaking with the correct person using two identifiers.  Patient Location: Home  Provider Location: Home Office  I discussed the limitations of evaluation and management by telemedicine. The patient expressed understanding and agreed to proceed.  Vital Signs: Because this visit was a virtual/telehealth visit, some criteria may be missing or patient reported. Any vitals not documented were not able to be obtained and vitals that have been documented are patient reported.  VideoDeclined- This patient declined Librarian, academic. Therefore the visit was completed with audio only.  Persons Participating in Visit: Patient.  AWV Questionnaire: No: Patient Medicare AWV questionnaire was not completed prior to this visit.  Cardiac Risk Factors include: advanced age (>52men, >48 women);Other (see comment);dyslipidemia, Risk factor comments: Aortic atherosclerosis     Objective:     Today's Vitals   09/27/23 0754  Weight: 125 lb (56.7 kg)  Height: 5' 5 (1.651 m)   Body mass index is 20.8 kg/m.     09/27/2023    8:17 AM 09/19/2022    2:06 PM 08/30/2021    1:05 PM  Advanced Directives  Does Patient Have a Medical Advance Directive? Yes Yes Yes  Type of Estate agent of [place];Living will Healthcare Power of [place];Living will Living will;Healthcare Power of Attorney  Does patient want to make changes to medical advance directive? No - Patient declined No -  Patient declined No - Patient declined  Copy of Healthcare Power of Attorney in Chart? Yes - validated most recent copy scanned in chart (See row information) No - copy requested Yes - validated most recent copy scanned in chart (See row information)    Current Medications (verified) Outpatient Encounter Medications as of 09/27/2023  Medication Sig   ascorbic acid (VITAMIN C) 1000 MG tablet 500 mg.   b complex vitamins tablet Take 1 tablet by mouth 2 (two) times a week.   cholecalciferol (VITAMIN D3) 25 MCG (1000 UNIT) tablet    co-enzyme Q-10 30 MG capsule Take 30 mg by mouth 3 (three) times a week.   magnesium oxide (MAG-OX) 400 MG tablet Take 400 mg by mouth 2 (two) times a week.   Menaquinone-7 (VITAMIN K2 PO) Take by mouth.   Multiple Vitamins-Minerals (ICAPS AREDS 2 PO) Take by mouth.   Omega 3 1000 MG CAPS 1 capsule.   omega-3 acid ethyl esters (LOVAZA) 1 G capsule Take 1 g by mouth as needed. Patient takes this medication when she remembers   rosuvastatin  (CRESTOR ) 5 MG tablet TAKE 1 TABLET BY MOUTH 3 TIMES A WEEK.   triamcinolone  cream (KENALOG ) 0.1 % Apply 1 Application topically 2 (two) times daily.   Turmeric 500 MG CAPS 1 capsule.   Facility-Administered Encounter Medications as of 09/27/2023  Medication   denosumab  (PROLIA ) injection 60 mg    Allergies (verified) Paxlovid  [nirmatrelvir -ritonavir ], Latex, and Penicillins   History: Past Medical History:  Diagnosis Date   Allergy    seasonal   Anemia    Arthritis    Cataract    forming  DJD of shoulder    Hyperlipidemia    Insomnia    Internal hemorrhoids    Tubular adenoma of colon    Vitamin D  deficiency    Past Surgical History:  Procedure Laterality Date   COLONOSCOPY     FOOT SURGERY Left    FOOT SURGERY Right    HIATAL HERNIA REPAIR  04/18/1952   age 21   OOPHORECTOMY     POLYPECTOMY     SHOULDER ARTHROSCOPY Right    TOTAL ABDOMINAL HYSTERECTOMY W/ BILATERAL SALPINGOOPHORECTOMY  04/18/2005    WISDOM TOOTH EXTRACTION     as teenager   Family History  Problem Relation Age of Onset   Hypertension Mother    Breast cancer Mother    Heart disease Mother    Heart disease Father    Heart attack Father 90       deceased   Prostate cancer Maternal Uncle    Colon cancer Neg Hx    Colon polyps Neg Hx    Esophageal cancer Neg Hx    Rectal cancer Neg Hx    Stomach cancer Neg Hx    Social History   Socioeconomic History   Marital status: Married    Spouse name: Richard   Number of children: 2   Years of education: Not on file   Highest education level: Not on file  Occupational History   Occupation: RETIRED  Tobacco Use   Smoking status: Never   Smokeless tobacco: Never  Vaping Use   Vaping status: Never Used  Substance and Sexual Activity   Alcohol use: Yes    Alcohol/week: 0.0 standard drinks of alcohol    Comment: 3 per week   Drug use: No   Sexual activity: Yes  Other Topics Concern   Not on file  Social History Narrative   Lives with husband and 1 dog/2025   Social Drivers of Health   Financial Resource Strain: Low Risk  (09/27/2023)   Overall Financial Resource Strain (CARDIA)    Difficulty of Paying Living Expenses: Not hard at all  Food Insecurity: No Food Insecurity (09/27/2023)   Hunger Vital Sign    Worried About Running Out of Food in the Last Year: Never true    Ran Out of Food in the Last Year: Never true  Transportation Needs: No Transportation Needs (09/27/2023)   PRAPARE - Administrator, Civil Service (Medical): No    Lack of Transportation (Non-Medical): No  Physical Activity: Sufficiently Active (09/27/2023)   Exercise Vital Sign    Days of Exercise per Week: 6 days    Minutes of Exercise per Session: 60 min  Stress: No Stress Concern Present (09/27/2023)   [employer] of Occupational Health - Occupational Stress Questionnaire    Feeling of Stress : Only a little  Social Connections: Moderately Isolated (09/27/2023)   Social  Connection and Isolation Panel [NHANES]    Frequency of Communication with Friends and Family: More than three times a week    Frequency of Social Gatherings with Friends and Family: Three times a week    Attends Religious Services: Never    Active Member of Clubs or Organizations: No    Attends Banker Meetings: Never    Marital Status: Married    Tobacco Counseling Counseling given: Not Answered    Clinical Intake:  Pre-visit preparation completed: Yes  Pain : No/denies pain     BMI - recorded: 20.8 Nutritional Status: BMI of 19-24  Normal Nutritional Risks:  None Diabetes: No  No results found for: HGBA1C   How often do you need to have someone help you when you read instructions, pamphlets, or other written materials from your doctor or pharmacy?: 2 - Rarely  Interpreter Needed?: No  Information entered by :: Leaf [employer], RMA   Activities of Daily Living     09/27/2023    8:04 AM  In your present state of health, do you have any difficulty performing the following activities:  Hearing? 1  Comment weras hearing aides  Vision? 0  Difficulty concentrating or making decisions? 0  Walking or climbing stairs? 0  Dressing or bathing? 0  Doing errands, shopping? 0  Preparing Food and eating ? N  Using the Toilet? N  In the past six months, have you accidently leaked urine? N  Do you have problems with loss of bowel control? N  Managing your Medications? N  Managing your Finances? N  Housekeeping or managing your Housekeeping? N    Patient Care Team: Colene Dauphin, MD as PCP - General (Internal Medicine) Alvina Axon, MD as Consulting Physician (Ophthalmology) Ulysees Gander, DO as Consulting Physician (Sports Medicine)  I have updated your Care Teams any recent Medical Services you may have received from other providers in the past year.     Assessment:    This is a routine wellness examination for Parisha.  Hearing/Vision  screen Hearing Screening - Comments:: Wears hearing aides Vision Screening - Comments:: Wears eyeglasses/Dr. Terrall Ferraris   Goals Addressed   None    Depression Screen     09/27/2023    8:21 AM 03/06/2023    8:15 AM 03/06/2023    8:13 AM 09/19/2022    2:05 PM 03/04/2022    1:16 PM 08/30/2021    1:08 PM 03/02/2021    9:25 AM  PHQ 2/9 Scores  PHQ - 2 Score 0 0 0 0 0 0 0  PHQ- 9 Score 2 0         Fall Risk     09/27/2023    8:18 AM 03/06/2023    8:13 AM 09/19/2022    2:06 PM 03/04/2022    1:16 PM 08/30/2021    1:06 PM  Fall Risk   Falls in the past year? 0 0 0 0 0  Number falls in past yr: 0 0 0 0 0  Injury with Fall? 0 0 0 0 0  Risk for fall due to :  No Fall Risks No Fall Risks No Fall Risks No Fall Risks  Follow up Falls evaluation completed;Falls prevention discussed Falls evaluation completed Falls evaluation completed Falls evaluation completed Falls evaluation completed    MEDICARE RISK AT HOME:  Medicare Risk at Home Any stairs in or around the home?: Yes (outside) If so, are there any without handrails?: No Home free of loose throw rugs in walkways, pet beds, electrical cords, etc?: Yes Adequate lighting in your home to reduce risk of falls?: Yes Life alert?: No Use of a cane, walker or w/c?: No Grab bars in the bathroom?: Yes Shower chair or bench in shower?: Yes Elevated toilet seat or a handicapped toilet?: Yes  TIMED UP AND GO:  Was the test performed?  No  Cognitive Function: Declined/Normal: No cognitive concerns noted by patient or family. Patient alert, oriented, able to answer questions appropriately and recall recent events. No signs of memory loss or confusion.        09/19/2022    2:07 PM 08/30/2021  1:14 PM  6CIT Screen  What Year? 0 points 0 points  What month? 0 points 0 points  What time? 0 points 0 points  Count back from 20 0 points 0 points  Months in reverse 0 points 0 points  Repeat phrase 0 points 0 points  Total Score 0 points 0 points     Immunizations Immunization History  Administered Date(s) Administered   Fluad Quad(high Dose 65+) 04/14/2023   Fluzone Influenza virus vaccine,trivalent (IIV3), split virus 02/09/2018, 02/11/2019   Influenza Split 01/16/2017   Influenza, High Dose Seasonal PF 02/16/2017, 04/14/2023   Influenza-Unspecified 01/19/2020, 01/03/2021, 02/05/2022   Moderna Sars-Covid-2 Vaccination 02/19/2020, 12/24/2020   PFIZER(Purple Top)SARS-COV-2 Vaccination 05/24/2019, 06/18/2019   Pfizer Covid-19 Vaccine Bivalent Booster 29yrs & up 01/26/2022   Pfizer(Comirnaty)Fall Seasonal Vaccine 12 years and older 03/05/2023   Pneumococcal Conjugate-13 06/11/2014   Pneumococcal Polysaccharide-23 09/07/2012, 08/09/2016   Td 03/05/2009   Tdap 09/18/2017   Zoster Recombinant(Shingrix) 06/13/2017, 09/18/2017   Zoster, Live 06/11/2012, 12/25/2012, 09/18/2017    Screening Tests Health Maintenance  Topic Date Due   Zoster Vaccines- Shingrix (2 of 2) 11/13/2017   COVID-19 Vaccine (7 - 2024-25 season) 09/02/2023   INFLUENZA VACCINE  11/17/2023   DEXA SCAN  06/02/2024   Medicare Annual Wellness (AWV)  09/26/2024   DTaP/Tdap/Td (3 - Td or Tdap) 09/19/2027   Pneumonia Vaccine 35+ Years old  Completed   Hepatitis C Screening  Completed   HPV VACCINES  Aged Out   Meningococcal B Vaccine  Aged Out   Colonoscopy  Discontinued    Health Maintenance  Health Maintenance Due  Topic Date Due   Zoster Vaccines- Shingrix (2 of 2) 11/13/2017   COVID-19 Vaccine (7 - 2024-25 season) 09/02/2023   Health Maintenance Items Addressed: See Nurse Notes at the end of this note  Additional Screening:  Vision Screening: Recommended annual ophthalmology exams for early detection of glaucoma and other disorders of the eye. Would you like a referral to an eye doctor? No    Dental Screening: Recommended annual dental exams for proper oral hygiene  Community Resource Referral / Chronic Care Management: CRR required this visit?   No   CCM required this visit?  No   Plan:    I have personally reviewed and noted the following in the patient's chart:   Medical and social history Use of alcohol, tobacco or illicit drugs  Current medications and supplements including opioid prescriptions. Patient is not currently taking opioid prescriptions. Functional ability and status Nutritional status Physical activity Advanced directives List of other physicians Hospitalizations, surgeries, and ER visits in previous 12 months Vitals Screenings to include cognitive, depression, and falls Referrals and appointments  In addition, I have reviewed and discussed with patient certain preventive protocols, quality metrics, and best practice recommendations. A written personalized care plan for preventive services as well as general preventive health recommendations were provided to patient.   Kruz Chiu L Tandy Lewin, CMA   09/27/2023   After Visit Summary: (MyChart) Due to this being a telephonic visit, the after visit summary with patients personalized plan was offered to patient via MyChart   Notes: Nothing significant to report at this time.

## 2023-09-27 NOTE — Patient Instructions (Signed)
 Jennifer [name] , Thank you for taking time out of your busy schedule to complete your Annual Wellness Visit with me. I enjoyed our conversation and look forward to speaking with you again next year. I, as well as your care team,  appreciate your ongoing commitment to your health goals. Please review the following plan we discussed and let me know if I can assist you in the future. Your Game plan/ To Do List   Follow up Visits: Next Medicare AWV with our clinical staff: 09/27/2024.   Have you seen your provider in the last 6 months (3 months if uncontrolled diabetes)? Yes Next Office Visit with your provider: 03/07/2024.  Clinician Recommendations:  Aim for 30 minutes of exercise or brisk walking, 6-8 glasses of water, and 5 servings of fruits and vegetables each day. Keep up the good work.      This is a list of the screening recommended for you and due dates:  Health Maintenance  Topic Date Due   Zoster (Shingles) Vaccine (2 of 2) 11/13/2017   COVID-19 Vaccine (7 - 2024-25 season) 09/02/2023   Flu Shot  11/17/2023   DEXA scan (bone density measurement)  06/02/2024   Medicare Annual Wellness Visit  09/26/2024   DTaP/Tdap/Td vaccine (3 - Td or Tdap) 09/19/2027   Pneumonia Vaccine  Completed   Hepatitis C Screening  Completed   HPV Vaccine  Aged Out   Meningitis B Vaccine  Aged Out   Colon Cancer Screening  Discontinued    Advanced directives: (In Chart) A copy of your advanced directives are scanned into your chart should your provider ever need it. Advance Care Planning is important because it:  [x]  Makes sure you receive the medical care that is consistent with your values, goals, and preferences  [x]  It provides guidance to your family and loved ones and reduces their decisional burden about whether or not they are making the right decisions based on your wishes.  Follow the link provided in your after visit summary or read over the paperwork we have mailed to you to help you started  getting your Advance Directives in place. If you need assistance in completing these, please reach out to us  so that we can help you!  See attachments for Preventive Care and Fall Prevention Tips.

## 2023-09-28 ENCOUNTER — Ambulatory Visit

## 2023-09-28 DIAGNOSIS — M810 Age-related osteoporosis without current pathological fracture: Secondary | ICD-10-CM | POA: Diagnosis not present

## 2023-09-28 MED ORDER — DENOSUMAB 60 MG/ML ~~LOC~~ SOSY
60.0000 mg | PREFILLED_SYRINGE | Freq: Once | SUBCUTANEOUS | Status: AC
  Administered 2023-09-28: 60 mg via SUBCUTANEOUS

## 2023-09-30 ENCOUNTER — Encounter: Payer: Self-pay | Admitting: Internal Medicine

## 2023-10-02 NOTE — Progress Notes (Signed)
 Pt received prolia  on 09/30/2023 and responded well to vaccine there were no complication.

## 2023-10-09 NOTE — Progress Notes (Unsigned)
 [name] [address] [place] [address] Phone: [phone] Subjective:   LILLETTE Jennifer [name], am serving as a scribe for Dr. Arthea Claudene.  I'm seeing this patient by the request  of:  Geofm Glade PARAS, MD  CC: back and neck pain follow up   Jennifer [name]  Jennifer [name] is a 78 y.o. female coming in with complaint of back and neck pain. OMT 08/30/2023. Patient states that she is having L sided lower back pain. Does not feel she is at the point for injections but it is still present.   Medications patient has been prescribed: None  Taking:         Reviewed prior external information including notes and imaging from previsou exam, outside providers and external EMR if available.   As well as notes that were available from care everywhere and other healthcare systems.  Past medical history, social, surgical and family history all reviewed in electronic medical record.  No pertanent information unless stated regarding to the chief complaint.   Past Medical History:  Diagnosis Date   Allergy    seasonal   Anemia    Arthritis    Cataract    forming   DJD of shoulder    Hyperlipidemia    Insomnia    Internal hemorrhoids    Tubular adenoma of colon    Vitamin D  deficiency     Allergies  Allergen Reactions   Paxlovid  [Nirmatrelvir -Ritonavir ] Rash   Latex Other (See Comments)    Patient preference  Sensitivity    Penicillins     REACTION: Childhood reaction     Review of Systems:  No headache, visual changes, nausea, vomiting, diarrhea, constipation, dizziness, abdominal pain, skin rash, fevers, chills, night sweats, weight loss, swollen lymph nodes, body aches, joint swelling, chest pain, shortness of breath, mood changes. POSITIVE muscle aches  Objective  Blood pressure 128/72, pulse 78, height 5' 5 (1.651 m), weight 123 lb (55.8 kg), SpO2 98%.   General: No apparent distress alert and oriented x3 mood and affect  normal, dressed appropriately.  HEENT: Pupils equal, extraocular movements intact  Respiratory: Patient's speak in full sentences and does not appear short of breath  Cardiovascular: No lower extremity edema, non tender, no erythema  Gait MSK:  Back scoliosis noted, does have severe arthritis of the shoulder on right does have some loss lordosis also noted.  Significant tenderness noted in the right scapular area.  Osteopathic findings  C3 flexed rotated and side bent right C7 flexed rotated and side bent right T5 extended rotated and side bent right inhaled rib T9 extended rotated and side bent left L2 flexed rotated and side bent right Sacrum right on right    Assessment and Plan:  Degenerative disc disease, lumbar More of the lumbar spine.  Discussed the repetitive activities.  Discussed avoiding certain rotational component that can potentially play a role.  Discussed icing regimen of home exercises, increase activity slowly.    Nonallopathic problems  Decision today to treat with OMT was based on Physical Exam  After verbal consent patient was treated with  ME, FPR techniques in cervical, rib, thoracic, lumbar, and sacral  areas  Patient tolerated the procedure well with improvement in symptoms  Patient given exercises, stretches and lifestyle modifications  See medications in patient instructions if given  Patient will follow up in 4-8 weeks     The above documentation has been reviewed and is accurate and complete [name] M [name],  DO         Note: This dictation was prepared with Dragon dictation along with smaller phrase technology. Any transcriptional errors that result from this process are unintentional.

## 2023-10-12 ENCOUNTER — Ambulatory Visit: Admitting: Family Medicine

## 2023-10-12 ENCOUNTER — Encounter: Payer: Self-pay | Admitting: Family Medicine

## 2023-10-12 VITALS — BP 128/72 | HR 78 | Ht 65.0 in | Wt 123.0 lb

## 2023-10-12 DIAGNOSIS — M51362 Other intervertebral disc degeneration, lumbar region with discogenic back pain and lower extremity pain: Secondary | ICD-10-CM | POA: Diagnosis not present

## 2023-10-12 DIAGNOSIS — M9908 Segmental and somatic dysfunction of rib cage: Secondary | ICD-10-CM

## 2023-10-12 DIAGNOSIS — M9903 Segmental and somatic dysfunction of lumbar region: Secondary | ICD-10-CM

## 2023-10-12 DIAGNOSIS — M9902 Segmental and somatic dysfunction of thoracic region: Secondary | ICD-10-CM | POA: Diagnosis not present

## 2023-10-12 DIAGNOSIS — M9904 Segmental and somatic dysfunction of sacral region: Secondary | ICD-10-CM

## 2023-10-12 DIAGNOSIS — M9901 Segmental and somatic dysfunction of cervical region: Secondary | ICD-10-CM | POA: Diagnosis not present

## 2023-10-12 NOTE — Patient Instructions (Signed)
 Can do injections in shoulder if needed See me in 2 months

## 2023-10-12 NOTE — Assessment & Plan Note (Signed)
 More of the lumbar spine.  Discussed the repetitive activities.  Discussed avoiding certain rotational component that can potentially play a role.  Discussed icing regimen of home exercises, increase activity slowly.

## 2023-12-07 NOTE — Progress Notes (Signed)
 [name] JENI [name] [address] [place] [address] Phone: [phone] Subjective:   Jennifer [name], am serving as a scribe for Dr. Arthea [name].  I'm seeing this patient by the request  of:  Geofm Glade PARAS, MD  CC: Back pain, or upper neck pain.  YEP:Dlagzrupcz  Jennifer [name] is a 78 y.o. female coming in with complaint of back and neck pain. OMT on 10/12/2023. Patient states doing well. Having some discomfort everywhere. Feet have been giving her a problem lately  Medications patient has been prescribed:   Taking:         Reviewed prior external information including notes and imaging from previsou exam, outside providers and external EMR if available.   As well as notes that were available from care everywhere and other healthcare systems.  Past medical history, social, surgical and family history all reviewed in electronic medical record.  No pertanent information unless stated regarding to the chief complaint.   Past Medical History:  Diagnosis Date   Allergy    seasonal   Anemia    Arthritis    Cataract    forming   DJD of shoulder    Hyperlipidemia    Insomnia    Internal hemorrhoids    Tubular adenoma of colon    Vitamin D  deficiency     Allergies  Allergen Reactions   Paxlovid  [Nirmatrelvir -Ritonavir ] Rash   Latex Other (See Comments)    Patient preference  Sensitivity    Penicillins     REACTION: Childhood reaction     Review of Systems:  No headache, visual changes, nausea, vomiting, diarrhea, constipation, dizziness, abdominal pain, skin rash, fevers, chills, night sweats, weight loss, swollen lymph nodes, body aches, joint swelling, chest pain, shortness of breath, mood changes. POSITIVE muscle aches  Objective  Blood pressure 124/74, pulse (!) 58, height 5' 5 (1.651 m), weight 124 lb (56.2 kg), SpO2 99%.   General: No apparent distress alert and oriented x3 mood and affect normal, dressed appropriately.   HEENT: Pupils equal, extraocular movements intact  Respiratory: Patient's speak in full sentences and does not appear short of breath  Cardiovascular: No lower extremity edema, non tender, no erythema  Significant arthritic changes of multiple joints.  Patient does have atrophy of the left shoulder fairly significantly.  Patient's lower back feels tender to palpation mostly over the left sacroiliac joint.  Tightness noted  Osteopathic findings  T9 extended rotated and side bent left T11 extended rotated and side bent right L2 flexed rotated and side bent right Sacrum right on right       Assessment and Plan:  Degenerative disc disease, lumbar Exacerbation at this time.  Discussed with patient that icing regimen and home exercises, discussed the potential for repeating x-rays but will hold at this time.  Given metatarsal pads for the shoes at this moment.  Follow-up with me again in 6 to 8 weeks otherwise.    Nonallopathic problems  Decision today to treat with OMT was based on Physical Exam  After verbal consent patient was treated with HVLA, ME, FPR techniques in  thoracic, lumbar, and sacral  areas  Patient tolerated the procedure well with improvement in symptoms  Patient given exercises, stretches and lifestyle modifications  See medications in patient instructions if given  Patient will follow up in 4-8 weeks    The above documentation has been reviewed and is accurate and complete Jennifer [name] M Jennifer Hundertmark, DO  Note: This dictation was prepared with Dragon dictation along with smaller phrase technology. Any transcriptional errors that result from this process are unintentional.

## 2023-12-12 ENCOUNTER — Ambulatory Visit (INDEPENDENT_AMBULATORY_CARE_PROVIDER_SITE_OTHER): Admitting: Family Medicine

## 2023-12-12 ENCOUNTER — Encounter: Payer: Self-pay | Admitting: Family Medicine

## 2023-12-12 VITALS — BP 124/74 | HR 58 | Ht 65.0 in | Wt 124.0 lb

## 2023-12-12 DIAGNOSIS — M51362 Other intervertebral disc degeneration, lumbar region with discogenic back pain and lower extremity pain: Secondary | ICD-10-CM

## 2023-12-12 DIAGNOSIS — M9903 Segmental and somatic dysfunction of lumbar region: Secondary | ICD-10-CM | POA: Diagnosis not present

## 2023-12-12 DIAGNOSIS — M9902 Segmental and somatic dysfunction of thoracic region: Secondary | ICD-10-CM

## 2023-12-12 DIAGNOSIS — M9904 Segmental and somatic dysfunction of sacral region: Secondary | ICD-10-CM | POA: Diagnosis not present

## 2023-12-12 NOTE — Patient Instructions (Signed)
 Good to see you! Keep wearing good shoes See you again in 6 weeks

## 2023-12-12 NOTE — Assessment & Plan Note (Signed)
 Exacerbation at this time.  Discussed with patient that icing regimen and home exercises, discussed the potential for repeating x-rays but will hold at this time.  Given metatarsal pads for the shoes at this moment.  Follow-up with me again in 6 to 8 weeks otherwise.

## 2024-01-23 NOTE — Progress Notes (Unsigned)
 [name] [address] [place] [address] Phone: [phone] Subjective:   Jennifer [name], am serving as a scribe for Dr. Arthea Claudene.  I'm seeing this patient by the request  of:  Geofm Glade PARAS, MD  CC: Back and neck pain follow-up  YEP:Dlagzrupcz  Jennifer [name] is a 78 y.o. female coming in with complaint of back and neck pain. OMT on 12/12/2023. Patient states that she is the same as last visit.   Medications patient has been prescribed:   Taking:         Reviewed prior external information including notes and imaging from previsou exam, outside providers and external EMR if available.   As well as notes that were available from care everywhere and other healthcare systems.  Past medical history, social, surgical and family history all reviewed in electronic medical record.  No pertanent information unless stated regarding to the chief complaint.   Past Medical History:  Diagnosis Date   Allergy    seasonal   Anemia    Arthritis    Cataract    forming   DJD of shoulder    Hyperlipidemia    Insomnia    Internal hemorrhoids    Tubular adenoma of colon    Vitamin D  deficiency     Allergies  Allergen Reactions   Paxlovid  [Nirmatrelvir -Ritonavir ] Rash   Latex Other (See Comments)    Patient preference  Sensitivity    Penicillins     REACTION: Childhood reaction     Review of Systems:  No headache, visual changes, nausea, vomiting, diarrhea, constipation, dizziness, abdominal pain, skin rash, fevers, chills, night sweats, weight loss, swollen lymph nodes, body aches, joint swelling, chest pain, shortness of breath, mood changes. POSITIVE muscle aches  Objective  Blood pressure 118/82, pulse 67, height 5' 5 (1.651 m), weight 125 lb (56.7 kg), SpO2 98%.   General: No apparent distress alert and oriented x3 mood and affect normal, dressed appropriately.  HEENT: Pupils equal, extraocular movements intact   Respiratory: Patient's speak in full sentences and does not appear short of breath  Cardiovascular: No lower extremity edema, non tender, no erythema  Arthritic changes of multiple joints noted.  Arthritic changes noted of the right shoulder with some atrophy noted.  Stable range of motion.  Osteopathic findings  C3 flexed rotated and side bent right C7 flexed rotated and side bent right T3 extended rotated and side bent right inhaled rib L4 flexed rotated and side bent left Sacrum right on right       Assessment and Plan:  Degenerative disc disease, lumbar Degenerative disc disease.  Continuing to respond relatively well though to osteopathic manipulation.  Discussed icing regimen and home exercises.  Follow-up again in 6 to 12 weeks    Nonallopathic problems  Decision today to treat with OMT was based on Physical Exam  After verbal consent patient was treated with  ME, FPR techniques in cervical, rib, thoracic, lumbar, and sacral  areas  Patient tolerated the procedure well with improvement in symptoms  Patient given exercises, stretches and lifestyle modifications  See medications in patient instructions if given  Patient will follow up in 4-8 weeks     The above documentation has been reviewed and is accurate and complete Inocente Krach M Lashawn Orrego, DO         Note: This dictation was prepared with Dragon dictation along with smaller phrase technology. Any transcriptional errors that result from this process are unintentional.

## 2024-01-25 ENCOUNTER — Ambulatory Visit (INDEPENDENT_AMBULATORY_CARE_PROVIDER_SITE_OTHER): Admitting: Family Medicine

## 2024-01-25 ENCOUNTER — Encounter: Payer: Self-pay | Admitting: Family Medicine

## 2024-01-25 VITALS — BP 118/82 | HR 67 | Ht 65.0 in | Wt 125.0 lb

## 2024-01-25 DIAGNOSIS — M9908 Segmental and somatic dysfunction of rib cage: Secondary | ICD-10-CM | POA: Diagnosis not present

## 2024-01-25 DIAGNOSIS — M9903 Segmental and somatic dysfunction of lumbar region: Secondary | ICD-10-CM | POA: Diagnosis not present

## 2024-01-25 DIAGNOSIS — M9902 Segmental and somatic dysfunction of thoracic region: Secondary | ICD-10-CM

## 2024-01-25 DIAGNOSIS — M9901 Segmental and somatic dysfunction of cervical region: Secondary | ICD-10-CM

## 2024-01-25 DIAGNOSIS — M9904 Segmental and somatic dysfunction of sacral region: Secondary | ICD-10-CM | POA: Diagnosis not present

## 2024-01-25 DIAGNOSIS — M51362 Other intervertebral disc degeneration, lumbar region with discogenic back pain and lower extremity pain: Secondary | ICD-10-CM | POA: Diagnosis not present

## 2024-01-25 NOTE — Assessment & Plan Note (Signed)
 Degenerative disc disease.  Continuing to respond relatively well though to osteopathic manipulation.  Discussed icing regimen and home exercises.  Follow-up again in 6 to 12 weeks

## 2024-02-17 DIAGNOSIS — Z23 Encounter for immunization: Secondary | ICD-10-CM | POA: Diagnosis not present

## 2024-02-26 ENCOUNTER — Encounter: Payer: Self-pay | Admitting: Internal Medicine

## 2024-02-28 ENCOUNTER — Telehealth: Payer: Self-pay

## 2024-02-28 NOTE — Telephone Encounter (Signed)
 Prolia  VOB initiated via [email or web address]  Next Prolia  inj DUE: 03/29/24

## 2024-02-29 NOTE — Telephone Encounter (Signed)
 Pt ready for scheduling for PROLIA  on or after : 03/29/24  Option# 1: Buy/Bill (Office supplied medication)  Out-of-pocket cost due at time of clinic visit: $0  Number of injection/visits approved: ---  Primary: MEDICARE Prolia  co-insurance: 0% Admin fee co-insurance: 0%  Secondary: [employer]-MED[employer]P Prolia  co-insurance:  Admin fee co-insurance:   Medical Benefit Details: Date Benefits were checked: 02/28/24 Deductible: $257 Met of $257 Required/ Coinsurance: 0%/ Admin Fee: 0%  Prior Auth: N/A PA# Expiration Date:   # of doses approved: ----------------------------------------------------------------------- Option# 2- Med Obtained from pharmacy:  Pharmacy benefit: Copay $--- (Paid to pharmacy) Admin Fee: --- (Pay at clinic)  Prior Auth: --- PA# Expiration Date:   # of doses approved:   If patient wants fill through the pharmacy benefit please send prescription to: ---, and include estimated need by date in rx notes. Pharmacy will ship medication directly to the office.  Patient NOT eligible for Prolia  Copay Card. Copay Card can make patient's cost as little as $25. Link to apply: [email or web address]://[email or web address]  ** This summary of benefits is an estimation of the patient's out-of-pocket cost. Exact cost may very based on individual plan coverage.

## 2024-02-29 NOTE — Telephone Encounter (Signed)
 SABRA

## 2024-03-06 ENCOUNTER — Encounter: Payer: Self-pay | Admitting: Internal Medicine

## 2024-03-06 NOTE — Patient Instructions (Addendum)
      Blood work was ordered.       Medications changes include :   None     Return in about 1 year (around 03/07/2025) for follow up, Schedule DEXA-[facility].

## 2024-03-06 NOTE — Progress Notes (Unsigned)
 Subjective:    Patient ID: Jennifer [name], female    DOB: 09-29-1945, 78 y.o.   MRN: [id number]     HPI Jennifer [name] is here for follow up of her chronic medical problems.  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  She has allergies  but they are mild and does not take medications for it.   She is exercising - she does a variety of exercises.      Medications and allergies reviewed with patient and updated if appropriate.  Current Outpatient Medications on File Prior to Visit  Medication Sig Dispense Refill   b complex vitamins tablet Take 1 tablet by mouth 2 (two) times a week.     cholecalciferol (VITAMIN D3) 25 MCG (1000 UNIT) tablet      co-enzyme Q-10 30 MG capsule Take 30 mg by mouth 3 (three) times a week.     magnesium oxide (MAG-OX) 400 MG tablet Take 400 mg by mouth 2 (two) times a week.     Menaquinone-7 (VITAMIN K2 PO) Take 1 tablet by mouth 2 (two) times a week.     Omega 3 1000 MG CAPS Take 1 capsule by mouth 2 (two) times a week.     omega-3 acid ethyl esters (LOVAZA) 1 G capsule Take 1 g by mouth as needed. Patient takes this medication when she remembers     rosuvastatin  (CRESTOR ) 5 MG tablet TAKE 1 TABLET BY MOUTH 3 TIMES A WEEK. 39 tablet 2   Turmeric 500 MG CAPS Take 1 capsule by mouth 2 (two) times a week.     Multiple Vitamins-Minerals (ICAPS AREDS 2 PO) Take by mouth. (Patient not taking: Reported on 03/07/2024)     Current Facility-Administered Medications on File Prior to Visit  Medication Dose Route Frequency Provider Last Rate Last Admin   denosumab  (PROLIA ) injection 60 mg  60 mg Subcutaneous Once Geofm Glade PARAS, MD         Review of Systems  Constitutional:  Negative for fever.  Eyes:  Negative for visual disturbance.  Respiratory:  Negative for cough, shortness of breath and wheezing.   Cardiovascular:  Negative for chest pain, palpitations and leg swelling.  Gastrointestinal:  Positive for  constipation. Negative for abdominal pain, blood in stool and diarrhea.       No gerd  Neurological:  Negative for light-headedness and headaches.  Psychiatric/Behavioral:  Negative for dysphoric mood. The patient is not nervous/anxious.        Objective:   Vitals:   03/07/24 0753  BP: 120/78  Pulse: (!) 47  Temp: 98 F (36.7 C)  SpO2: 97%   BP Readings from Last 3 Encounters:  03/07/24 120/78  01/25/24 118/82  12/12/23 124/74   Wt Readings from Last 3 Encounters:  03/07/24 124 lb (56.2 kg)  01/25/24 125 lb (56.7 kg)  12/12/23 124 lb (56.2 kg)   Body mass index is 20.63 kg/m.    Physical Exam Constitutional:      General: She is not in acute distress.    Appearance: Normal appearance.  HENT:     Head: Normocephalic and atraumatic.  Eyes:     Conjunctiva/sclera: Conjunctivae normal.  Cardiovascular:     Rate and Rhythm: Normal rate and regular rhythm.     Heart sounds: Normal heart sounds.  Pulmonary:     Effort: Pulmonary effort is normal. No respiratory distress.     Breath sounds: Normal  breath sounds. No wheezing.  Abdominal:     General: There is no distension.     Palpations: Abdomen is soft.     Tenderness: There is no abdominal tenderness.  Musculoskeletal:     Cervical back: Neck supple.     Right lower leg: No edema.     Left lower leg: No edema.  Lymphadenopathy:     Cervical: No cervical adenopathy.  Skin:    General: Skin is warm and dry.     Findings: No rash.  Neurological:     Mental Status: She is alert. Mental status is at baseline.  Psychiatric:        Mood and Affect: Mood normal.        Behavior: Behavior normal.        Lab Results  Component Value Date   WBC 5.0 03/06/2023   HGB 13.5 03/06/2023   HCT 39.6 03/06/2023   PLT 262.0 03/06/2023   GLUCOSE 80 03/06/2023   CHOL 205 (H) 03/06/2023   TRIG 105.0 03/06/2023   HDL 75.20 03/06/2023   LDLDIRECT 131.6 05/24/2013   LDLCALC 109 (H) 03/06/2023   ALT 16 03/06/2023   AST  19 03/06/2023   NA 136 03/06/2023   K 4.0 03/06/2023   CL 102 03/06/2023   CREATININE 0.75 03/06/2023   BUN 14 03/06/2023   CO2 26 03/06/2023   TSH 1.43 03/06/2023     Assessment & Plan:    See Problem List for Assessment and Plan of chronic medical problems.

## 2024-03-07 ENCOUNTER — Ambulatory Visit: Payer: Self-pay | Admitting: Internal Medicine

## 2024-03-07 ENCOUNTER — Ambulatory Visit (INDEPENDENT_AMBULATORY_CARE_PROVIDER_SITE_OTHER): Admitting: Internal Medicine

## 2024-03-07 VITALS — BP 120/78 | HR 47 | Temp 98.0°F | Ht 65.0 in | Wt 124.0 lb

## 2024-03-07 DIAGNOSIS — M810 Age-related osteoporosis without current pathological fracture: Secondary | ICD-10-CM | POA: Diagnosis not present

## 2024-03-07 DIAGNOSIS — E7800 Pure hypercholesterolemia, unspecified: Secondary | ICD-10-CM

## 2024-03-07 DIAGNOSIS — E559 Vitamin D deficiency, unspecified: Secondary | ICD-10-CM | POA: Diagnosis not present

## 2024-03-07 LAB — LIPID PANEL
Cholesterol: 195 mg/dL (ref 0–200)
HDL: 79.7 mg/dL (ref >39.00)
LDL Cholesterol: 98 mg/dL (ref 0–99)
NonHDL: 115.07
Total CHOL/HDL Ratio: 2
Triglycerides: 87 mg/dL (ref 0.0–149.0)
VLDL: 17.4 mg/dL (ref 0.0–40.0)

## 2024-03-07 LAB — COMPREHENSIVE METABOLIC PANEL WITH GFR
ALT: 12 U/L (ref 0–35)
AST: 16 U/L (ref 0–37)
Albumin: 4.3 g/dL (ref 3.5–5.2)
Alkaline Phosphatase: 49 U/L (ref 39–117)
BUN: 16 mg/dL (ref 6–23)
CO2: 28 meq/L (ref 19–32)
Calcium: 9.4 mg/dL (ref 8.4–10.5)
Chloride: 103 meq/L (ref 96–112)
Creatinine, Ser: 0.7 mg/dL (ref 0.40–1.20)
GFR: 83.7 mL/min (ref >60.00)
Glucose, Bld: 82 mg/dL (ref 70–99)
Potassium: 4.4 meq/L (ref 3.5–5.1)
Sodium: 137 meq/L (ref 135–145)
Total Bilirubin: 0.7 mg/dL (ref 0.2–1.2)
Total Protein: 6.7 g/dL (ref 6.0–8.3)

## 2024-03-07 LAB — CBC
HCT: 38.9 % (ref 36.0–46.0)
Hemoglobin: 13.2 g/dL (ref 12.0–15.0)
MCHC: 33.8 g/dL (ref 30.0–36.0)
MCV: 91.8 fl (ref 78.0–100.0)
Platelets: 259 K/uL (ref 150.0–400.0)
RBC: 4.24 Mil/uL (ref 3.87–5.11)
RDW: 14 % (ref 11.5–15.5)
WBC: 4 K/uL (ref 4.0–10.5)

## 2024-03-07 LAB — TSH: TSH: 1.16 u[IU]/mL (ref 0.35–5.50)

## 2024-03-07 LAB — VITAMIN D 25 HYDROXY (VIT D DEFICIENCY, FRACTURES): VITD: 45.75 ng/mL (ref 30.00–100.00)

## 2024-03-07 NOTE — Assessment & Plan Note (Signed)
 Chronic Taking vitamin D daily Check vitamin D level

## 2024-03-07 NOTE — Assessment & Plan Note (Signed)
 Chronic DEXA due soon - ordered Stressed regular exercise Eating a high calcium  diet  Continue vitamin D  daily Check vitamin D  level Discussed risks of fracture Prolia  Q 6 months

## 2024-03-07 NOTE — Assessment & Plan Note (Signed)
 Chronic Regular exercise and healthy diet encouraged Check lipid panel, CMP, tsh Continue Crestor 5 mg 3 times weekly

## 2024-03-11 NOTE — Progress Notes (Signed)
 [name] [address] [place] [address] Phone: [phone] Subjective:   [name], am serving as a scribe for Dr. Arthea Claudene.  I'm seeing this patient by the request  of:  Geofm Glade PARAS, MD  CC: Back and neck pain follow-up  YEP:Dlagzrupcz  Tonda Wiederhold is a 78 y.o. female coming in with complaint of back and neck pain. OMT on 01/25/2024. Patient states R hip and lower back pain. Just had massage nd has gotten better. Pain is achy and uncomfortable, but not sharp. Back will have sharp pain sometimes.  Medications patient has been prescribed:   Taking:         Reviewed prior external information including notes and imaging from previsou exam, outside providers and external EMR if available.   As well as notes that were available from care everywhere and other healthcare systems.  Past medical history, social, surgical and family history all reviewed in electronic medical record.  No pertanent information unless stated regarding to the chief complaint.   Past Medical History:  Diagnosis Date   Allergy    seasonal   Anemia    Arthritis    Cataract    forming   DJD of shoulder    Hyperlipidemia    Insomnia    Internal hemorrhoids    Tubular adenoma of colon    Vitamin D  deficiency     Allergies  Allergen Reactions   Paxlovid  [Nirmatrelvir -Ritonavir ] Rash   Latex Other (See Comments)    Patient preference  Sensitivity    Penicillins     REACTION: Childhood reaction     Review of Systems:  No headache, visual changes, nausea, vomiting, diarrhea, constipation, dizziness, abdominal pain, skin rash, fevers, chills, night sweats, weight loss, swollen lymph nodes, body aches, joint swelling, chest pain, shortness of breath, mood changes. POSITIVE muscle aches  Objective  Blood pressure 112/64, pulse 64, height 5' 5 (1.651 m), weight 126 lb (57.2 kg), SpO2 99%.   General: No apparent distress alert and  oriented x3 mood and affect normal, dressed appropriately.  HEENT: Pupils equal, extraocular movements intact  Respiratory: Patient's speak in full sentences and does not appear short of breath  Cardiovascular: No lower extremity edema, non tender, no erythema  Back exam does have some degenerative scoliosis noted.  Severe tenderness to palpation over the greater trochanteric area and the gluteal tendon more distally.  Weakness noted with 4 out of 5 strength of the hip abductor compared to the contralateral side at 4+.  Deep tendon reflexes are intact.  Some limited extension of the back noted  Osteopathic findings  C6 flexed rotated and side bent left T3 extended rotated and side bent right inhaled rib T9 extended rotated and side bent left L2 flexed rotated and side bent right L5 flexed rotated and side right Sacrum right on right   Procedure: Real-time Ultrasound Guided Injection of gluteal tendon sheath Device: GE Logiq Q7 Ultrasound guided injection is preferred based studies that show increased duration, increased effect, greater accuracy, decreased procedural pain, increased response rate, and decreased cost with ultrasound guided versus blind injection.  Verbal informed consent obtained.  Time-out conducted.  Noted no overlying erythema, induration, or other signs of local infection.  Skin prepped in a sterile fashion.  Local anesthesia: Topical Ethyl chloride.  With sterile technique and under real time ultrasound guidance: With a 21-gauge 2 inch needle injected with 0.5 cc of 0.5% Marcaine and 1 cc of Kenalog   40 mg/mL Completed without difficulty  Pain immediately resolved suggesting accurate placement of the medication.  Advised to call if fevers/chills, erythema, induration, drainage, or persistent bleeding.  Images saved Impression: Technically successful ultrasound guided injection.    Assessment and Plan:  Gluteal tendinitis of right buttock Has had this pain  intermittently for years.  Seems to be more secondary to the tendinopathy and the greater trochanteric bursitis noted on ultrasound today.  Patient hopefully does respond well.  Discussed differential includes lumbar radiculopathy.  Follow-up with me again in 6 to 12 weeks.  Degenerative disc disease, lumbar Stable but can be contributing to some of the discomfort and pain as well.  Increase activity slowly.  Discussed icing regimen.  Will follow-up again in 6 to 12 weeks    Nonallopathic problems  Decision today to treat with OMT was based on Physical Exam  After verbal consent patient was treated with, ME, FPR techniques in cervical, rib, thoracic, lumbar, and sacral  areas  Patient tolerated the procedure well with improvement in symptoms  Patient given exercises, stretches and lifestyle modifications  See medications in patient instructions if given  Patient will follow up in 4-8 weeks    The above documentation has been reviewed and is accurate and complete Caleah Tortorelli M Lorenso Quirino, DO          Note: This dictation was prepared with Dragon dictation along with smaller phrase technology. Any transcriptional errors that result from this process are unintentional.

## 2024-03-18 ENCOUNTER — Encounter: Payer: Self-pay | Admitting: Family Medicine

## 2024-03-19 ENCOUNTER — Encounter: Payer: Self-pay | Admitting: Family Medicine

## 2024-03-19 ENCOUNTER — Ambulatory Visit: Admitting: Family Medicine

## 2024-03-19 ENCOUNTER — Other Ambulatory Visit: Payer: Self-pay

## 2024-03-19 VITALS — BP 112/64 | HR 64 | Ht 65.0 in | Wt 126.0 lb

## 2024-03-19 DIAGNOSIS — M7601 Gluteal tendinitis, right hip: Secondary | ICD-10-CM | POA: Diagnosis not present

## 2024-03-19 DIAGNOSIS — M9908 Segmental and somatic dysfunction of rib cage: Secondary | ICD-10-CM | POA: Diagnosis not present

## 2024-03-19 DIAGNOSIS — M9904 Segmental and somatic dysfunction of sacral region: Secondary | ICD-10-CM | POA: Diagnosis not present

## 2024-03-19 DIAGNOSIS — M9903 Segmental and somatic dysfunction of lumbar region: Secondary | ICD-10-CM | POA: Diagnosis not present

## 2024-03-19 DIAGNOSIS — M9902 Segmental and somatic dysfunction of thoracic region: Secondary | ICD-10-CM | POA: Diagnosis not present

## 2024-03-19 DIAGNOSIS — M51362 Other intervertebral disc degeneration, lumbar region with discogenic back pain and lower extremity pain: Secondary | ICD-10-CM | POA: Diagnosis not present

## 2024-03-19 DIAGNOSIS — M25551 Pain in right hip: Secondary | ICD-10-CM | POA: Diagnosis not present

## 2024-03-19 DIAGNOSIS — M9901 Segmental and somatic dysfunction of cervical region: Secondary | ICD-10-CM | POA: Diagnosis not present

## 2024-03-19 NOTE — Assessment & Plan Note (Signed)
 Stable but can be contributing to some of the discomfort and pain as well.  Increase activity slowly.  Discussed icing regimen.  Will follow-up again in 6 to 12 weeks

## 2024-03-19 NOTE — Patient Instructions (Addendum)
 Injection today Good to see you! Okay to start swimming on weekend Can Ice hip at night See you again in 6-8 weeks

## 2024-03-19 NOTE — Assessment & Plan Note (Signed)
 Has had this pain intermittently for years.  Seems to be more secondary to the tendinopathy and the greater trochanteric bursitis noted on ultrasound today.  Patient hopefully does respond well.  Discussed differential includes lumbar radiculopathy.  Follow-up with me again in 6 to 12 weeks.

## 2024-03-20 ENCOUNTER — Encounter: Payer: Self-pay | Admitting: Internal Medicine

## 2024-03-29 ENCOUNTER — Ambulatory Visit

## 2024-03-29 DIAGNOSIS — R5383 Other fatigue: Secondary | ICD-10-CM | POA: Insufficient documentation

## 2024-03-29 DIAGNOSIS — H8110 Benign paroxysmal vertigo, unspecified ear: Secondary | ICD-10-CM | POA: Insufficient documentation

## 2024-03-29 DIAGNOSIS — G479 Sleep disorder, unspecified: Secondary | ICD-10-CM | POA: Insufficient documentation

## 2024-03-29 DIAGNOSIS — M810 Age-related osteoporosis without current pathological fracture: Secondary | ICD-10-CM

## 2024-03-29 DIAGNOSIS — E2839 Other primary ovarian failure: Secondary | ICD-10-CM | POA: Insufficient documentation

## 2024-03-29 DIAGNOSIS — D225 Melanocytic nevi of trunk: Secondary | ICD-10-CM | POA: Insufficient documentation

## 2024-03-29 DIAGNOSIS — L821 Other seborrheic keratosis: Secondary | ICD-10-CM | POA: Insufficient documentation

## 2024-03-29 DIAGNOSIS — L814 Other melanin hyperpigmentation: Secondary | ICD-10-CM | POA: Insufficient documentation

## 2024-03-29 MED ORDER — DENOSUMAB 60 MG/ML ~~LOC~~ SOSY
60.0000 mg | PREFILLED_SYRINGE | Freq: Once | SUBCUTANEOUS | Status: AC
  Administered 2024-03-29: 60 mg via SUBCUTANEOUS

## 2024-03-29 NOTE — Progress Notes (Signed)
Pt was given Prolia injec w/o any complications.

## 2024-04-01 ENCOUNTER — Ambulatory Visit: Payer: Self-pay

## 2024-04-01 ENCOUNTER — Encounter: Payer: Self-pay | Admitting: Internal Medicine

## 2024-04-01 ENCOUNTER — Ambulatory Visit: Admitting: Family Medicine
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
## 2024-04-02 ENCOUNTER — Ambulatory Visit: Admitting: Internal Medicine

## 2024-04-02 DIAGNOSIS — M25551 Pain in right hip: Secondary | ICD-10-CM

## 2024-04-02 DIAGNOSIS — S060X0A Concussion without loss of consciousness, initial encounter: Secondary | ICD-10-CM | POA: Diagnosis not present

## 2024-04-02 DIAGNOSIS — F430 Acute stress reaction: Secondary | ICD-10-CM | POA: Diagnosis not present

## 2024-04-02 NOTE — Patient Instructions (Addendum)
       Medications changes include :   None        Return if symptoms worsen or fail to improve.

## 2024-04-19 ENCOUNTER — Encounter: Payer: Self-pay | Admitting: Family Medicine

## 2024-04-22 ENCOUNTER — Other Ambulatory Visit: Payer: Self-pay

## 2024-04-22 DIAGNOSIS — R102 Pelvic and perineal pain unspecified side: Secondary | ICD-10-CM

## 2024-04-30 NOTE — Progress Notes (Unsigned)
 " [name] [address] [place] [address] Phone: [phone] Subjective:   Jennifer [name], am serving as a scribe for Dr. Arthea Claudene.  I'm seeing this patient by the request  of:  Jennifer Glade PARAS, MD  CC: Right hip pain, right shoulder pain follow-up  YEP:Dlagzrupcz  Jennifer [name] is a 79 y.o. female coming in with complaint of back and neck pain. OMT 03/19/2024. Also f/u for R hip pain. MRI on 05/07/2024.  Patient states injection last time didn't help. Has been doing exercises and has been doing a lot better.  Medications patient has been prescribed: None  Taking:      Previous imaging include x-rays of the patient's lumbar spine that did show severe degenerative changes with a grade 1 anterior listhesis of L4-L5 as well as left-sided severe sacroiliac arthritis.   Reviewed prior external information including notes and imaging from previsou exam, outside providers and external EMR if available.   As well as notes that were available from care everywhere and other healthcare systems.  Past medical history, social, surgical and family history all reviewed in electronic medical record.  No pertanent information unless stated regarding to the chief complaint.   Past Medical History:  Diagnosis Date   Allergy    seasonal   Anemia    Arthritis    Cataract    forming   DJD of shoulder    Hyperlipidemia    Insomnia    Internal hemorrhoids    Tubular adenoma of colon    Vitamin D  deficiency     Allergies[1]   Review of Systems:  No headache, visual changes, nausea, vomiting, diarrhea, constipation, dizziness, abdominal pain, skin rash, fevers, chills, night sweats, weight loss, swollen lymph nodes, body aches, joint swelling, chest pain, shortness of breath, mood changes. POSITIVE muscle aches  Objective  Blood pressure 122/72, pulse 60, height 5' 5 (1.651 m), weight 127 lb (57.6 kg), SpO2 96%.   General: No apparent  distress alert and oriented x3 mood and affect normal, dressed appropriately.  HEENT: Pupils equal, extraocular movements intact  Respiratory: Patient's speak in full sentences and does not appear short of breath  Cardiovascular: No lower extremity edema, non tender, no erythema  Gait MSK:  Back arthritic changes noted.  Does have some loss lordosis noted.  Patient does have tightness noted with FABER test. Antalgic gait noted  Osteopathic findings  C3 flexed rotated and side bent right C5 flexed rotated and side bent right T4 extended rotated and side bent right inhaled rib T6 extended rotated and side bent right L1 flexed rotated and side bent right  L3 flexed rotated and side bent left Sacrum right on right     Assessment and Plan:  Gluteal tendinitis of right buttock Patient did have severe pain but at the moment seems to be doing a little bit better.  We discussed with patient and icing regimen and home exercises, discussed still following through with the advanced imaging if not completely gone by next week.  Patient is in agreement with the plan.  Follow-up with me again otherwise in 6 to 8 weeks.    Nonallopathic problems  Decision today to treat with OMT was based on Physical Exam  After verbal consent patient was treated with , ME, FPR techniques in cervical, rib, thoracic, lumbar, and sacral  areas  Patient tolerated the procedure well with improvement in symptoms  Patient given exercises, stretches and lifestyle modifications  See  medications in patient instructions if given  Patient will follow up in 4-8 weeks    The above documentation has been reviewed and is accurate and complete Arthea [name], DO          Note: This dictation was prepared with Dragon dictation along with smaller phrase technology. Any transcriptional errors that result from this process are unintentional.            [1]  Allergies Allergen Reactions   Paxlovid   [Nirmatrelvir -Ritonavir ] Rash   Latex Other (See Comments)    Patient preference  Sensitivity    Penicillins     REACTION: Childhood reaction   "

## 2024-05-01 ENCOUNTER — Ambulatory Visit: Admitting: Family Medicine

## 2024-05-01 ENCOUNTER — Encounter: Payer: Self-pay | Admitting: Family Medicine

## 2024-05-01 VITALS — BP 122/72 | HR 60 | Ht 65.0 in | Wt 127.0 lb

## 2024-05-01 DIAGNOSIS — M9903 Segmental and somatic dysfunction of lumbar region: Secondary | ICD-10-CM | POA: Diagnosis not present

## 2024-05-01 DIAGNOSIS — M9908 Segmental and somatic dysfunction of rib cage: Secondary | ICD-10-CM

## 2024-05-01 DIAGNOSIS — M9901 Segmental and somatic dysfunction of cervical region: Secondary | ICD-10-CM | POA: Diagnosis not present

## 2024-05-01 DIAGNOSIS — M7601 Gluteal tendinitis, right hip: Secondary | ICD-10-CM | POA: Diagnosis not present

## 2024-05-01 DIAGNOSIS — M9904 Segmental and somatic dysfunction of sacral region: Secondary | ICD-10-CM | POA: Diagnosis not present

## 2024-05-01 DIAGNOSIS — M9902 Segmental and somatic dysfunction of thoracic region: Secondary | ICD-10-CM | POA: Diagnosis not present

## 2024-05-01 NOTE — Assessment & Plan Note (Signed)
 Patient did have severe pain but at the moment seems to be doing a little bit better.  We discussed with patient and icing regimen and home exercises, discussed still following through with the advanced imaging if not completely gone by next week.  Patient is in agreement with the plan.  Follow-up with me again otherwise in 6 to 8 weeks.

## 2024-05-01 NOTE — Patient Instructions (Addendum)
 Good to see you! Metatarsal cookie could be beneficial If hip keeps improving you can move back MRI See you again in 6-8 weeks

## 2024-05-07 ENCOUNTER — Ambulatory Visit
Admission: RE | Admit: 2024-05-07 | Discharge: 2024-05-07 | Disposition: A | Source: Ambulatory Visit | Attending: Family Medicine | Admitting: Family Medicine

## 2024-05-07 DIAGNOSIS — R102 Pelvic and perineal pain unspecified side: Secondary | ICD-10-CM

## 2024-05-07 MED ORDER — GADOPICLENOL 0.5 MMOL/ML IV SOLN
6.0000 mL | Freq: Once | INTRAVENOUS | Status: AC | PRN
  Administered 2024-05-07: 6 mL via INTRAVENOUS

## 2024-05-08 ENCOUNTER — Ambulatory Visit: Admitting: Family Medicine

## 2024-05-09 ENCOUNTER — Ambulatory Visit: Payer: Self-pay | Admitting: Family Medicine

## 2024-05-21 ENCOUNTER — Other Ambulatory Visit: Payer: Self-pay | Admitting: Internal Medicine

## 2024-05-21 DIAGNOSIS — Z1231 Encounter for screening mammogram for malignant neoplasm of breast: Secondary | ICD-10-CM

## 2024-06-04 ENCOUNTER — Other Ambulatory Visit

## 2024-06-13 ENCOUNTER — Ambulatory Visit: Admitting: Family Medicine

## 2024-07-04 ENCOUNTER — Ambulatory Visit

## 2024-09-27 ENCOUNTER — Ambulatory Visit

## 2025-03-17 ENCOUNTER — Ambulatory Visit: Admitting: Internal Medicine
